# Patient Record
Sex: Female | Born: 1945 | Race: White | Hispanic: No | Smoking: Never smoker
Health system: Southern US, Community
[De-identification: ages and names within clinical notes are randomized; demographics above are authoritative.]

## PROBLEM LIST (undated history)

## (undated) DIAGNOSIS — K2100 Gastro-esophageal reflux disease with esophagitis, without bleeding: Secondary | ICD-10-CM

## (undated) DIAGNOSIS — F329 Major depressive disorder, single episode, unspecified: Secondary | ICD-10-CM

## (undated) DIAGNOSIS — R17 Unspecified jaundice: Secondary | ICD-10-CM

## (undated) DIAGNOSIS — C50411 Malignant neoplasm of upper-outer quadrant of right female breast: Secondary | ICD-10-CM

## (undated) DIAGNOSIS — Z8719 Personal history of other diseases of the digestive system: Secondary | ICD-10-CM

## (undated) DIAGNOSIS — E039 Hypothyroidism, unspecified: Secondary | ICD-10-CM

## (undated) DIAGNOSIS — F509 Eating disorder, unspecified: Secondary | ICD-10-CM

## (undated) DIAGNOSIS — M1990 Unspecified osteoarthritis, unspecified site: Secondary | ICD-10-CM

## (undated) DIAGNOSIS — Z8711 Personal history of peptic ulcer disease: Secondary | ICD-10-CM

## (undated) DIAGNOSIS — K859 Acute pancreatitis without necrosis or infection, unspecified: Secondary | ICD-10-CM

## (undated) DIAGNOSIS — Z9221 Personal history of antineoplastic chemotherapy: Secondary | ICD-10-CM

## (undated) DIAGNOSIS — F32A Depression, unspecified: Secondary | ICD-10-CM

## (undated) DIAGNOSIS — Z923 Personal history of irradiation: Secondary | ICD-10-CM

## (undated) HISTORY — DX: Major depressive disorder, single episode, unspecified: F32.9

## (undated) HISTORY — DX: Hypothyroidism, unspecified: E03.9

## (undated) HISTORY — DX: Acute pancreatitis without necrosis or infection, unspecified: K85.90

## (undated) HISTORY — PX: CHOLECYSTECTOMY: SHX55

## (undated) HISTORY — DX: Personal history of other diseases of the digestive system: Z87.19

## (undated) HISTORY — PX: BREAST LUMPECTOMY: SHX2

## (undated) HISTORY — PX: TONSILLECTOMY: SUR1361

## (undated) HISTORY — DX: Gastro-esophageal reflux disease with esophagitis: K21.0

## (undated) HISTORY — PX: THYROIDECTOMY: SHX17

## (undated) HISTORY — DX: Gastro-esophageal reflux disease with esophagitis, without bleeding: K21.00

## (undated) HISTORY — PX: EXPLORATORY LAPAROTOMY: SUR591

## (undated) HISTORY — PX: BREAST SURGERY: SHX581

## (undated) HISTORY — PX: MASTECTOMY: SHX3

## (undated) HISTORY — DX: Unspecified jaundice: R17

## (undated) HISTORY — DX: Eating disorder, unspecified: F50.9

## (undated) HISTORY — DX: Malignant neoplasm of upper-outer quadrant of right female breast: C50.411

## (undated) HISTORY — DX: Personal history of peptic ulcer disease: Z87.11

## (undated) HISTORY — DX: Unspecified osteoarthritis, unspecified site: M19.90

## (undated) HISTORY — DX: Depression, unspecified: F32.A

---

## 2007-05-08 DIAGNOSIS — K859 Acute pancreatitis without necrosis or infection, unspecified: Secondary | ICD-10-CM

## 2007-05-08 HISTORY — DX: Acute pancreatitis without necrosis or infection, unspecified: K85.90

## 2012-10-16 DIAGNOSIS — K219 Gastro-esophageal reflux disease without esophagitis: Secondary | ICD-10-CM | POA: Insufficient documentation

## 2012-10-16 DIAGNOSIS — F32A Depression, unspecified: Secondary | ICD-10-CM | POA: Insufficient documentation

## 2012-10-16 DIAGNOSIS — G894 Chronic pain syndrome: Secondary | ICD-10-CM | POA: Insufficient documentation

## 2012-10-16 DIAGNOSIS — L719 Rosacea, unspecified: Secondary | ICD-10-CM | POA: Insufficient documentation

## 2012-10-16 DIAGNOSIS — E039 Hypothyroidism, unspecified: Secondary | ICD-10-CM | POA: Insufficient documentation

## 2012-10-16 DIAGNOSIS — A6000 Herpesviral infection of urogenital system, unspecified: Secondary | ICD-10-CM | POA: Insufficient documentation

## 2013-05-14 DIAGNOSIS — F322 Major depressive disorder, single episode, severe without psychotic features: Secondary | ICD-10-CM | POA: Diagnosis not present

## 2013-05-21 DIAGNOSIS — F322 Major depressive disorder, single episode, severe without psychotic features: Secondary | ICD-10-CM | POA: Diagnosis not present

## 2013-05-28 DIAGNOSIS — F322 Major depressive disorder, single episode, severe without psychotic features: Secondary | ICD-10-CM | POA: Diagnosis not present

## 2013-05-29 DIAGNOSIS — R928 Other abnormal and inconclusive findings on diagnostic imaging of breast: Secondary | ICD-10-CM | POA: Diagnosis not present

## 2013-05-29 DIAGNOSIS — N63 Unspecified lump in unspecified breast: Secondary | ICD-10-CM | POA: Diagnosis not present

## 2013-06-04 DIAGNOSIS — N63 Unspecified lump in unspecified breast: Secondary | ICD-10-CM | POA: Diagnosis not present

## 2013-06-04 DIAGNOSIS — C50919 Malignant neoplasm of unspecified site of unspecified female breast: Secondary | ICD-10-CM | POA: Diagnosis not present

## 2013-06-11 DIAGNOSIS — C50419 Malignant neoplasm of upper-outer quadrant of unspecified female breast: Secondary | ICD-10-CM | POA: Diagnosis not present

## 2013-06-11 DIAGNOSIS — N63 Unspecified lump in unspecified breast: Secondary | ICD-10-CM | POA: Diagnosis not present

## 2013-06-12 DIAGNOSIS — C50919 Malignant neoplasm of unspecified site of unspecified female breast: Secondary | ICD-10-CM | POA: Diagnosis not present

## 2013-06-18 DIAGNOSIS — C50219 Malignant neoplasm of upper-inner quadrant of unspecified female breast: Secondary | ICD-10-CM | POA: Diagnosis not present

## 2013-06-18 DIAGNOSIS — F322 Major depressive disorder, single episode, severe without psychotic features: Secondary | ICD-10-CM | POA: Diagnosis not present

## 2013-06-25 DIAGNOSIS — N6489 Other specified disorders of breast: Secondary | ICD-10-CM | POA: Diagnosis not present

## 2013-06-25 DIAGNOSIS — C50919 Malignant neoplasm of unspecified site of unspecified female breast: Secondary | ICD-10-CM | POA: Diagnosis not present

## 2013-06-25 DIAGNOSIS — F322 Major depressive disorder, single episode, severe without psychotic features: Secondary | ICD-10-CM | POA: Diagnosis not present

## 2013-06-25 DIAGNOSIS — R928 Other abnormal and inconclusive findings on diagnostic imaging of breast: Secondary | ICD-10-CM | POA: Diagnosis not present

## 2013-07-02 DIAGNOSIS — F322 Major depressive disorder, single episode, severe without psychotic features: Secondary | ICD-10-CM | POA: Diagnosis not present

## 2013-07-03 DIAGNOSIS — F329 Major depressive disorder, single episode, unspecified: Secondary | ICD-10-CM | POA: Diagnosis not present

## 2013-07-03 DIAGNOSIS — C50919 Malignant neoplasm of unspecified site of unspecified female breast: Secondary | ICD-10-CM | POA: Diagnosis not present

## 2013-07-03 DIAGNOSIS — Z79899 Other long term (current) drug therapy: Secondary | ICD-10-CM | POA: Diagnosis not present

## 2013-07-03 DIAGNOSIS — Z452 Encounter for adjustment and management of vascular access device: Secondary | ICD-10-CM | POA: Diagnosis not present

## 2013-07-03 DIAGNOSIS — F3289 Other specified depressive episodes: Secondary | ICD-10-CM | POA: Diagnosis not present

## 2013-07-03 DIAGNOSIS — K219 Gastro-esophageal reflux disease without esophagitis: Secondary | ICD-10-CM | POA: Diagnosis not present

## 2013-07-03 DIAGNOSIS — C50419 Malignant neoplasm of upper-outer quadrant of unspecified female breast: Secondary | ICD-10-CM | POA: Diagnosis not present

## 2013-07-03 DIAGNOSIS — E039 Hypothyroidism, unspecified: Secondary | ICD-10-CM | POA: Diagnosis not present

## 2013-07-06 DIAGNOSIS — F329 Major depressive disorder, single episode, unspecified: Secondary | ICD-10-CM | POA: Diagnosis not present

## 2013-07-06 DIAGNOSIS — E860 Dehydration: Secondary | ICD-10-CM | POA: Diagnosis not present

## 2013-07-06 DIAGNOSIS — F3289 Other specified depressive episodes: Secondary | ICD-10-CM | POA: Diagnosis not present

## 2013-07-06 DIAGNOSIS — Z5111 Encounter for antineoplastic chemotherapy: Secondary | ICD-10-CM | POA: Diagnosis not present

## 2013-07-06 DIAGNOSIS — C50419 Malignant neoplasm of upper-outer quadrant of unspecified female breast: Secondary | ICD-10-CM | POA: Diagnosis not present

## 2013-07-06 DIAGNOSIS — IMO0002 Reserved for concepts with insufficient information to code with codable children: Secondary | ICD-10-CM | POA: Diagnosis not present

## 2013-07-06 DIAGNOSIS — E039 Hypothyroidism, unspecified: Secondary | ICD-10-CM | POA: Diagnosis not present

## 2013-07-09 DIAGNOSIS — C50419 Malignant neoplasm of upper-outer quadrant of unspecified female breast: Secondary | ICD-10-CM | POA: Diagnosis not present

## 2013-07-16 DIAGNOSIS — Z803 Family history of malignant neoplasm of breast: Secondary | ICD-10-CM | POA: Diagnosis not present

## 2013-07-17 DIAGNOSIS — C50419 Malignant neoplasm of upper-outer quadrant of unspecified female breast: Secondary | ICD-10-CM | POA: Diagnosis not present

## 2013-07-20 DIAGNOSIS — C50919 Malignant neoplasm of unspecified site of unspecified female breast: Secondary | ICD-10-CM | POA: Diagnosis not present

## 2013-07-23 DIAGNOSIS — F3289 Other specified depressive episodes: Secondary | ICD-10-CM | POA: Diagnosis not present

## 2013-07-23 DIAGNOSIS — Z5111 Encounter for antineoplastic chemotherapy: Secondary | ICD-10-CM | POA: Diagnosis not present

## 2013-07-23 DIAGNOSIS — Z9189 Other specified personal risk factors, not elsewhere classified: Secondary | ICD-10-CM | POA: Diagnosis not present

## 2013-07-23 DIAGNOSIS — E860 Dehydration: Secondary | ICD-10-CM | POA: Diagnosis not present

## 2013-07-23 DIAGNOSIS — IMO0002 Reserved for concepts with insufficient information to code with codable children: Secondary | ICD-10-CM | POA: Diagnosis not present

## 2013-07-23 DIAGNOSIS — C50419 Malignant neoplasm of upper-outer quadrant of unspecified female breast: Secondary | ICD-10-CM | POA: Diagnosis not present

## 2013-07-23 DIAGNOSIS — E039 Hypothyroidism, unspecified: Secondary | ICD-10-CM | POA: Diagnosis not present

## 2013-07-23 DIAGNOSIS — F329 Major depressive disorder, single episode, unspecified: Secondary | ICD-10-CM | POA: Diagnosis not present

## 2013-07-24 DIAGNOSIS — E039 Hypothyroidism, unspecified: Secondary | ICD-10-CM | POA: Diagnosis not present

## 2013-07-24 DIAGNOSIS — F3289 Other specified depressive episodes: Secondary | ICD-10-CM | POA: Diagnosis not present

## 2013-07-24 DIAGNOSIS — IMO0002 Reserved for concepts with insufficient information to code with codable children: Secondary | ICD-10-CM | POA: Diagnosis not present

## 2013-07-24 DIAGNOSIS — F329 Major depressive disorder, single episode, unspecified: Secondary | ICD-10-CM | POA: Diagnosis not present

## 2013-07-24 DIAGNOSIS — Z5111 Encounter for antineoplastic chemotherapy: Secondary | ICD-10-CM | POA: Diagnosis not present

## 2013-07-24 DIAGNOSIS — E860 Dehydration: Secondary | ICD-10-CM | POA: Diagnosis not present

## 2013-07-24 DIAGNOSIS — C50419 Malignant neoplasm of upper-outer quadrant of unspecified female breast: Secondary | ICD-10-CM | POA: Diagnosis not present

## 2013-07-30 DIAGNOSIS — E039 Hypothyroidism, unspecified: Secondary | ICD-10-CM | POA: Diagnosis not present

## 2013-07-30 DIAGNOSIS — R110 Nausea: Secondary | ICD-10-CM | POA: Diagnosis not present

## 2013-07-30 DIAGNOSIS — K121 Other forms of stomatitis: Secondary | ICD-10-CM | POA: Diagnosis not present

## 2013-07-30 DIAGNOSIS — Z5111 Encounter for antineoplastic chemotherapy: Secondary | ICD-10-CM | POA: Diagnosis not present

## 2013-07-30 DIAGNOSIS — E860 Dehydration: Secondary | ICD-10-CM | POA: Diagnosis not present

## 2013-07-30 DIAGNOSIS — F329 Major depressive disorder, single episode, unspecified: Secondary | ICD-10-CM | POA: Diagnosis not present

## 2013-07-30 DIAGNOSIS — IMO0002 Reserved for concepts with insufficient information to code with codable children: Secondary | ICD-10-CM | POA: Diagnosis not present

## 2013-07-30 DIAGNOSIS — K1230 Oral mucositis (ulcerative), unspecified: Secondary | ICD-10-CM | POA: Diagnosis not present

## 2013-07-30 DIAGNOSIS — F3289 Other specified depressive episodes: Secondary | ICD-10-CM | POA: Diagnosis not present

## 2013-07-30 DIAGNOSIS — C50419 Malignant neoplasm of upper-outer quadrant of unspecified female breast: Secondary | ICD-10-CM | POA: Diagnosis not present

## 2013-07-31 DIAGNOSIS — F322 Major depressive disorder, single episode, severe without psychotic features: Secondary | ICD-10-CM | POA: Diagnosis not present

## 2013-08-06 DIAGNOSIS — B373 Candidiasis of vulva and vagina: Secondary | ICD-10-CM | POA: Diagnosis not present

## 2013-08-06 DIAGNOSIS — IMO0002 Reserved for concepts with insufficient information to code with codable children: Secondary | ICD-10-CM | POA: Diagnosis not present

## 2013-08-06 DIAGNOSIS — B3731 Acute candidiasis of vulva and vagina: Secondary | ICD-10-CM | POA: Diagnosis not present

## 2013-08-06 DIAGNOSIS — T82898A Other specified complication of vascular prosthetic devices, implants and grafts, initial encounter: Secondary | ICD-10-CM | POA: Diagnosis not present

## 2013-08-06 DIAGNOSIS — C50419 Malignant neoplasm of upper-outer quadrant of unspecified female breast: Secondary | ICD-10-CM | POA: Diagnosis not present

## 2013-08-06 DIAGNOSIS — F322 Major depressive disorder, single episode, severe without psychotic features: Secondary | ICD-10-CM | POA: Diagnosis not present

## 2013-08-06 DIAGNOSIS — Z5111 Encounter for antineoplastic chemotherapy: Secondary | ICD-10-CM | POA: Diagnosis not present

## 2013-08-06 DIAGNOSIS — Z9189 Other specified personal risk factors, not elsewhere classified: Secondary | ICD-10-CM | POA: Diagnosis not present

## 2013-08-13 DIAGNOSIS — T82898A Other specified complication of vascular prosthetic devices, implants and grafts, initial encounter: Secondary | ICD-10-CM | POA: Diagnosis not present

## 2013-08-13 DIAGNOSIS — C50419 Malignant neoplasm of upper-outer quadrant of unspecified female breast: Secondary | ICD-10-CM | POA: Diagnosis not present

## 2013-08-13 DIAGNOSIS — B3731 Acute candidiasis of vulva and vagina: Secondary | ICD-10-CM | POA: Diagnosis not present

## 2013-08-13 DIAGNOSIS — B373 Candidiasis of vulva and vagina: Secondary | ICD-10-CM | POA: Diagnosis not present

## 2013-08-13 DIAGNOSIS — Z5111 Encounter for antineoplastic chemotherapy: Secondary | ICD-10-CM | POA: Diagnosis not present

## 2013-08-13 DIAGNOSIS — Z9189 Other specified personal risk factors, not elsewhere classified: Secondary | ICD-10-CM | POA: Diagnosis not present

## 2013-08-13 DIAGNOSIS — F322 Major depressive disorder, single episode, severe without psychotic features: Secondary | ICD-10-CM | POA: Diagnosis not present

## 2013-08-13 DIAGNOSIS — IMO0002 Reserved for concepts with insufficient information to code with codable children: Secondary | ICD-10-CM | POA: Diagnosis not present

## 2013-08-14 DIAGNOSIS — B373 Candidiasis of vulva and vagina: Secondary | ICD-10-CM | POA: Diagnosis not present

## 2013-08-14 DIAGNOSIS — C50419 Malignant neoplasm of upper-outer quadrant of unspecified female breast: Secondary | ICD-10-CM | POA: Diagnosis not present

## 2013-08-14 DIAGNOSIS — Z5111 Encounter for antineoplastic chemotherapy: Secondary | ICD-10-CM | POA: Diagnosis not present

## 2013-08-14 DIAGNOSIS — T82898A Other specified complication of vascular prosthetic devices, implants and grafts, initial encounter: Secondary | ICD-10-CM | POA: Diagnosis not present

## 2013-08-14 DIAGNOSIS — IMO0002 Reserved for concepts with insufficient information to code with codable children: Secondary | ICD-10-CM | POA: Diagnosis not present

## 2013-08-14 DIAGNOSIS — Z9189 Other specified personal risk factors, not elsewhere classified: Secondary | ICD-10-CM | POA: Diagnosis not present

## 2013-08-14 DIAGNOSIS — B3731 Acute candidiasis of vulva and vagina: Secondary | ICD-10-CM | POA: Diagnosis not present

## 2013-08-20 DIAGNOSIS — C50419 Malignant neoplasm of upper-outer quadrant of unspecified female breast: Secondary | ICD-10-CM | POA: Diagnosis not present

## 2013-08-20 DIAGNOSIS — B373 Candidiasis of vulva and vagina: Secondary | ICD-10-CM | POA: Diagnosis not present

## 2013-08-20 DIAGNOSIS — Z5111 Encounter for antineoplastic chemotherapy: Secondary | ICD-10-CM | POA: Diagnosis not present

## 2013-08-20 DIAGNOSIS — IMO0002 Reserved for concepts with insufficient information to code with codable children: Secondary | ICD-10-CM | POA: Diagnosis not present

## 2013-08-20 DIAGNOSIS — F322 Major depressive disorder, single episode, severe without psychotic features: Secondary | ICD-10-CM | POA: Diagnosis not present

## 2013-08-20 DIAGNOSIS — B3731 Acute candidiasis of vulva and vagina: Secondary | ICD-10-CM | POA: Diagnosis not present

## 2013-08-20 DIAGNOSIS — T82898A Other specified complication of vascular prosthetic devices, implants and grafts, initial encounter: Secondary | ICD-10-CM | POA: Diagnosis not present

## 2013-08-20 DIAGNOSIS — Z9189 Other specified personal risk factors, not elsewhere classified: Secondary | ICD-10-CM | POA: Diagnosis not present

## 2013-08-27 DIAGNOSIS — F322 Major depressive disorder, single episode, severe without psychotic features: Secondary | ICD-10-CM | POA: Diagnosis not present

## 2013-08-27 DIAGNOSIS — Z5111 Encounter for antineoplastic chemotherapy: Secondary | ICD-10-CM | POA: Diagnosis not present

## 2013-08-27 DIAGNOSIS — T82898A Other specified complication of vascular prosthetic devices, implants and grafts, initial encounter: Secondary | ICD-10-CM | POA: Diagnosis not present

## 2013-08-27 DIAGNOSIS — B3731 Acute candidiasis of vulva and vagina: Secondary | ICD-10-CM | POA: Diagnosis not present

## 2013-08-27 DIAGNOSIS — Z9189 Other specified personal risk factors, not elsewhere classified: Secondary | ICD-10-CM | POA: Diagnosis not present

## 2013-08-27 DIAGNOSIS — B373 Candidiasis of vulva and vagina: Secondary | ICD-10-CM | POA: Diagnosis not present

## 2013-08-27 DIAGNOSIS — C50419 Malignant neoplasm of upper-outer quadrant of unspecified female breast: Secondary | ICD-10-CM | POA: Diagnosis not present

## 2013-08-27 DIAGNOSIS — IMO0002 Reserved for concepts with insufficient information to code with codable children: Secondary | ICD-10-CM | POA: Diagnosis not present

## 2013-09-03 DIAGNOSIS — F322 Major depressive disorder, single episode, severe without psychotic features: Secondary | ICD-10-CM | POA: Diagnosis not present

## 2013-09-09 DIAGNOSIS — F322 Major depressive disorder, single episode, severe without psychotic features: Secondary | ICD-10-CM | POA: Diagnosis not present

## 2013-09-10 DIAGNOSIS — Z9189 Other specified personal risk factors, not elsewhere classified: Secondary | ICD-10-CM | POA: Diagnosis not present

## 2013-09-10 DIAGNOSIS — B373 Candidiasis of vulva and vagina: Secondary | ICD-10-CM | POA: Diagnosis not present

## 2013-09-10 DIAGNOSIS — B3731 Acute candidiasis of vulva and vagina: Secondary | ICD-10-CM | POA: Diagnosis not present

## 2013-09-10 DIAGNOSIS — C50419 Malignant neoplasm of upper-outer quadrant of unspecified female breast: Secondary | ICD-10-CM | POA: Diagnosis not present

## 2013-09-10 DIAGNOSIS — Z5111 Encounter for antineoplastic chemotherapy: Secondary | ICD-10-CM | POA: Diagnosis not present

## 2013-09-10 DIAGNOSIS — R21 Rash and other nonspecific skin eruption: Secondary | ICD-10-CM | POA: Diagnosis not present

## 2013-09-10 DIAGNOSIS — IMO0002 Reserved for concepts with insufficient information to code with codable children: Secondary | ICD-10-CM | POA: Diagnosis not present

## 2013-09-10 DIAGNOSIS — D709 Neutropenia, unspecified: Secondary | ICD-10-CM | POA: Diagnosis not present

## 2013-09-11 DIAGNOSIS — Z9189 Other specified personal risk factors, not elsewhere classified: Secondary | ICD-10-CM | POA: Diagnosis not present

## 2013-09-11 DIAGNOSIS — IMO0002 Reserved for concepts with insufficient information to code with codable children: Secondary | ICD-10-CM | POA: Diagnosis not present

## 2013-09-11 DIAGNOSIS — B3731 Acute candidiasis of vulva and vagina: Secondary | ICD-10-CM | POA: Diagnosis not present

## 2013-09-11 DIAGNOSIS — D709 Neutropenia, unspecified: Secondary | ICD-10-CM | POA: Diagnosis not present

## 2013-09-11 DIAGNOSIS — Z5111 Encounter for antineoplastic chemotherapy: Secondary | ICD-10-CM | POA: Diagnosis not present

## 2013-09-11 DIAGNOSIS — B373 Candidiasis of vulva and vagina: Secondary | ICD-10-CM | POA: Diagnosis not present

## 2013-09-11 DIAGNOSIS — C50419 Malignant neoplasm of upper-outer quadrant of unspecified female breast: Secondary | ICD-10-CM | POA: Diagnosis not present

## 2013-09-13 DIAGNOSIS — N390 Urinary tract infection, site not specified: Secondary | ICD-10-CM | POA: Diagnosis not present

## 2013-09-13 DIAGNOSIS — D72829 Elevated white blood cell count, unspecified: Secondary | ICD-10-CM | POA: Diagnosis not present

## 2013-09-13 DIAGNOSIS — K5900 Constipation, unspecified: Secondary | ICD-10-CM | POA: Diagnosis not present

## 2013-09-13 DIAGNOSIS — N949 Unspecified condition associated with female genital organs and menstrual cycle: Secondary | ICD-10-CM | POA: Diagnosis not present

## 2013-09-13 DIAGNOSIS — R109 Unspecified abdominal pain: Secondary | ICD-10-CM | POA: Diagnosis not present

## 2013-09-17 DIAGNOSIS — C50419 Malignant neoplasm of upper-outer quadrant of unspecified female breast: Secondary | ICD-10-CM | POA: Diagnosis not present

## 2013-09-17 DIAGNOSIS — Z5111 Encounter for antineoplastic chemotherapy: Secondary | ICD-10-CM | POA: Diagnosis not present

## 2013-09-17 DIAGNOSIS — Z9189 Other specified personal risk factors, not elsewhere classified: Secondary | ICD-10-CM | POA: Diagnosis not present

## 2013-09-17 DIAGNOSIS — IMO0002 Reserved for concepts with insufficient information to code with codable children: Secondary | ICD-10-CM | POA: Diagnosis not present

## 2013-09-17 DIAGNOSIS — F322 Major depressive disorder, single episode, severe without psychotic features: Secondary | ICD-10-CM | POA: Diagnosis not present

## 2013-09-17 DIAGNOSIS — B3731 Acute candidiasis of vulva and vagina: Secondary | ICD-10-CM | POA: Diagnosis not present

## 2013-09-17 DIAGNOSIS — B373 Candidiasis of vulva and vagina: Secondary | ICD-10-CM | POA: Diagnosis not present

## 2013-09-17 DIAGNOSIS — K5900 Constipation, unspecified: Secondary | ICD-10-CM | POA: Diagnosis not present

## 2013-09-17 DIAGNOSIS — D709 Neutropenia, unspecified: Secondary | ICD-10-CM | POA: Diagnosis not present

## 2013-09-23 DIAGNOSIS — IMO0002 Reserved for concepts with insufficient information to code with codable children: Secondary | ICD-10-CM | POA: Diagnosis not present

## 2013-09-23 DIAGNOSIS — Z9189 Other specified personal risk factors, not elsewhere classified: Secondary | ICD-10-CM | POA: Diagnosis not present

## 2013-09-23 DIAGNOSIS — D709 Neutropenia, unspecified: Secondary | ICD-10-CM | POA: Diagnosis not present

## 2013-09-23 DIAGNOSIS — B373 Candidiasis of vulva and vagina: Secondary | ICD-10-CM | POA: Diagnosis not present

## 2013-09-23 DIAGNOSIS — C50419 Malignant neoplasm of upper-outer quadrant of unspecified female breast: Secondary | ICD-10-CM | POA: Diagnosis not present

## 2013-09-23 DIAGNOSIS — Z5111 Encounter for antineoplastic chemotherapy: Secondary | ICD-10-CM | POA: Diagnosis not present

## 2013-09-23 DIAGNOSIS — B3731 Acute candidiasis of vulva and vagina: Secondary | ICD-10-CM | POA: Diagnosis not present

## 2013-09-30 DIAGNOSIS — B373 Candidiasis of vulva and vagina: Secondary | ICD-10-CM | POA: Diagnosis not present

## 2013-09-30 DIAGNOSIS — C50419 Malignant neoplasm of upper-outer quadrant of unspecified female breast: Secondary | ICD-10-CM | POA: Diagnosis not present

## 2013-09-30 DIAGNOSIS — IMO0002 Reserved for concepts with insufficient information to code with codable children: Secondary | ICD-10-CM | POA: Diagnosis not present

## 2013-09-30 DIAGNOSIS — B3731 Acute candidiasis of vulva and vagina: Secondary | ICD-10-CM | POA: Diagnosis not present

## 2013-09-30 DIAGNOSIS — D709 Neutropenia, unspecified: Secondary | ICD-10-CM | POA: Diagnosis not present

## 2013-09-30 DIAGNOSIS — Z5111 Encounter for antineoplastic chemotherapy: Secondary | ICD-10-CM | POA: Diagnosis not present

## 2013-09-30 DIAGNOSIS — Z9189 Other specified personal risk factors, not elsewhere classified: Secondary | ICD-10-CM | POA: Diagnosis not present

## 2013-10-01 DIAGNOSIS — R21 Rash and other nonspecific skin eruption: Secondary | ICD-10-CM | POA: Diagnosis not present

## 2013-10-01 DIAGNOSIS — B373 Candidiasis of vulva and vagina: Secondary | ICD-10-CM | POA: Diagnosis not present

## 2013-10-01 DIAGNOSIS — IMO0002 Reserved for concepts with insufficient information to code with codable children: Secondary | ICD-10-CM | POA: Diagnosis not present

## 2013-10-01 DIAGNOSIS — C50419 Malignant neoplasm of upper-outer quadrant of unspecified female breast: Secondary | ICD-10-CM | POA: Diagnosis not present

## 2013-10-01 DIAGNOSIS — D709 Neutropenia, unspecified: Secondary | ICD-10-CM | POA: Diagnosis not present

## 2013-10-01 DIAGNOSIS — B3731 Acute candidiasis of vulva and vagina: Secondary | ICD-10-CM | POA: Diagnosis not present

## 2013-10-01 DIAGNOSIS — Z5111 Encounter for antineoplastic chemotherapy: Secondary | ICD-10-CM | POA: Diagnosis not present

## 2013-10-01 DIAGNOSIS — Z9189 Other specified personal risk factors, not elsewhere classified: Secondary | ICD-10-CM | POA: Diagnosis not present

## 2013-10-08 DIAGNOSIS — B370 Candidal stomatitis: Secondary | ICD-10-CM | POA: Diagnosis not present

## 2013-10-08 DIAGNOSIS — Z5111 Encounter for antineoplastic chemotherapy: Secondary | ICD-10-CM | POA: Diagnosis not present

## 2013-10-08 DIAGNOSIS — B373 Candidiasis of vulva and vagina: Secondary | ICD-10-CM | POA: Diagnosis not present

## 2013-10-08 DIAGNOSIS — D709 Neutropenia, unspecified: Secondary | ICD-10-CM | POA: Diagnosis not present

## 2013-10-08 DIAGNOSIS — C50419 Malignant neoplasm of upper-outer quadrant of unspecified female breast: Secondary | ICD-10-CM | POA: Diagnosis not present

## 2013-10-08 DIAGNOSIS — B3731 Acute candidiasis of vulva and vagina: Secondary | ICD-10-CM | POA: Diagnosis not present

## 2013-10-08 DIAGNOSIS — IMO0002 Reserved for concepts with insufficient information to code with codable children: Secondary | ICD-10-CM | POA: Diagnosis not present

## 2013-10-21 DIAGNOSIS — F322 Major depressive disorder, single episode, severe without psychotic features: Secondary | ICD-10-CM | POA: Diagnosis not present

## 2013-10-22 DIAGNOSIS — Z5111 Encounter for antineoplastic chemotherapy: Secondary | ICD-10-CM | POA: Diagnosis not present

## 2013-10-22 DIAGNOSIS — M7500 Adhesive capsulitis of unspecified shoulder: Secondary | ICD-10-CM | POA: Diagnosis not present

## 2013-10-22 DIAGNOSIS — C50419 Malignant neoplasm of upper-outer quadrant of unspecified female breast: Secondary | ICD-10-CM | POA: Diagnosis not present

## 2013-10-22 DIAGNOSIS — IMO0002 Reserved for concepts with insufficient information to code with codable children: Secondary | ICD-10-CM | POA: Diagnosis not present

## 2013-10-23 DIAGNOSIS — L299 Pruritus, unspecified: Secondary | ICD-10-CM | POA: Diagnosis not present

## 2013-10-29 DIAGNOSIS — IMO0002 Reserved for concepts with insufficient information to code with codable children: Secondary | ICD-10-CM | POA: Diagnosis not present

## 2013-10-29 DIAGNOSIS — C50419 Malignant neoplasm of upper-outer quadrant of unspecified female breast: Secondary | ICD-10-CM | POA: Diagnosis not present

## 2013-10-29 DIAGNOSIS — Z5111 Encounter for antineoplastic chemotherapy: Secondary | ICD-10-CM | POA: Diagnosis not present

## 2013-11-12 DIAGNOSIS — B373 Candidiasis of vulva and vagina: Secondary | ICD-10-CM | POA: Diagnosis not present

## 2013-11-12 DIAGNOSIS — Z5111 Encounter for antineoplastic chemotherapy: Secondary | ICD-10-CM | POA: Diagnosis not present

## 2013-11-12 DIAGNOSIS — IMO0002 Reserved for concepts with insufficient information to code with codable children: Secondary | ICD-10-CM | POA: Diagnosis not present

## 2013-11-12 DIAGNOSIS — C50419 Malignant neoplasm of upper-outer quadrant of unspecified female breast: Secondary | ICD-10-CM | POA: Diagnosis not present

## 2013-11-12 DIAGNOSIS — B3731 Acute candidiasis of vulva and vagina: Secondary | ICD-10-CM | POA: Diagnosis not present

## 2013-11-12 DIAGNOSIS — R21 Rash and other nonspecific skin eruption: Secondary | ICD-10-CM | POA: Diagnosis not present

## 2013-11-19 DIAGNOSIS — C50419 Malignant neoplasm of upper-outer quadrant of unspecified female breast: Secondary | ICD-10-CM | POA: Diagnosis not present

## 2013-11-19 DIAGNOSIS — IMO0002 Reserved for concepts with insufficient information to code with codable children: Secondary | ICD-10-CM | POA: Diagnosis not present

## 2013-11-19 DIAGNOSIS — B373 Candidiasis of vulva and vagina: Secondary | ICD-10-CM | POA: Diagnosis not present

## 2013-11-19 DIAGNOSIS — F322 Major depressive disorder, single episode, severe without psychotic features: Secondary | ICD-10-CM | POA: Diagnosis not present

## 2013-11-19 DIAGNOSIS — R21 Rash and other nonspecific skin eruption: Secondary | ICD-10-CM | POA: Diagnosis not present

## 2013-11-19 DIAGNOSIS — Z5111 Encounter for antineoplastic chemotherapy: Secondary | ICD-10-CM | POA: Diagnosis not present

## 2013-11-19 DIAGNOSIS — B3731 Acute candidiasis of vulva and vagina: Secondary | ICD-10-CM | POA: Diagnosis not present

## 2013-11-26 DIAGNOSIS — B3731 Acute candidiasis of vulva and vagina: Secondary | ICD-10-CM | POA: Diagnosis not present

## 2013-11-26 DIAGNOSIS — R21 Rash and other nonspecific skin eruption: Secondary | ICD-10-CM | POA: Diagnosis not present

## 2013-11-26 DIAGNOSIS — IMO0002 Reserved for concepts with insufficient information to code with codable children: Secondary | ICD-10-CM | POA: Diagnosis not present

## 2013-11-26 DIAGNOSIS — Z5111 Encounter for antineoplastic chemotherapy: Secondary | ICD-10-CM | POA: Diagnosis not present

## 2013-11-26 DIAGNOSIS — B373 Candidiasis of vulva and vagina: Secondary | ICD-10-CM | POA: Diagnosis not present

## 2013-11-26 DIAGNOSIS — F322 Major depressive disorder, single episode, severe without psychotic features: Secondary | ICD-10-CM | POA: Diagnosis not present

## 2013-11-26 DIAGNOSIS — C50419 Malignant neoplasm of upper-outer quadrant of unspecified female breast: Secondary | ICD-10-CM | POA: Diagnosis not present

## 2013-12-03 DIAGNOSIS — B373 Candidiasis of vulva and vagina: Secondary | ICD-10-CM | POA: Diagnosis not present

## 2013-12-03 DIAGNOSIS — IMO0002 Reserved for concepts with insufficient information to code with codable children: Secondary | ICD-10-CM | POA: Diagnosis not present

## 2013-12-03 DIAGNOSIS — B3731 Acute candidiasis of vulva and vagina: Secondary | ICD-10-CM | POA: Diagnosis not present

## 2013-12-03 DIAGNOSIS — F322 Major depressive disorder, single episode, severe without psychotic features: Secondary | ICD-10-CM | POA: Diagnosis not present

## 2013-12-03 DIAGNOSIS — C50419 Malignant neoplasm of upper-outer quadrant of unspecified female breast: Secondary | ICD-10-CM | POA: Diagnosis not present

## 2013-12-03 DIAGNOSIS — Z5111 Encounter for antineoplastic chemotherapy: Secondary | ICD-10-CM | POA: Diagnosis not present

## 2013-12-03 DIAGNOSIS — R21 Rash and other nonspecific skin eruption: Secondary | ICD-10-CM | POA: Diagnosis not present

## 2013-12-10 DIAGNOSIS — IMO0002 Reserved for concepts with insufficient information to code with codable children: Secondary | ICD-10-CM | POA: Diagnosis not present

## 2013-12-10 DIAGNOSIS — M2550 Pain in unspecified joint: Secondary | ICD-10-CM | POA: Diagnosis not present

## 2013-12-10 DIAGNOSIS — Z5111 Encounter for antineoplastic chemotherapy: Secondary | ICD-10-CM | POA: Diagnosis not present

## 2013-12-10 DIAGNOSIS — B3731 Acute candidiasis of vulva and vagina: Secondary | ICD-10-CM | POA: Diagnosis not present

## 2013-12-10 DIAGNOSIS — R21 Rash and other nonspecific skin eruption: Secondary | ICD-10-CM | POA: Diagnosis not present

## 2013-12-10 DIAGNOSIS — B373 Candidiasis of vulva and vagina: Secondary | ICD-10-CM | POA: Diagnosis not present

## 2013-12-10 DIAGNOSIS — C50419 Malignant neoplasm of upper-outer quadrant of unspecified female breast: Secondary | ICD-10-CM | POA: Diagnosis not present

## 2013-12-17 DIAGNOSIS — Z5111 Encounter for antineoplastic chemotherapy: Secondary | ICD-10-CM | POA: Diagnosis not present

## 2013-12-17 DIAGNOSIS — B373 Candidiasis of vulva and vagina: Secondary | ICD-10-CM | POA: Diagnosis not present

## 2013-12-17 DIAGNOSIS — C50419 Malignant neoplasm of upper-outer quadrant of unspecified female breast: Secondary | ICD-10-CM | POA: Diagnosis not present

## 2013-12-17 DIAGNOSIS — L270 Generalized skin eruption due to drugs and medicaments taken internally: Secondary | ICD-10-CM | POA: Diagnosis not present

## 2013-12-17 DIAGNOSIS — M2550 Pain in unspecified joint: Secondary | ICD-10-CM | POA: Diagnosis not present

## 2013-12-17 DIAGNOSIS — IMO0002 Reserved for concepts with insufficient information to code with codable children: Secondary | ICD-10-CM | POA: Diagnosis not present

## 2013-12-17 DIAGNOSIS — F322 Major depressive disorder, single episode, severe without psychotic features: Secondary | ICD-10-CM | POA: Diagnosis not present

## 2013-12-17 DIAGNOSIS — R21 Rash and other nonspecific skin eruption: Secondary | ICD-10-CM | POA: Diagnosis not present

## 2013-12-17 DIAGNOSIS — B3731 Acute candidiasis of vulva and vagina: Secondary | ICD-10-CM | POA: Diagnosis not present

## 2013-12-17 DIAGNOSIS — L519 Erythema multiforme, unspecified: Secondary | ICD-10-CM | POA: Diagnosis not present

## 2013-12-18 DIAGNOSIS — Z5189 Encounter for other specified aftercare: Secondary | ICD-10-CM | POA: Diagnosis not present

## 2013-12-18 DIAGNOSIS — A499 Bacterial infection, unspecified: Secondary | ICD-10-CM | POA: Diagnosis not present

## 2013-12-18 DIAGNOSIS — B9689 Other specified bacterial agents as the cause of diseases classified elsewhere: Secondary | ICD-10-CM | POA: Diagnosis not present

## 2013-12-24 DIAGNOSIS — Z5111 Encounter for antineoplastic chemotherapy: Secondary | ICD-10-CM | POA: Diagnosis not present

## 2013-12-24 DIAGNOSIS — C50419 Malignant neoplasm of upper-outer quadrant of unspecified female breast: Secondary | ICD-10-CM | POA: Diagnosis not present

## 2013-12-24 DIAGNOSIS — B373 Candidiasis of vulva and vagina: Secondary | ICD-10-CM | POA: Diagnosis not present

## 2013-12-24 DIAGNOSIS — R21 Rash and other nonspecific skin eruption: Secondary | ICD-10-CM | POA: Diagnosis not present

## 2013-12-24 DIAGNOSIS — IMO0002 Reserved for concepts with insufficient information to code with codable children: Secondary | ICD-10-CM | POA: Diagnosis not present

## 2013-12-24 DIAGNOSIS — B3731 Acute candidiasis of vulva and vagina: Secondary | ICD-10-CM | POA: Diagnosis not present

## 2013-12-24 DIAGNOSIS — M2550 Pain in unspecified joint: Secondary | ICD-10-CM | POA: Diagnosis not present

## 2013-12-31 DIAGNOSIS — IMO0002 Reserved for concepts with insufficient information to code with codable children: Secondary | ICD-10-CM | POA: Diagnosis not present

## 2013-12-31 DIAGNOSIS — M2550 Pain in unspecified joint: Secondary | ICD-10-CM | POA: Diagnosis not present

## 2013-12-31 DIAGNOSIS — R21 Rash and other nonspecific skin eruption: Secondary | ICD-10-CM | POA: Diagnosis not present

## 2013-12-31 DIAGNOSIS — B3731 Acute candidiasis of vulva and vagina: Secondary | ICD-10-CM | POA: Diagnosis not present

## 2013-12-31 DIAGNOSIS — Z5111 Encounter for antineoplastic chemotherapy: Secondary | ICD-10-CM | POA: Diagnosis not present

## 2013-12-31 DIAGNOSIS — C50419 Malignant neoplasm of upper-outer quadrant of unspecified female breast: Secondary | ICD-10-CM | POA: Diagnosis not present

## 2013-12-31 DIAGNOSIS — B373 Candidiasis of vulva and vagina: Secondary | ICD-10-CM | POA: Diagnosis not present

## 2014-01-06 DIAGNOSIS — C50919 Malignant neoplasm of unspecified site of unspecified female breast: Secondary | ICD-10-CM | POA: Diagnosis not present

## 2014-01-06 DIAGNOSIS — T451X5A Adverse effect of antineoplastic and immunosuppressive drugs, initial encounter: Secondary | ICD-10-CM | POA: Diagnosis not present

## 2014-01-06 DIAGNOSIS — J90 Pleural effusion, not elsewhere classified: Secondary | ICD-10-CM | POA: Diagnosis not present

## 2014-01-06 DIAGNOSIS — D6481 Anemia due to antineoplastic chemotherapy: Secondary | ICD-10-CM | POA: Diagnosis not present

## 2014-01-06 DIAGNOSIS — G8929 Other chronic pain: Secondary | ICD-10-CM | POA: Diagnosis present

## 2014-01-06 DIAGNOSIS — R51 Headache: Secondary | ICD-10-CM | POA: Diagnosis not present

## 2014-01-06 DIAGNOSIS — K219 Gastro-esophageal reflux disease without esophagitis: Secondary | ICD-10-CM | POA: Diagnosis present

## 2014-01-06 DIAGNOSIS — D630 Anemia in neoplastic disease: Secondary | ICD-10-CM | POA: Diagnosis present

## 2014-01-06 DIAGNOSIS — R824 Acetonuria: Secondary | ICD-10-CM | POA: Diagnosis present

## 2014-01-06 DIAGNOSIS — F329 Major depressive disorder, single episode, unspecified: Secondary | ICD-10-CM | POA: Diagnosis present

## 2014-01-06 DIAGNOSIS — E871 Hypo-osmolality and hyponatremia: Secondary | ICD-10-CM | POA: Diagnosis present

## 2014-01-06 DIAGNOSIS — R930 Abnormal findings on diagnostic imaging of skull and head, not elsewhere classified: Secondary | ICD-10-CM | POA: Diagnosis not present

## 2014-01-06 DIAGNOSIS — M25559 Pain in unspecified hip: Secondary | ICD-10-CM | POA: Diagnosis not present

## 2014-01-06 DIAGNOSIS — Z79899 Other long term (current) drug therapy: Secondary | ICD-10-CM | POA: Diagnosis not present

## 2014-01-06 DIAGNOSIS — L719 Rosacea, unspecified: Secondary | ICD-10-CM | POA: Diagnosis present

## 2014-01-06 DIAGNOSIS — C50419 Malignant neoplasm of upper-outer quadrant of unspecified female breast: Secondary | ICD-10-CM | POA: Diagnosis not present

## 2014-01-06 DIAGNOSIS — R110 Nausea: Secondary | ICD-10-CM | POA: Diagnosis not present

## 2014-01-06 DIAGNOSIS — A6000 Herpesviral infection of urogenital system, unspecified: Secondary | ICD-10-CM | POA: Diagnosis present

## 2014-01-06 DIAGNOSIS — C7931 Secondary malignant neoplasm of brain: Secondary | ICD-10-CM | POA: Diagnosis not present

## 2014-01-06 DIAGNOSIS — E860 Dehydration: Secondary | ICD-10-CM | POA: Diagnosis not present

## 2014-01-06 DIAGNOSIS — R42 Dizziness and giddiness: Secondary | ICD-10-CM | POA: Diagnosis not present

## 2014-01-06 DIAGNOSIS — C7949 Secondary malignant neoplasm of other parts of nervous system: Secondary | ICD-10-CM | POA: Diagnosis not present

## 2014-01-06 DIAGNOSIS — F3289 Other specified depressive episodes: Secondary | ICD-10-CM | POA: Diagnosis present

## 2014-01-06 DIAGNOSIS — J189 Pneumonia, unspecified organism: Secondary | ICD-10-CM | POA: Diagnosis not present

## 2014-01-06 DIAGNOSIS — E039 Hypothyroidism, unspecified: Secondary | ICD-10-CM | POA: Diagnosis present

## 2014-01-06 DIAGNOSIS — Z171 Estrogen receptor negative status [ER-]: Secondary | ICD-10-CM | POA: Diagnosis not present

## 2014-01-06 DIAGNOSIS — R0602 Shortness of breath: Secondary | ICD-10-CM | POA: Diagnosis not present

## 2014-01-08 DIAGNOSIS — R9089 Other abnormal findings on diagnostic imaging of central nervous system: Secondary | ICD-10-CM | POA: Insufficient documentation

## 2014-01-14 DIAGNOSIS — F322 Major depressive disorder, single episode, severe without psychotic features: Secondary | ICD-10-CM | POA: Diagnosis not present

## 2014-01-15 DIAGNOSIS — B029 Zoster without complications: Secondary | ICD-10-CM | POA: Diagnosis not present

## 2014-01-15 DIAGNOSIS — Z9189 Other specified personal risk factors, not elsewhere classified: Secondary | ICD-10-CM | POA: Diagnosis not present

## 2014-01-15 DIAGNOSIS — E860 Dehydration: Secondary | ICD-10-CM | POA: Diagnosis not present

## 2014-01-15 DIAGNOSIS — C50419 Malignant neoplasm of upper-outer quadrant of unspecified female breast: Secondary | ICD-10-CM | POA: Diagnosis not present

## 2014-01-15 DIAGNOSIS — R509 Fever, unspecified: Secondary | ICD-10-CM | POA: Diagnosis not present

## 2014-01-18 DIAGNOSIS — C50419 Malignant neoplasm of upper-outer quadrant of unspecified female breast: Secondary | ICD-10-CM | POA: Diagnosis not present

## 2014-01-18 DIAGNOSIS — R509 Fever, unspecified: Secondary | ICD-10-CM | POA: Diagnosis not present

## 2014-01-18 DIAGNOSIS — E860 Dehydration: Secondary | ICD-10-CM | POA: Diagnosis not present

## 2014-01-18 DIAGNOSIS — Z9189 Other specified personal risk factors, not elsewhere classified: Secondary | ICD-10-CM | POA: Diagnosis not present

## 2014-01-18 DIAGNOSIS — B029 Zoster without complications: Secondary | ICD-10-CM | POA: Diagnosis not present

## 2014-01-18 DIAGNOSIS — J189 Pneumonia, unspecified organism: Secondary | ICD-10-CM | POA: Diagnosis not present

## 2014-01-19 DIAGNOSIS — C50419 Malignant neoplasm of upper-outer quadrant of unspecified female breast: Secondary | ICD-10-CM | POA: Diagnosis not present

## 2014-01-19 DIAGNOSIS — R509 Fever, unspecified: Secondary | ICD-10-CM | POA: Diagnosis not present

## 2014-01-19 DIAGNOSIS — Z9189 Other specified personal risk factors, not elsewhere classified: Secondary | ICD-10-CM | POA: Diagnosis not present

## 2014-01-19 DIAGNOSIS — E860 Dehydration: Secondary | ICD-10-CM | POA: Diagnosis not present

## 2014-01-19 DIAGNOSIS — B029 Zoster without complications: Secondary | ICD-10-CM | POA: Diagnosis not present

## 2014-01-28 DIAGNOSIS — Z9189 Other specified personal risk factors, not elsewhere classified: Secondary | ICD-10-CM | POA: Diagnosis not present

## 2014-01-28 DIAGNOSIS — E860 Dehydration: Secondary | ICD-10-CM | POA: Diagnosis not present

## 2014-01-28 DIAGNOSIS — J189 Pneumonia, unspecified organism: Secondary | ICD-10-CM | POA: Diagnosis not present

## 2014-01-28 DIAGNOSIS — Z853 Personal history of malignant neoplasm of breast: Secondary | ICD-10-CM | POA: Diagnosis not present

## 2014-01-28 DIAGNOSIS — R0602 Shortness of breath: Secondary | ICD-10-CM | POA: Diagnosis not present

## 2014-01-28 DIAGNOSIS — B029 Zoster without complications: Secondary | ICD-10-CM | POA: Diagnosis not present

## 2014-01-28 DIAGNOSIS — C50419 Malignant neoplasm of upper-outer quadrant of unspecified female breast: Secondary | ICD-10-CM | POA: Diagnosis not present

## 2014-01-28 DIAGNOSIS — R509 Fever, unspecified: Secondary | ICD-10-CM | POA: Diagnosis not present

## 2014-01-28 DIAGNOSIS — J984 Other disorders of lung: Secondary | ICD-10-CM | POA: Diagnosis not present

## 2014-01-29 DIAGNOSIS — B029 Zoster without complications: Secondary | ICD-10-CM | POA: Diagnosis not present

## 2014-01-29 DIAGNOSIS — R509 Fever, unspecified: Secondary | ICD-10-CM | POA: Diagnosis not present

## 2014-01-29 DIAGNOSIS — Z9189 Other specified personal risk factors, not elsewhere classified: Secondary | ICD-10-CM | POA: Diagnosis not present

## 2014-01-29 DIAGNOSIS — C50419 Malignant neoplasm of upper-outer quadrant of unspecified female breast: Secondary | ICD-10-CM | POA: Diagnosis not present

## 2014-01-29 DIAGNOSIS — E860 Dehydration: Secondary | ICD-10-CM | POA: Diagnosis not present

## 2014-02-03 DIAGNOSIS — R509 Fever, unspecified: Secondary | ICD-10-CM | POA: Diagnosis not present

## 2014-02-04 DIAGNOSIS — C50411 Malignant neoplasm of upper-outer quadrant of right female breast: Secondary | ICD-10-CM | POA: Diagnosis not present

## 2014-02-09 DIAGNOSIS — Z5111 Encounter for antineoplastic chemotherapy: Secondary | ICD-10-CM | POA: Diagnosis not present

## 2014-02-15 DIAGNOSIS — C50919 Malignant neoplasm of unspecified site of unspecified female breast: Secondary | ICD-10-CM | POA: Diagnosis not present

## 2014-02-18 DIAGNOSIS — F332 Major depressive disorder, recurrent severe without psychotic features: Secondary | ICD-10-CM | POA: Diagnosis not present

## 2014-02-18 DIAGNOSIS — C50411 Malignant neoplasm of upper-outer quadrant of right female breast: Secondary | ICD-10-CM | POA: Diagnosis not present

## 2014-02-19 DIAGNOSIS — Z79899 Other long term (current) drug therapy: Secondary | ICD-10-CM | POA: Diagnosis not present

## 2014-02-19 DIAGNOSIS — R928 Other abnormal and inconclusive findings on diagnostic imaging of breast: Secondary | ICD-10-CM | POA: Diagnosis not present

## 2014-02-19 DIAGNOSIS — C50411 Malignant neoplasm of upper-outer quadrant of right female breast: Secondary | ICD-10-CM | POA: Diagnosis not present

## 2014-02-19 DIAGNOSIS — C50911 Malignant neoplasm of unspecified site of right female breast: Secondary | ICD-10-CM | POA: Diagnosis not present

## 2014-02-19 DIAGNOSIS — E039 Hypothyroidism, unspecified: Secondary | ICD-10-CM | POA: Diagnosis not present

## 2014-02-25 DIAGNOSIS — Z853 Personal history of malignant neoplasm of breast: Secondary | ICD-10-CM | POA: Diagnosis not present

## 2014-02-25 DIAGNOSIS — C50411 Malignant neoplasm of upper-outer quadrant of right female breast: Secondary | ICD-10-CM | POA: Diagnosis not present

## 2014-03-04 DIAGNOSIS — F332 Major depressive disorder, recurrent severe without psychotic features: Secondary | ICD-10-CM | POA: Diagnosis not present

## 2014-03-04 DIAGNOSIS — C50411 Malignant neoplasm of upper-outer quadrant of right female breast: Secondary | ICD-10-CM | POA: Diagnosis not present

## 2014-03-11 DIAGNOSIS — C50411 Malignant neoplasm of upper-outer quadrant of right female breast: Secondary | ICD-10-CM | POA: Diagnosis not present

## 2014-03-11 DIAGNOSIS — F332 Major depressive disorder, recurrent severe without psychotic features: Secondary | ICD-10-CM | POA: Diagnosis not present

## 2014-03-12 DIAGNOSIS — C50411 Malignant neoplasm of upper-outer quadrant of right female breast: Secondary | ICD-10-CM | POA: Insufficient documentation

## 2014-03-18 DIAGNOSIS — F332 Major depressive disorder, recurrent severe without psychotic features: Secondary | ICD-10-CM | POA: Diagnosis not present

## 2014-03-18 DIAGNOSIS — M7501 Adhesive capsulitis of right shoulder: Secondary | ICD-10-CM | POA: Diagnosis not present

## 2014-03-18 DIAGNOSIS — M7502 Adhesive capsulitis of left shoulder: Secondary | ICD-10-CM | POA: Diagnosis not present

## 2014-03-25 DIAGNOSIS — C50411 Malignant neoplasm of upper-outer quadrant of right female breast: Secondary | ICD-10-CM | POA: Diagnosis not present

## 2014-03-31 DIAGNOSIS — C50411 Malignant neoplasm of upper-outer quadrant of right female breast: Secondary | ICD-10-CM | POA: Diagnosis not present

## 2014-04-06 DIAGNOSIS — C50411 Malignant neoplasm of upper-outer quadrant of right female breast: Secondary | ICD-10-CM | POA: Diagnosis not present

## 2014-04-07 DIAGNOSIS — C50411 Malignant neoplasm of upper-outer quadrant of right female breast: Secondary | ICD-10-CM | POA: Diagnosis not present

## 2014-04-08 DIAGNOSIS — C50411 Malignant neoplasm of upper-outer quadrant of right female breast: Secondary | ICD-10-CM | POA: Diagnosis not present

## 2014-04-08 DIAGNOSIS — F332 Major depressive disorder, recurrent severe without psychotic features: Secondary | ICD-10-CM | POA: Diagnosis not present

## 2014-04-09 DIAGNOSIS — C50411 Malignant neoplasm of upper-outer quadrant of right female breast: Secondary | ICD-10-CM | POA: Diagnosis not present

## 2014-04-12 DIAGNOSIS — C50411 Malignant neoplasm of upper-outer quadrant of right female breast: Secondary | ICD-10-CM | POA: Diagnosis not present

## 2014-04-13 DIAGNOSIS — C50411 Malignant neoplasm of upper-outer quadrant of right female breast: Secondary | ICD-10-CM | POA: Diagnosis not present

## 2014-04-14 DIAGNOSIS — C50411 Malignant neoplasm of upper-outer quadrant of right female breast: Secondary | ICD-10-CM | POA: Diagnosis not present

## 2014-04-15 DIAGNOSIS — C50411 Malignant neoplasm of upper-outer quadrant of right female breast: Secondary | ICD-10-CM | POA: Diagnosis not present

## 2014-04-16 DIAGNOSIS — C50411 Malignant neoplasm of upper-outer quadrant of right female breast: Secondary | ICD-10-CM | POA: Diagnosis not present

## 2014-04-19 DIAGNOSIS — C50411 Malignant neoplasm of upper-outer quadrant of right female breast: Secondary | ICD-10-CM | POA: Diagnosis not present

## 2014-04-20 DIAGNOSIS — C50411 Malignant neoplasm of upper-outer quadrant of right female breast: Secondary | ICD-10-CM | POA: Diagnosis not present

## 2014-04-21 DIAGNOSIS — C50411 Malignant neoplasm of upper-outer quadrant of right female breast: Secondary | ICD-10-CM | POA: Diagnosis not present

## 2014-04-22 DIAGNOSIS — N760 Acute vaginitis: Secondary | ICD-10-CM | POA: Diagnosis not present

## 2014-04-22 DIAGNOSIS — C50411 Malignant neoplasm of upper-outer quadrant of right female breast: Secondary | ICD-10-CM | POA: Diagnosis not present

## 2014-04-22 DIAGNOSIS — F332 Major depressive disorder, recurrent severe without psychotic features: Secondary | ICD-10-CM | POA: Diagnosis not present

## 2014-04-23 DIAGNOSIS — C50411 Malignant neoplasm of upper-outer quadrant of right female breast: Secondary | ICD-10-CM | POA: Diagnosis not present

## 2014-04-26 DIAGNOSIS — C50411 Malignant neoplasm of upper-outer quadrant of right female breast: Secondary | ICD-10-CM | POA: Diagnosis not present

## 2014-04-27 DIAGNOSIS — C50411 Malignant neoplasm of upper-outer quadrant of right female breast: Secondary | ICD-10-CM | POA: Diagnosis not present

## 2014-04-28 DIAGNOSIS — C50411 Malignant neoplasm of upper-outer quadrant of right female breast: Secondary | ICD-10-CM | POA: Diagnosis not present

## 2014-04-29 DIAGNOSIS — C50411 Malignant neoplasm of upper-outer quadrant of right female breast: Secondary | ICD-10-CM | POA: Diagnosis not present

## 2014-05-03 DIAGNOSIS — C50411 Malignant neoplasm of upper-outer quadrant of right female breast: Secondary | ICD-10-CM | POA: Diagnosis not present

## 2014-05-04 DIAGNOSIS — C50411 Malignant neoplasm of upper-outer quadrant of right female breast: Secondary | ICD-10-CM | POA: Diagnosis not present

## 2014-05-05 DIAGNOSIS — C50411 Malignant neoplasm of upper-outer quadrant of right female breast: Secondary | ICD-10-CM | POA: Diagnosis not present

## 2014-05-06 DIAGNOSIS — C50411 Malignant neoplasm of upper-outer quadrant of right female breast: Secondary | ICD-10-CM | POA: Diagnosis not present

## 2014-05-10 DIAGNOSIS — C50411 Malignant neoplasm of upper-outer quadrant of right female breast: Secondary | ICD-10-CM | POA: Diagnosis not present

## 2014-05-11 DIAGNOSIS — C50411 Malignant neoplasm of upper-outer quadrant of right female breast: Secondary | ICD-10-CM | POA: Diagnosis not present

## 2014-05-17 DIAGNOSIS — C50411 Malignant neoplasm of upper-outer quadrant of right female breast: Secondary | ICD-10-CM | POA: Diagnosis not present

## 2014-05-18 DIAGNOSIS — C50411 Malignant neoplasm of upper-outer quadrant of right female breast: Secondary | ICD-10-CM | POA: Diagnosis not present

## 2014-05-19 DIAGNOSIS — C50411 Malignant neoplasm of upper-outer quadrant of right female breast: Secondary | ICD-10-CM | POA: Diagnosis not present

## 2014-05-20 DIAGNOSIS — C50411 Malignant neoplasm of upper-outer quadrant of right female breast: Secondary | ICD-10-CM | POA: Diagnosis not present

## 2014-05-21 DIAGNOSIS — C50411 Malignant neoplasm of upper-outer quadrant of right female breast: Secondary | ICD-10-CM | POA: Diagnosis not present

## 2014-05-26 DIAGNOSIS — C50411 Malignant neoplasm of upper-outer quadrant of right female breast: Secondary | ICD-10-CM | POA: Diagnosis not present

## 2014-05-27 DIAGNOSIS — F332 Major depressive disorder, recurrent severe without psychotic features: Secondary | ICD-10-CM | POA: Diagnosis not present

## 2014-05-27 DIAGNOSIS — C50411 Malignant neoplasm of upper-outer quadrant of right female breast: Secondary | ICD-10-CM | POA: Diagnosis not present

## 2014-05-28 DIAGNOSIS — Z853 Personal history of malignant neoplasm of breast: Secondary | ICD-10-CM | POA: Diagnosis not present

## 2014-05-28 DIAGNOSIS — R51 Headache: Secondary | ICD-10-CM | POA: Diagnosis not present

## 2014-05-28 DIAGNOSIS — Z85828 Personal history of other malignant neoplasm of skin: Secondary | ICD-10-CM | POA: Diagnosis not present

## 2014-05-28 DIAGNOSIS — S2241XA Multiple fractures of ribs, right side, initial encounter for closed fracture: Secondary | ICD-10-CM | POA: Diagnosis not present

## 2014-05-28 DIAGNOSIS — Z882 Allergy status to sulfonamides status: Secondary | ICD-10-CM | POA: Diagnosis not present

## 2014-05-28 DIAGNOSIS — R52 Pain, unspecified: Secondary | ICD-10-CM | POA: Diagnosis not present

## 2014-05-28 DIAGNOSIS — S3991XA Unspecified injury of abdomen, initial encounter: Secondary | ICD-10-CM | POA: Diagnosis not present

## 2014-05-28 DIAGNOSIS — S20311A Abrasion of right front wall of thorax, initial encounter: Secondary | ICD-10-CM | POA: Diagnosis not present

## 2014-05-28 DIAGNOSIS — Z7982 Long term (current) use of aspirin: Secondary | ICD-10-CM | POA: Diagnosis not present

## 2014-05-28 DIAGNOSIS — S2231XA Fracture of one rib, right side, initial encounter for closed fracture: Secondary | ICD-10-CM | POA: Diagnosis not present

## 2014-05-28 DIAGNOSIS — Z885 Allergy status to narcotic agent status: Secondary | ICD-10-CM | POA: Diagnosis not present

## 2014-05-28 DIAGNOSIS — R079 Chest pain, unspecified: Secondary | ICD-10-CM | POA: Diagnosis not present

## 2014-06-01 DIAGNOSIS — C50411 Malignant neoplasm of upper-outer quadrant of right female breast: Secondary | ICD-10-CM | POA: Diagnosis not present

## 2014-06-02 DIAGNOSIS — C50411 Malignant neoplasm of upper-outer quadrant of right female breast: Secondary | ICD-10-CM | POA: Diagnosis not present

## 2014-06-03 DIAGNOSIS — C50411 Malignant neoplasm of upper-outer quadrant of right female breast: Secondary | ICD-10-CM | POA: Diagnosis not present

## 2014-06-03 DIAGNOSIS — F332 Major depressive disorder, recurrent severe without psychotic features: Secondary | ICD-10-CM | POA: Diagnosis not present

## 2014-06-07 DIAGNOSIS — C50411 Malignant neoplasm of upper-outer quadrant of right female breast: Secondary | ICD-10-CM | POA: Diagnosis not present

## 2014-06-08 DIAGNOSIS — C50411 Malignant neoplasm of upper-outer quadrant of right female breast: Secondary | ICD-10-CM | POA: Diagnosis not present

## 2014-06-09 DIAGNOSIS — C50411 Malignant neoplasm of upper-outer quadrant of right female breast: Secondary | ICD-10-CM | POA: Diagnosis not present

## 2014-06-10 DIAGNOSIS — C50411 Malignant neoplasm of upper-outer quadrant of right female breast: Secondary | ICD-10-CM | POA: Diagnosis not present

## 2014-06-10 DIAGNOSIS — N63 Unspecified lump in breast: Secondary | ICD-10-CM | POA: Diagnosis not present

## 2014-06-11 DIAGNOSIS — F332 Major depressive disorder, recurrent severe without psychotic features: Secondary | ICD-10-CM | POA: Diagnosis not present

## 2014-06-24 DIAGNOSIS — F332 Major depressive disorder, recurrent severe without psychotic features: Secondary | ICD-10-CM | POA: Diagnosis not present

## 2014-07-01 DIAGNOSIS — F332 Major depressive disorder, recurrent severe without psychotic features: Secondary | ICD-10-CM | POA: Diagnosis not present

## 2014-07-15 DIAGNOSIS — N63 Unspecified lump in breast: Secondary | ICD-10-CM | POA: Diagnosis not present

## 2014-07-15 DIAGNOSIS — N641 Fat necrosis of breast: Secondary | ICD-10-CM | POA: Diagnosis not present

## 2014-07-15 DIAGNOSIS — N61 Inflammatory disorders of breast: Secondary | ICD-10-CM | POA: Diagnosis not present

## 2014-07-22 DIAGNOSIS — F332 Major depressive disorder, recurrent severe without psychotic features: Secondary | ICD-10-CM | POA: Diagnosis not present

## 2014-07-23 DIAGNOSIS — N641 Fat necrosis of breast: Secondary | ICD-10-CM | POA: Diagnosis not present

## 2014-07-23 DIAGNOSIS — C50411 Malignant neoplasm of upper-outer quadrant of right female breast: Secondary | ICD-10-CM | POA: Diagnosis not present

## 2014-08-05 DIAGNOSIS — F332 Major depressive disorder, recurrent severe without psychotic features: Secondary | ICD-10-CM | POA: Diagnosis not present

## 2014-08-05 DIAGNOSIS — M7501 Adhesive capsulitis of right shoulder: Secondary | ICD-10-CM | POA: Diagnosis not present

## 2014-08-05 DIAGNOSIS — M7502 Adhesive capsulitis of left shoulder: Secondary | ICD-10-CM | POA: Diagnosis not present

## 2014-08-12 DIAGNOSIS — F332 Major depressive disorder, recurrent severe without psychotic features: Secondary | ICD-10-CM | POA: Diagnosis not present

## 2014-08-19 DIAGNOSIS — F332 Major depressive disorder, recurrent severe without psychotic features: Secondary | ICD-10-CM | POA: Diagnosis not present

## 2014-08-19 DIAGNOSIS — C50411 Malignant neoplasm of upper-outer quadrant of right female breast: Secondary | ICD-10-CM | POA: Diagnosis not present

## 2014-08-26 DIAGNOSIS — F332 Major depressive disorder, recurrent severe without psychotic features: Secondary | ICD-10-CM | POA: Diagnosis not present

## 2014-09-02 DIAGNOSIS — F332 Major depressive disorder, recurrent severe without psychotic features: Secondary | ICD-10-CM | POA: Diagnosis not present

## 2014-09-09 DIAGNOSIS — F332 Major depressive disorder, recurrent severe without psychotic features: Secondary | ICD-10-CM | POA: Diagnosis not present

## 2014-09-16 DIAGNOSIS — F332 Major depressive disorder, recurrent severe without psychotic features: Secondary | ICD-10-CM | POA: Diagnosis not present

## 2014-09-23 DIAGNOSIS — F332 Major depressive disorder, recurrent severe without psychotic features: Secondary | ICD-10-CM | POA: Diagnosis not present

## 2014-10-07 DIAGNOSIS — F332 Major depressive disorder, recurrent severe without psychotic features: Secondary | ICD-10-CM | POA: Diagnosis not present

## 2014-10-14 DIAGNOSIS — R928 Other abnormal and inconclusive findings on diagnostic imaging of breast: Secondary | ICD-10-CM | POA: Diagnosis not present

## 2014-10-14 DIAGNOSIS — F332 Major depressive disorder, recurrent severe without psychotic features: Secondary | ICD-10-CM | POA: Diagnosis not present

## 2014-10-14 DIAGNOSIS — Z853 Personal history of malignant neoplasm of breast: Secondary | ICD-10-CM | POA: Diagnosis not present

## 2014-10-14 DIAGNOSIS — N641 Fat necrosis of breast: Secondary | ICD-10-CM | POA: Diagnosis not present

## 2014-10-21 DIAGNOSIS — F332 Major depressive disorder, recurrent severe without psychotic features: Secondary | ICD-10-CM | POA: Diagnosis not present

## 2014-12-21 DIAGNOSIS — M7502 Adhesive capsulitis of left shoulder: Secondary | ICD-10-CM | POA: Diagnosis not present

## 2014-12-21 DIAGNOSIS — M7501 Adhesive capsulitis of right shoulder: Secondary | ICD-10-CM | POA: Diagnosis not present

## 2014-12-22 DIAGNOSIS — B029 Zoster without complications: Secondary | ICD-10-CM | POA: Diagnosis not present

## 2015-04-05 ENCOUNTER — Telehealth: Payer: Self-pay | Admitting: Oncology

## 2015-04-05 ENCOUNTER — Encounter: Payer: Self-pay | Admitting: *Deleted

## 2015-04-05 DIAGNOSIS — G5601 Carpal tunnel syndrome, right upper limb: Secondary | ICD-10-CM | POA: Diagnosis not present

## 2015-04-05 NOTE — Progress Notes (Signed)
Chauncey Cruel, MD  Cherylynn Ridges, RN; [facility] C[facility]: P [facility]    Phone Number: [phone]            Tiffany this patient needs a new pt slot as soon as we have the data from her prior MD (pathology, surgical notes, mammograms, labs, and clinic summaries)   Thank you!       Previous Messages     ----- Message -----   From: Cherylynn Ridges, RN   Sent: 03/30/2015  4:29 PM    To: [facility] Ne[facility]une, [facility]  Subject: New patient referral               Tiffany, call received from [name], D.O.B. 1946/03/12 to make an appointment with Dr. Jana Hakim. "Has referral been received from [facility] yet. Someone called from your office last week and did not leave a message."     "I moved here from [place]. Was due to see oncologist in October. I haven't had a mammogram since June 2016. I do not have an internist or dermatologist and would like Dr. Jana Hakim to make recommendations. I finished chemotherapy and 4 to 5 months RT. I usually receceive a mammogram every three months. Return number [phone]."  Will notify Dr. Jana Hakim as an Juluis Rainier.

## 2015-04-05 NOTE — Telephone Encounter (Signed)
new patient appt- left message for patient and gave appt for 12/1 @ 2:30 w/Dr. Jana Hakim.  Left contact information for patient to confirm message/appt

## 2015-04-06 ENCOUNTER — Telehealth: Payer: Self-pay | Admitting: Oncology

## 2015-04-06 ENCOUNTER — Other Ambulatory Visit: Payer: Self-pay

## 2015-04-06 DIAGNOSIS — Z171 Estrogen receptor negative status [ER-]: Secondary | ICD-10-CM

## 2015-04-06 DIAGNOSIS — C50411 Malignant neoplasm of upper-outer quadrant of right female breast: Secondary | ICD-10-CM | POA: Insufficient documentation

## 2015-04-06 DIAGNOSIS — C50919 Malignant neoplasm of unspecified site of unspecified female breast: Secondary | ICD-10-CM

## 2015-04-06 NOTE — Telephone Encounter (Signed)
Spoke with patient and confirm appt.

## 2015-04-07 ENCOUNTER — Other Ambulatory Visit (HOSPITAL_BASED_OUTPATIENT_CLINIC_OR_DEPARTMENT_OTHER): Payer: Medicare Other

## 2015-04-07 ENCOUNTER — Telehealth: Payer: Self-pay | Admitting: Oncology

## 2015-04-07 ENCOUNTER — Ambulatory Visit (HOSPITAL_BASED_OUTPATIENT_CLINIC_OR_DEPARTMENT_OTHER): Payer: Medicare Other | Admitting: Oncology

## 2015-04-07 ENCOUNTER — Encounter: Payer: Self-pay | Admitting: Oncology

## 2015-04-07 VITALS — BP 129/68 | HR 102 | Temp 98.9°F | Resp 18 | Ht 64.0 in | Wt 141.2 lb

## 2015-04-07 DIAGNOSIS — Z171 Estrogen receptor negative status [ER-]: Secondary | ICD-10-CM

## 2015-04-07 DIAGNOSIS — K2100 Gastro-esophageal reflux disease with esophagitis, without bleeding: Secondary | ICD-10-CM

## 2015-04-07 DIAGNOSIS — F32A Depression, unspecified: Secondary | ICD-10-CM

## 2015-04-07 DIAGNOSIS — C50411 Malignant neoplasm of upper-outer quadrant of right female breast: Secondary | ICD-10-CM

## 2015-04-07 DIAGNOSIS — E890 Postprocedural hypothyroidism: Secondary | ICD-10-CM

## 2015-04-07 DIAGNOSIS — F329 Major depressive disorder, single episode, unspecified: Secondary | ICD-10-CM | POA: Diagnosis not present

## 2015-04-07 DIAGNOSIS — C50919 Malignant neoplasm of unspecified site of unspecified female breast: Secondary | ICD-10-CM | POA: Diagnosis not present

## 2015-04-07 LAB — COMPREHENSIVE METABOLIC PANEL (CC13)
ALT: 33 U/L (ref 0–55)
AST: 33 U/L (ref 5–34)
Albumin: 4.1 g/dL (ref 3.5–5.0)
Alkaline Phosphatase: 122 U/L (ref 40–150)
Anion Gap: 10 mEq/L (ref 3–11)
BUN: 15 mg/dL (ref 7.0–26.0)
CALCIUM: 9.7 mg/dL (ref 8.4–10.4)
CHLORIDE: 107 meq/L (ref 98–109)
CO2: 24 meq/L (ref 22–29)
CREATININE: 0.8 mg/dL (ref 0.6–1.1)
EGFR: 75 mL/min/{1.73_m2} — ABNORMAL LOW (ref >90)
Glucose: 87 mg/dl (ref 70–140)
Potassium: 4 mEq/L (ref 3.5–5.1)
Sodium: 140 mEq/L (ref 136–145)
TOTAL PROTEIN: 7 g/dL (ref 6.4–8.3)
Total Bilirubin: 1.15 mg/dL (ref 0.20–1.20)

## 2015-04-07 LAB — CBC WITH DIFFERENTIAL/PLATELET
BASO%: 0.6 % (ref 0.0–2.0)
Basophils Absolute: 0 10*3/uL (ref 0.0–0.1)
EOS%: 1.9 % (ref 0.0–7.0)
Eosinophils Absolute: 0.1 10*3/uL (ref 0.0–0.5)
HEMATOCRIT: 43 % (ref 34.8–46.6)
HGB: 15 g/dL (ref 11.6–15.9)
LYMPH#: 1.6 10*3/uL (ref 0.9–3.3)
LYMPH%: 24.3 % (ref 14.0–49.7)
MCH: 32.3 pg (ref 25.1–34.0)
MCHC: 34.9 g/dL (ref 31.5–36.0)
MCV: 92.5 fL (ref 79.5–101.0)
MONO#: 0.5 10*3/uL (ref 0.1–0.9)
MONO%: 7.9 % (ref 0.0–14.0)
NEUT%: 65.3 % (ref 38.4–76.8)
NEUTROS ABS: 4.2 10*3/uL (ref 1.5–6.5)
Platelets: 228 10*3/uL (ref 145–400)
RBC: 4.65 10*6/uL (ref 3.70–5.45)
RDW: 13 % (ref 11.2–14.5)
WBC: 6.5 10*3/uL (ref 3.9–10.3)

## 2015-04-07 MED ORDER — CEPHALEXIN 500 MG PO CAPS: 500.0000 mg | ORAL_CAPSULE | Freq: Four times a day (QID) | ORAL | Status: DC

## 2015-04-07 NOTE — Progress Notes (Signed)
[place]  [phone] [phone]) [phone]     ID: Robin Valencia DOB: [birth year]  MR#: [id number]  [id number]  Patient Care Team: Chauncey Cruel, MD as Consulting Physician (Oncology) PCP: No primary care provider on file. GYN: SU:  OTHER MD: Loyal Jacobson MD  CHIEF COMPLAINT: Triple negative breast cancer  CURRENT TREATMENT: Observation   BREAST CANCER HISTORY: Robin Valencia tells me she palpated a change in her right breast January 2015. It was "very high, almost outside the breast". She contacted her primary care physician, underwent mammography and ultrasonography, (I do not have those records) and on 06/03/2013 underwent biopsy of a mass described as "T2 NX) in the upper outer quadrant of the right breast. The pathology from that procedure [facility] [id number]) showed an invasive ductal carcinoma, grade 3, triple negative, with an MIB-1 of 91%.  The patient was treated neoadjuvantly with cyclophosphamide and doxorubicin between 07/23/2013 and 09/30/2013. There were no dose reductions, but there were delays between the second and third cycle (4 weeks (and the third and fourth (3 weeks). This was followed by paclitaxel weekly 12, with no treatment delays, but dose reduction of 20% with the final 4 doses. This was completed 01/14/2014.  On 02/19/2014 the patient underwent right mastectomy and sentinel lymph node sampling, with the final pathology (CNS [id number]) showing a complete pathologic response in the breast (no invasive disease and no DCIS) and all 4 sentinel lymph nodes clear. The patient then proceeded to adjuvant radiation. I do not have the radiation records.  Her subsequent history is as detailed below  INTERVAL HISTORY: So was evaluated in the breast clinic 04/07/2015.  REVIEW OF SYSTEMS: She is still very new in this area, does not have a primary care or other ancillary physicians and has not developed a social life. She complains of chills but not  night sweats or hot flashes. She describes herself as sufficiently fatigued that it affects her activities. She notices this for example because she is sleeping "too much". She has seasonal sinus problems, including a sore throat and occasional mild epistaxis. She denies cough or shortness of breath. Sometimes her ankles swell. She bruises easily. She has back and joint pain which is not more persistent or intense than before. She is anxious and depressed. She had a "wire" placed in her right 4 head remotely and that is now infected she says. She is not exercising regularly. Aside from these issues a detailed review of systems today was noncontributory  PAST MEDICAL HISTORY: Past Medical History  Diagnosis Date  . Breast cancer of upper-outer quadrant of right female breast ([place])   . Pancreatitis 2009  . Hypothyroidism   . Depression   . Reflux esophagitis     PAST SURGICAL HISTORY: Past Surgical History  Procedure Laterality Date  . Cesarean section    . Cesarean section with bilateral tubal ligation    . Thyroidectomy    . Cholecystectomy    . Exploratory laparotomy    . Mastectomy Right     FAMILY HISTORY No family history on file. The patient's father died from a stroke at the age of 61. The patient's mother died at the age of 30. The patient had one brother, 2 sisters. One sister was diagnosed with breast cancer in her 30s. One maternal aunt was diagnosed with breast cancer in her 52s. There is no history of ovarian cancer in the family.  GYNECOLOGIC HISTORY:  No LMP recorded. Menarche age 59, first live birth age 58,  which the patient understands increases the risk of breast cancer. She went through the change of life in 1989, and used hormone replacement for approximately 10 years, which also increases the risk of breast cancer developing. 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 She is currently looking for a paralegal position. She is divorced, and lives by herself with 2 Robin Valencia. Her son Robin Valencia is a foreign service operative currently in [place]. He recently was married. The patient has no grandchildren. She attends a [employer].    ADVANCED DIRECTIVES: In place. The patient's son is her healthcare part turning   HEALTH MAINTENANCE: Social History  Substance Use Topics  . Smoking status: Not on file  . Smokeless tobacco: Not on file  . Alcohol Use: Not on file     Colonoscopy:  PAP:  Bone density:  Lipid panel:  Allergies  Allergen Reactions  . Codeine Nausea And Vomiting  . Sulfa Antibiotics Diarrhea    Current Outpatient Prescriptions  Medication Sig Dispense Refill  . buPROPion (WELLBUTRIN XL) 150 MG 24 hr tablet Take by mouth.    . escitalopram (LEXAPRO) 10 MG tablet Take 15 mg by mouth.    . levothyroxine (SYNTHROID, LEVOTHROID) 100 MCG tablet Take by mouth.    . lisdexamfetamine (VYVANSE) 50 MG capsule Take 70 mg by mouth.    Marland Kitchen omeprazole (PRILOSEC) 10 MG capsule Take 20 mg by mouth.    . cephALEXin (KEFLEX) 500 MG capsule Take 1 capsule (500 mg total) by mouth 2 (two) times daily. 14 capsule 0  . cyclobenzaprine (FLEXERIL) 10 MG tablet Take by mouth.    . valACYclovir (VALTREX) 1000 MG tablet Take by mouth.     No current facility-administered medications for this visit.    OBJECTIVE: Middle-aged white woman who appears stated age 19 Vitals:   04/07/15 1523  BP: 129/68  Pulse: 102  Temp: 98.9 F (37.2 C)  Resp: 18     Body mass index is 24.23 kg/(m^2).    ECOG FS:1 - Symptomatic but completely ambulatory  Ocular: Sclerae unicteric, pupils equal, round and reactive to light Ear-nose-throat: Oropharynx clear and moist Lymphatic: No cervical or supraclavicular adenopathy Lungs no rales or rhonchi, good excursion bilaterally Heart regular rate and rhythm, no murmur appreciated Abd soft,  nontender, positive bowel sounds MSK no focal spinal tenderness, no right upper extremity edema Neuro: non-focal, well-oriented, appropriate affect Breasts: The right breast is status post lumpectomy and radiation. There is no evidence of local recurrence. The right axilla is benign. The left breast is unremarkable Skin: On the right 4 head area there is some skin breakdown and inflammation which she tells me is chronic. This is related to prior instrumentation.  LAB RESULTS:  CMP     Component Value Date/Time   NA 140 04/07/2015 1503   K 4.0 04/07/2015 1503   CO2 24 04/07/2015 1503   GLUCOSE 87 04/07/2015 1503   BUN 15.0 04/07/2015 1503   CREATININE 0.8 04/07/2015 1503   CALCIUM 9.7 04/07/2015 1503   PROT 7.0 04/07/2015 1503   ALBUMIN 4.1 04/07/2015 1503   AST 33 04/07/2015 1503   ALT 33 04/07/2015 1503   ALKPHOS 122 04/07/2015 1503   BILITOT 1.15 04/07/2015 1503    INo results found for: SPEP, UPEP  Lab Results  Component Value Date   WBC 6.5 04/07/2015   NEUTROABS 4.2 04/07/2015  HGB 15.0 04/07/2015   HCT 43.0 04/07/2015   MCV 92.5 04/07/2015   PLT 228 04/07/2015      Chemistry      Component Value Date/Time   NA 140 04/07/2015 1503   K 4.0 04/07/2015 1503   CO2 24 04/07/2015 1503   BUN 15.0 04/07/2015 1503   CREATININE 0.8 04/07/2015 1503      Component Value Date/Time   CALCIUM 9.7 04/07/2015 1503   ALKPHOS 122 04/07/2015 1503   AST 33 04/07/2015 1503   ALT 33 04/07/2015 1503   BILITOT 1.15 04/07/2015 1503       Lab Results  Component Value Date   LABCA2 30 04/07/2015    No components found for: [id number]  No results for input(s): INR in the last 168 hours.  Urinalysis No results found for: COLORURINE, APPEARANCEUR, LABSPEC, PHURINE, GLUCOSEU, HGBUR, BILIRUBINUR, KETONESUR, PROTEINUR, UROBILINOGEN, NITRITE, LEUKOCYTESUR  STUDIES: No results found.  ASSESSMENT: 69 y.o. [place], [place] woman status post right breast upper outer quadrant biopsy  06/04/2013 for a clinical T2 N0, stage IIA invasive ductal carcinoma, triple negative, with an MIB-1 of 91%  (1) neoadjuvant chemotherapy consisted of doxorubicin and cyclophosphamide 4 followed by weekly Taxol 12  (a) some treatment delays with doxorubicin/cyclophosphamide cycles  (b) 20% dose reduction final for Taxol cycles  (2) status post right lumpectomy and sentinel lymph node sampling 02/19/2014 showing a complete pathologic response (ypT0, ypN0)  (3) status post adjuvant radiation  (4) genetics counseling pending  PLAN: We spent the better part of today's hour-long appointment discussing the biology of breast cancer in general, and the specifics of the patient's tumor in particular. We spent quite a bit of time reviewing her prior records so she would have a good understanding of her treatment, surgical results, and prognosis. Basically she received standard of care neoadjuvant treatment and achieved a complete pathologic response. Triple negative patients who achieve a complete pathologic response have a 90% chance of disease-free survival 5 years out from surgery. This is obviously very favorable. She was very pleased to review this data again.  She then received her radiation treatments. She tolerated those well.  We discussed the fact that there is no further local or systemic treatment and that she is beginning long-term follow-up. In general we see patient's like her every 3 months for an additional year and then every 6 months until she completes 5 years of follow-up, at which time she can transition to our survivorship program.  She will have yearly mammography with tomography. This is scheduled for April.  She needs to be established with local physicians and I have put referrals on her behalf for internal medicine, psychiatry, and dermatology.  Given her right forehead infection I am starting her on cephalexin for a week. She will let us know if that does not clear the  problem. I also referred her to the [place] program. Finally I gave her a copy of this supportive services we have here so she can begin to develop a social life in this area.  Philip has a good understanding of the overall plan. She agrees with it. She knows the goal of treatment in her case is cure. She will call with any problems that may develop before her next visit here.  Chauncey Cruel, MD   04/08/2015 1:51 PM Medical Oncology and Hematology [facility] [address] [place], [place] [address] Tel. [phone]    Fax. [phone]

## 2015-04-07 NOTE — Telephone Encounter (Signed)
Appointments made and avs printed for patient.  Patient mentioned that he was going to put in a referral for primary care at [facility] and dr tafeen for a scalp issue.  She has been given both numbers and will call for her own appts

## 2015-04-08 ENCOUNTER — Telehealth: Payer: Self-pay | Admitting: Oncology

## 2015-04-08 ENCOUNTER — Encounter: Payer: Self-pay | Admitting: Oncology

## 2015-04-08 ENCOUNTER — Other Ambulatory Visit: Payer: Self-pay | Admitting: Oncology

## 2015-04-08 ENCOUNTER — Other Ambulatory Visit: Payer: Self-pay

## 2015-04-08 DIAGNOSIS — K2100 Gastro-esophageal reflux disease with esophagitis, without bleeding: Secondary | ICD-10-CM | POA: Insufficient documentation

## 2015-04-08 DIAGNOSIS — E890 Postprocedural hypothyroidism: Secondary | ICD-10-CM | POA: Insufficient documentation

## 2015-04-08 DIAGNOSIS — C50411 Malignant neoplasm of upper-outer quadrant of right female breast: Secondary | ICD-10-CM

## 2015-04-08 DIAGNOSIS — F329 Major depressive disorder, single episode, unspecified: Secondary | ICD-10-CM | POA: Insufficient documentation

## 2015-04-08 LAB — CANCER ANTIGEN 27.29: CA 27.29: 30 U/mL (ref 0–39)

## 2015-04-08 MED ORDER — CEPHALEXIN 500 MG PO CAPS: 500.0000 mg | ORAL_CAPSULE | Freq: Four times a day (QID) | ORAL | Status: DC

## 2015-04-08 MED ORDER — CEPHALEXIN 500 MG PO CAPS: 500.0000 mg | ORAL_CAPSULE | Freq: Two times a day (BID) | ORAL | Status: DC

## 2015-04-08 NOTE — Telephone Encounter (Signed)
Mailed a genetics calendar along with the number to call dr cottle

## 2015-04-11 DIAGNOSIS — D485 Neoplasm of uncertain behavior of skin: Secondary | ICD-10-CM | POA: Diagnosis not present

## 2015-04-11 DIAGNOSIS — L98499 Non-pressure chronic ulcer of skin of other sites with unspecified severity: Secondary | ICD-10-CM | POA: Diagnosis not present

## 2015-04-11 DIAGNOSIS — L570 Actinic keratosis: Secondary | ICD-10-CM | POA: Diagnosis not present

## 2015-04-13 DIAGNOSIS — G5601 Carpal tunnel syndrome, right upper limb: Secondary | ICD-10-CM | POA: Diagnosis not present

## 2015-05-04 ENCOUNTER — Encounter (HOSPITAL_COMMUNITY): Payer: Self-pay | Admitting: *Deleted

## 2015-05-04 ENCOUNTER — Emergency Department (HOSPITAL_COMMUNITY): Payer: Medicare Other

## 2015-05-04 ENCOUNTER — Emergency Department (HOSPITAL_COMMUNITY)
Admission: EM | Admit: 2015-05-04 | Discharge: 2015-05-05 | Disposition: A | Discharge status: Home / Self Care | Payer: Medicare Other | Attending: Emergency Medicine | Admitting: Emergency Medicine

## 2015-05-04 DIAGNOSIS — Z853 Personal history of malignant neoplasm of breast: Secondary | ICD-10-CM | POA: Diagnosis not present

## 2015-05-04 DIAGNOSIS — Z79899 Other long term (current) drug therapy: Secondary | ICD-10-CM | POA: Diagnosis not present

## 2015-05-04 DIAGNOSIS — F329 Major depressive disorder, single episode, unspecified: Secondary | ICD-10-CM | POA: Diagnosis not present

## 2015-05-04 DIAGNOSIS — R112 Nausea with vomiting, unspecified: Secondary | ICD-10-CM | POA: Diagnosis not present

## 2015-05-04 DIAGNOSIS — R197 Diarrhea, unspecified: Secondary | ICD-10-CM

## 2015-05-04 DIAGNOSIS — N390 Urinary tract infection, site not specified: Secondary | ICD-10-CM

## 2015-05-04 DIAGNOSIS — I951 Orthostatic hypotension: Secondary | ICD-10-CM | POA: Insufficient documentation

## 2015-05-04 DIAGNOSIS — E039 Hypothyroidism, unspecified: Secondary | ICD-10-CM | POA: Insufficient documentation

## 2015-05-04 DIAGNOSIS — E860 Dehydration: Secondary | ICD-10-CM | POA: Diagnosis not present

## 2015-05-04 DIAGNOSIS — R1010 Upper abdominal pain, unspecified: Secondary | ICD-10-CM

## 2015-05-04 DIAGNOSIS — R42 Dizziness and giddiness: Secondary | ICD-10-CM

## 2015-05-04 DIAGNOSIS — K529 Noninfective gastroenteritis and colitis, unspecified: Secondary | ICD-10-CM

## 2015-05-04 LAB — I-STAT TROPONIN, ED: Troponin i, poc: 0.01 ng/mL (ref 0.00–0.08)

## 2015-05-04 LAB — BASIC METABOLIC PANEL
ANION GAP: 14 (ref 5–15)
BUN: 24 mg/dL — ABNORMAL HIGH (ref 6–20)
CALCIUM: 9.8 mg/dL (ref 8.9–10.3)
CO2: 22 mmol/L (ref 22–32)
Chloride: 104 mmol/L (ref 101–111)
Creatinine, Ser: 0.81 mg/dL (ref 0.44–1.00)
GFR calc Af Amer: >60 mL/min (ref >60)
GFR calc non Af Amer: >60 mL/min (ref >60)
GLUCOSE: 107 mg/dL — AB (ref 65–99)
Potassium: 3.6 mmol/L (ref 3.5–5.1)
Sodium: 140 mmol/L (ref 135–145)

## 2015-05-04 LAB — HEPATIC FUNCTION PANEL
ALK PHOS: 114 U/L (ref 38–126)
ALT: 27 U/L (ref 14–54)
AST: 30 U/L (ref 15–41)
Albumin: 4.4 g/dL (ref 3.5–5.0)
BILIRUBIN DIRECT: 0.2 mg/dL (ref 0.1–0.5)
BILIRUBIN INDIRECT: 1.3 mg/dL — AB (ref 0.3–0.9)
Total Bilirubin: 1.5 mg/dL — ABNORMAL HIGH (ref 0.3–1.2)
Total Protein: 6.8 g/dL (ref 6.5–8.1)

## 2015-05-04 LAB — CBC
HEMATOCRIT: 46.1 % — AB (ref 36.0–46.0)
HEMOGLOBIN: 16.5 g/dL — AB (ref 12.0–15.0)
MCH: 32.1 pg (ref 26.0–34.0)
MCHC: 35.8 g/dL (ref 30.0–36.0)
MCV: 89.7 fL (ref 78.0–100.0)
Platelets: 190 10*3/uL (ref 150–400)
RBC: 5.14 MIL/uL — ABNORMAL HIGH (ref 3.87–5.11)
RDW: 13.1 % (ref 11.5–15.5)
WBC: 7.8 10*3/uL (ref 4.0–10.5)

## 2015-05-04 LAB — LIPASE, BLOOD: LIPASE: 32 U/L (ref 11–51)

## 2015-05-04 MED ORDER — DICYCLOMINE HCL 10 MG PO CAPS
20.0000 mg | ORAL_CAPSULE | Freq: Once | ORAL | Status: AC
  Administered 2015-05-05: 20 mg via ORAL
  Filled 2015-05-04: qty 2

## 2015-05-04 MED ORDER — ONDANSETRON HCL 4 MG/2ML IJ SOLN
4.0000 mg | Freq: Once | INTRAMUSCULAR | Status: AC
  Administered 2015-05-04: 4 mg via INTRAVENOUS
  Filled 2015-05-04: qty 2

## 2015-05-04 MED ORDER — SODIUM CHLORIDE 0.9 % IV BOLUS (SEPSIS)
1000.0000 mL | Freq: Once | INTRAVENOUS | Status: AC
  Administered 2015-05-04: 1000 mL via INTRAVENOUS

## 2015-05-04 NOTE — ED Provider Notes (Signed)
CSN: [id number]     Arrival date & time 05/04/15  1737 History   First MD Initiated Contact with Patient 05/04/15 2122     Chief Complaint  Patient presents with  . Emesis  . Dizziness     (Consider location/radiation/quality/duration/timing/severity/associated sxs/prior Treatment) HPI Comments: Robin Valencia is a 69 y.o. female with a PMHx of reflux esophagitis, GERD, depression, hypothyroidism, pancreatitis, and remote breast cancer of R upper outer quadrant s/p mastectomy/chemo/radiation currently in remission, with a PSHx of multiple C/S with BTL, cholecystectomy, exlap, and thyroidectomy, who presents to the ED with complaints of nausea, vomiting, and diarrhea that began around 11 AM. She reports she has had multiple episodes of nonbloody nonbilious emesis, cannot recall exact number of times but states that it "every hour". She has also had "a lot" of diarrhea, unsure of exact number but she thinks it could be approximately 20 episodes of watery nonbloody diarrhea. Around 3 PM she started to feel generalized weakness due to the constant n/v/d, and developed "a little" shortness of breath with exertion and lightheadedness with standing. She also reports that she has had some abdominal pain which she describes as 8/10 intermittent crampy upper abdominal pain, nonradiating, worse with vomiting, with no treatments tried prior to arrival.   She denies any fevers, chills, diaphoresis, leg swelling, recent travel/surgery/immobilization, history of DVT/PE, constipation, melena, hematochezia, hematemesis, dysuria, hematuria, vaginal bleeding or discharge, numbness, tingling, weakness, vertiginous dizziness, or vision changes. Denies any room spinning sensation. She denies any alcohol use, chronic NSAID use, recent travel, suspicious food intake, recent abx use, or sick contacts. She has had multiple prior abdominal surgeries.  Patient is a 69 y.o. female presenting with vomiting and dizziness. The  history is provided by the patient. No language interpreter was used.  Emesis Severity:  Moderate Duration:  11 hours Timing:  Constant Number of daily episodes:  "every hour" unable to specify exact amount Quality:  Stomach contents Progression:  Unchanged Chronicity:  New Recent urination:  Normal Relieved by:  None tried Worsened by:  Nothing tried Ineffective treatments:  None tried Associated symptoms: abdominal pain and diarrhea   Associated symptoms: no arthralgias, no chills and no myalgias   Risk factors: prior abdominal surgery   Risk factors: no sick contacts, no suspect food intake and no travel to endemic areas   Dizziness Associated symptoms: diarrhea, nausea, shortness of breath (with exertion) and vomiting   Associated symptoms: no blood in stool, no chest pain and no weakness     Past Medical History  Diagnosis Date  . Breast cancer of upper-outer quadrant of right female breast ([place])   . Pancreatitis 2009  . Hypothyroidism   . Depression   . Reflux esophagitis    Past Surgical History  Procedure Laterality Date  . Cesarean section    . Cesarean section with bilateral tubal ligation    . Thyroidectomy    . Cholecystectomy    . Exploratory laparotomy    . Mastectomy Right    No family history on file. Social History  Substance Use Topics  . Smoking status: Never Smoker   . Smokeless tobacco: None  . Alcohol Use: None   OB History    No data available     Review of Systems  Constitutional: Negative for fever, chills and diaphoresis.  Eyes: Negative for visual disturbance.  Respiratory: Positive for shortness of breath (with exertion).   Cardiovascular: Negative for chest pain and leg swelling.  Gastrointestinal: Positive for nausea, vomiting, abdominal  pain and diarrhea. Negative for constipation and blood in stool.  Genitourinary: Negative for dysuria, hematuria, vaginal bleeding and vaginal discharge.  Musculoskeletal: Negative for myalgias  and arthralgias.  Skin: Negative for color change.  Allergic/Immunologic: Negative for immunocompromised state.  Neurological: Positive for dizziness and light-headedness (with standing). Negative for syncope, weakness and numbness.  Psychiatric/Behavioral: Negative for confusion.   10 Systems reviewed and are negative for acute change except as noted in the HPI.    Allergies  Codeine and Sulfa antibiotics  Home Medications   Prior to Admission medications   Medication Sig Start Date End Date Taking? Authorizing Provider  buPROPion (WELLBUTRIN XL) 150 MG 24 hr tablet Take by mouth.    Historical Provider, MD  cephALEXin (KEFLEX) 500 MG capsule Take 1 capsule (500 mg total) by mouth 2 (two) times daily. 04/08/15   Chauncey Cruel, MD  cyclobenzaprine (FLEXERIL) 10 MG tablet Take by mouth.    Historical Provider, MD  escitalopram (LEXAPRO) 10 MG tablet Take 15 mg by mouth.    Historical Provider, MD  levothyroxine (SYNTHROID, LEVOTHROID) 100 MCG tablet Take by mouth.    Historical Provider, MD  lisdexamfetamine (VYVANSE) 50 MG capsule Take 70 mg by mouth.    Historical Provider, MD  omeprazole (PRILOSEC) 10 MG capsule Take 20 mg by mouth.    Historical Provider, MD  valACYclovir (VALTREX) 1000 MG tablet Take by mouth. 01/11/14   Historical Provider, MD   BP 130/74 mmHg  Pulse 96  Temp(Src) 97.4 F (36.3 C) (Oral)  Resp 16  SpO2 100% Physical Exam  Constitutional: She is oriented to person, place, and time. Vital signs are normal. She appears well-developed and well-nourished.  Non-toxic appearance. No distress.  Afebrile, nontoxic, NAD  HENT:  Head: Normocephalic and atraumatic.  Mouth/Throat: Oropharynx is clear and moist. Mucous membranes are dry.  Dry mucous membranes  Eyes: Conjunctivae and EOM are normal. Pupils are equal, round, and reactive to light. Right eye exhibits no discharge. Left eye exhibits no discharge.  PERRL, EOMI, no nystagmus, no visual field deficits     Neck: Normal range of motion. Neck supple. No spinous process tenderness and no muscular tenderness present. No rigidity. Normal range of motion present.  FROM intact without spinous process TTP, no bony stepoffs or deformities, no paraspinous muscle TTP or muscle spasms. No rigidity or meningeal signs. No bruising or swelling.   Cardiovascular: Normal rate, regular rhythm, normal heart sounds and intact distal pulses.  Exam reveals no gallop and no friction rub.   No murmur heard. HR 90s but when she stands she goes into the 100s  Pulmonary/Chest: Effort normal and breath sounds normal. No respiratory distress. She has no decreased breath sounds. She has no wheezes. She has no rhonchi. She has no rales.  Abdominal: Soft. Normal appearance and bowel sounds are normal. She exhibits no distension. There is tenderness in the right upper quadrant, epigastric area and left upper quadrant. There is no rigidity, no rebound, no guarding, no CVA tenderness, no tenderness at McBurney's point and negative Murphy's sign.    Soft, nondistended, +BS throughout, with mild TTP in epigastrum/across upper abdomen, no r/g/r, neg murphy's, neg mcburney's, no CVA TTP   Musculoskeletal: Normal range of motion.  MAE x4 Strength and sensation grossly intact Distal pulses intact Gait steady  Neurological: She is alert and oriented to person, place, and time. She has normal strength. No cranial nerve deficit or sensory deficit. Coordination and gait normal. GCS eye subscore is 4.  GCS verbal subscore is 5. GCS motor subscore is 6.  CN 2-12 grossly intact A&O x4 GCS 15 Sensation and strength intact Gait nonataxic including with tandem walking Coordination with finger-to-nose WNL Neg pronator drift   Skin: Skin is warm, dry and intact. No rash noted.  Psychiatric: She has a normal mood and affect.  Nursing note and vitals reviewed.   ED Course  Procedures (including critical care time)  22:03:17 Orthostatic  Vital Signs BB  Orthostatic Lying  - BP- Lying: 120/65 mmHg ; Pulse- Lying: 92  Orthostatic Sitting - BP- Sitting: 124/72 mmHg ; Pulse- Sitting: 97  Orthostatic Standing at 0 minutes - BP- Standing at 0 minutes: 112/70 mmHg ; Pulse- Standing at 0 minutes: 99       Labs Review Labs Reviewed  BASIC METABOLIC PANEL - Abnormal; Notable for the following:    Glucose, Bld 107 (*)    BUN 24 (*)    All other components within normal limits  CBC - Abnormal; Notable for the following:    RBC 5.14 (*)    Hemoglobin 16.5 (*)    HCT 46.1 (*)    All other components within normal limits  HEPATIC FUNCTION PANEL - Abnormal; Notable for the following:    Total Bilirubin 1.5 (*)    Indirect Bilirubin 1.3 (*)    All other components within normal limits  GASTROINTESTINAL PANEL BY PCR, STOOL (REPLACES STOOL CULTURE)  C DIFFICILE QUICK SCREEN W PCR REFLEX  LIPASE, BLOOD  URINALYSIS, ROUTINE W REFLEX MICROSCOPIC (NOT AT [facility])  I-STAT TROPOININ, ED    Imaging Review Dg Abd Acute W/chest  05/04/2015  CLINICAL DATA:  Acute onset of nausea, vomiting and diarrhea. Mid abdominal pain. Initial encounter. EXAM: DG ABDOMEN ACUTE W/ 1V CHEST COMPARISON:  None. FINDINGS: The lungs are well-aerated. Chronically increased interstitial markings are suggested. There is no evidence of focal opacification, pleural effusion or pneumothorax. The cardiomediastinal silhouette is within normal limits. Bilateral breast implants are noted. The visualized bowel gas pattern is unremarkable. Scattered fluid and air are seen within the colon; there is no evidence of small bowel dilatation to suggest obstruction. No free intra-abdominal air is identified on the provided upright view. Clips are noted within the right upper quadrant, reflecting prior cholecystectomy. No acute osseous abnormalities are seen; the sacroiliac joints are unremarkable in appearance. Mild degenerative change is noted along the lumbar spine. IMPRESSION: 1.  Unremarkable bowel gas pattern; no free intra-abdominal air seen. 2. Chronically increased interstitial markings suggested. This may reflect relatively mild chronic interstitial lung disease. Would correlate with the patient's symptoms. Electronically Signed   By: Garald Balding M.D.   On: 05/04/2015 22:48     01/06/14: MR BRAIN WITHOUT AND WITH CONTRAST: HISTORY:  Breast cancer. TECHNIQUE:  15 mL MultiHance. COMPARISON:  CT of the brain 01/06/14. FINDINGS:  The ventricles are volumetrically normal for the patient's age. A confluent area of hyperintense signal on T2 weighted and FLAIR sequences  is noted in the right parietal subcortical white matter.  In addition  there are scattered small focal areas of increased signal intensity in the  cerebral hemisphere white matter on T2 weighted and FLAIR sequences.  There is no restriction of diffusion in the brain.  No enhancement of any  of these areas is appreciated.  There is a normal enhancement pattern in  the meninges.  The mastoids and paranasal sinuses are clear.  IMPRESSION: Mild cerebral hemisphere white matter disease consistent with underlying  chronic microvascular ischemia.  Focal area of signal abnormality in the  right parietal lobe consistent with chronic infarction.  No evidence of  intracranial metastatic disease.  [id number]  STEPHENSON/lb-STS Dictation D/T:  01/06/2014  4:35 p.m. Work Station:  [facility]  I have personally reviewed and evaluated these images and lab results as part of my medical decision-making.   EKG Interpretation   Date/Time:  Wednesday May 04 2015 17:46:25 EST Ventricular Rate:  100 PR Interval:  120 QRS Duration: 74 QT Interval:  336 QTC Calculation: 433 R Axis:   34 Text Interpretation:  Sinus rhythm with occasional Premature ventricular  complexes Nonspecific ST and T wave abnormality Confirmed by Jeneen Rinks  MD,  [place] ([id number]) on 05/04/2015 9:30:41 PM      MDM   Final diagnoses:  Nausea  vomiting and diarrhea  Gastroenteritis  Upper abdominal pain  Orthostatic lightheadedness  Dehydration    69 y.o. female here with n/v/d that began this morning, and then she developed slight SOB and lightheadedness with standing after that. No focal neuro deficits, pt has hx of CA but in remission with recent oncology note confirming this, had brain MRI in the past (01/2014) when she had similar symptoms and this ruled out brain mets. Pt with +orthostasis on exam (HR rises when she stands). Will get formal orthostatic VS but this is likely the cause of her lightheadedness/SOB. EKG with sinus rhythm and PVCs. CBC without leukocytosis but with hemoconcentration on H/H. BMP with mildly elevated BUN. Appears dehydrated. On exam, mildly tender epigastrum/upper abdomen without peritoneal signs. Will get Acute abd series since pt has had multiple prior surgeries, LFTs and lipase, trop, and attempt to collect stool sample for C.diff/stool PCR testing. Will give fluids and zofran and reassess shortly. Pt declines pain meds.  11:56 PM Lipase WNL. Trop neg. LFTs with mildly elevated bili at 1.5 but otherwise WNL. Acute abd series without evidence of obstruction/perf. Stool sample just collected, awaiting results of c.diff PCR. Still awaiting U/A. Pt's abd pain returning, will try bentyl since pt is no longer nauseated. No ongoing orthostasis after fluids. Will reassess shortly once U/A and C.diff return  12:36 AM Pt without significant improvement yet after bentyl given shortly ago, pt seen by Dr. Jeneen Rinks who agrees with plan to await U/A and c.diff and as long as it's negative then d/c home with bentyl, lomotil, and zofran. He also agrees with giving small amount of morphine now which may help with diarrhea as well, since she's no longer nauseated or lightheaded which would've been potential side effects of morphine. Awaiting Cdiff and U/A. Will reassess shortly   12:55 AM Called lab, stating that it will take  1hr for Cdiff to result. Care signed out to Quincy Carnes PA-C at shift change, who will f/up with U/A and C.diff. If negative, she can go home with bentyl, lomotil, and zofran and f/up with PCP in 3 days. If positive for C.diff then she may need admission. Please see Lisa's note for further documentation of care and dispo.   BP 120/65 mmHg  Pulse 84  Temp(Src) 97.4 F (36.3 C) (Oral)  Resp 16  SpO2 97%  Meds ordered this encounter  Medications  . sodium chloride 0.9 % bolus 1,000 mL    Sig:   . ondansetron (ZOFRAN) injection 4 mg    Sig:   . dicyclomine (BENTYL) capsule 20 mg    Sig:   . diphenoxylate-atropine (LOMOTIL) 2.5-0.025 MG tablet    Sig: Take 2 tablets by mouth 4 (four)  times daily as needed for diarrhea or loose stools. Take 2 TABS PO 4 times daily until initial control of diarrhea achieved, then reduce dosage to amount necessary to maintain bowel control with maximum of 8 tablets/day    Dispense:  16 tablet    Refill:  0    Order Specific Question:  Supervising Provider    Answer:  Noemi Chapel [[id number]]  . dicyclomine (BENTYL) 20 MG tablet    Sig: Take 1 tablet (20 mg total) by mouth 3 (three) times daily as needed for spasms.    Dispense:  15 tablet    Refill:  0    Order Specific Question:  Supervising Provider    Answer:  MILLER, BRIAN [[id number]]  . ondansetron (ZOFRAN ODT) 4 MG disintegrating tablet    Sig: Take 1 tablet (4 mg total) by mouth every 8 (eight) hours as needed for nausea or vomiting.    Dispense:  15 tablet    Refill:  0    Order Specific Question:  Supervising Provider    Answer:  MILLER, BRIAN [[id number]]  . morphine 2 MG/ML injection 2 mg    Sig:      Reet Scharrer Camprubi-Soms, PA-C 05/05/15 0057  Tanna Furry, MD 05/14/15 1016

## 2015-05-04 NOTE — ED Notes (Addendum)
Pt reports N/V/D and generalized weakness and dizziness that started this morning.

## 2015-05-04 NOTE — ED Notes (Signed)
PA at bedside.

## 2015-05-05 ENCOUNTER — Telehealth (HOSPITAL_COMMUNITY): Payer: Self-pay

## 2015-05-05 DIAGNOSIS — K529 Noninfective gastroenteritis and colitis, unspecified: Secondary | ICD-10-CM | POA: Diagnosis not present

## 2015-05-05 LAB — URINE MICROSCOPIC-ADD ON

## 2015-05-05 LAB — URINALYSIS, ROUTINE W REFLEX MICROSCOPIC
BILIRUBIN URINE: NEGATIVE
GLUCOSE, UA: NEGATIVE mg/dL
Ketones, ur: 15 mg/dL — AB
Nitrite: NEGATIVE
PH: 6 (ref 5.0–8.0)
Protein, ur: NEGATIVE mg/dL
SPECIFIC GRAVITY, URINE: 1.023 (ref 1.005–1.030)

## 2015-05-05 LAB — C DIFFICILE QUICK SCREEN W PCR REFLEX
C Diff antigen: NEGATIVE
C Diff interpretation: NEGATIVE
C Diff toxin: NEGATIVE

## 2015-05-05 MED ORDER — ONDANSETRON HCL 4 MG/2ML IJ SOLN
4.0000 mg | Freq: Once | INTRAMUSCULAR | Status: AC
  Administered 2015-05-05: 4 mg via INTRAVENOUS
  Filled 2015-05-05: qty 2

## 2015-05-05 MED ORDER — ONDANSETRON 4 MG PO TBDP: 4.0000 mg | ORAL_TABLET | Freq: Three times a day (TID) | ORAL | Status: DC | PRN

## 2015-05-05 MED ORDER — CEPHALEXIN 500 MG PO CAPS: 500.0000 mg | ORAL_CAPSULE | Freq: Four times a day (QID) | ORAL | Status: DC

## 2015-05-05 MED ORDER — DICYCLOMINE HCL 20 MG PO TABS: 20.0000 mg | ORAL_TABLET | Freq: Three times a day (TID) | ORAL | Status: DC | PRN

## 2015-05-05 MED ORDER — MORPHINE SULFATE (PF) 2 MG/ML IV SOLN
2.0000 mg | Freq: Once | INTRAVENOUS | Status: AC
  Administered 2015-05-05: 2 mg via INTRAVENOUS
  Filled 2015-05-05: qty 1

## 2015-05-05 MED ORDER — DIPHENOXYLATE-ATROPINE 2.5-0.025 MG PO TABS: 2.0000 | ORAL_TABLET | Freq: Four times a day (QID) | ORAL | Status: DC | PRN

## 2015-05-05 NOTE — ED Provider Notes (Signed)
Patient received in sign out at shift change.  Please see previous providers note for full H&P.  69 y.o. F here with N/V/D x 1 day.  Lab work thus far has been reassuring as well as abdominal films.  Plan:  C. Diff PCR pending as well as U/A.  Will follow results and dispo.  Results for orders placed or performed during the hospital encounter of 05/04/15  C difficile quick scan w PCR reflex  Result Value Ref Range   C Diff antigen NEGATIVE NEGATIVE   C Diff toxin NEGATIVE NEGATIVE   C Diff interpretation Negative for toxigenic C. difficile   Basic metabolic panel  Result Value Ref Range   Sodium 140 135 - 145 mmol/L   Potassium 3.6 3.5 - 5.1 mmol/L   Chloride 104 101 - 111 mmol/L   CO2 22 22 - 32 mmol/L   Glucose, Bld 107 (H) 65 - 99 mg/dL   BUN 24 (H) 6 - 20 mg/dL   Creatinine, Ser 0.81 0.44 - 1.00 mg/dL   Calcium 9.8 8.9 - 10.3 mg/dL   GFR calc non Af Amer >60 >60 mL/min   GFR calc Af Amer >60 >60 mL/min   Anion gap 14 5 - 15  CBC  Result Value Ref Range   WBC 7.8 4.0 - 10.5 K/uL   RBC 5.14 (H) 3.87 - 5.11 MIL/uL   Hemoglobin 16.5 (H) 12.0 - 15.0 g/dL   HCT 46.1 (H) 36.0 - 46.0 %   MCV 89.7 78.0 - 100.0 fL   MCH 32.1 26.0 - 34.0 pg   MCHC 35.8 30.0 - 36.0 g/dL   RDW 13.1 11.5 - 15.5 %   Platelets 190 150 - 400 K/uL  Urinalysis, Routine w reflex microscopic (not at [facility])  Result Value Ref Range   Color, Urine YELLOW YELLOW   APPearance CLOUDY (A) CLEAR   Specific Gravity, Urine 1.023 1.005 - 1.030   pH 6.0 5.0 - 8.0   Glucose, UA NEGATIVE NEGATIVE mg/dL   Hgb urine dipstick TRACE (A) NEGATIVE   Bilirubin Urine NEGATIVE NEGATIVE   Ketones, ur 15 (A) NEGATIVE mg/dL   Protein, ur NEGATIVE NEGATIVE mg/dL   Nitrite NEGATIVE NEGATIVE   Leukocytes, UA LARGE (A) NEGATIVE  Lipase, blood  Result Value Ref Range   Lipase 32 11 - 51 U/L  Hepatic function panel  Result Value Ref Range   Total Protein 6.8 6.5 - 8.1 g/dL   Albumin 4.4 3.5 - 5.0 g/dL   AST 30 15 - 41 U/L   ALT  27 14 - 54 U/L   Alkaline Phosphatase 114 38 - 126 U/L   Total Bilirubin 1.5 (H) 0.3 - 1.2 mg/dL   Bilirubin, Direct 0.2 0.1 - 0.5 mg/dL   Indirect Bilirubin 1.3 (H) 0.3 - 0.9 mg/dL  Urine microscopic-add on  Result Value Ref Range   Squamous Epithelial / LPF 0-5 (A) NONE SEEN   WBC, UA TOO NUMEROUS TO COUNT 0 - 5 WBC/hpf   RBC / HPF 6-30 0 - 5 RBC/hpf   Bacteria, UA MANY (A) NONE SEEN   Urine-Other MUCOUS PRESENT   I-stat troponin, ED  Result Value Ref Range   Troponin i, poc 0.01 0.00 - 0.08 ng/mL   Comment 3           Dg Abd Acute W/chest  05/04/2015  CLINICAL DATA:  Acute onset of nausea, vomiting and diarrhea. Mid abdominal pain. Initial encounter. EXAM: DG ABDOMEN ACUTE W/ 1V CHEST COMPARISON:  None. FINDINGS: The lungs are well-aerated. Chronically increased interstitial markings are suggested. There is no evidence of focal opacification, pleural effusion or pneumothorax. The cardiomediastinal silhouette is within normal limits. Bilateral breast implants are noted. The visualized bowel gas pattern is unremarkable. Scattered fluid and air are seen within the colon; there is no evidence of small bowel dilatation to suggest obstruction. No free intra-abdominal air is identified on the provided upright view. Clips are noted within the right upper quadrant, reflecting prior cholecystectomy. No acute osseous abnormalities are seen; the sacroiliac joints are unremarkable in appearance. Mild degenerative change is noted along the lumbar spine. IMPRESSION: 1. Unremarkable bowel gas pattern; no free intra-abdominal air seen. 2. Chronically increased interstitial markings suggested. This may reflect relatively mild chronic interstitial lung disease. Would correlate with the patient's symptoms. Electronically Signed   By: Garald Balding M.D.   On: 05/04/2015 22:48   C. Diff PCR negative.  U/a appears infectious with many bacteria and large leukocytes.  Patient does admit to some urinary frequency.   Will start on short course keflex pending urine culture.  Patient also given scripts for zofran, bentyl, and lomotil per prior provider.  FU with PCP.  Discussed plan with patient, he/she acknowledged understanding and agreed with plan of care.  Return precautions given for new or worsening symptoms.  Larene Pickett, PA-C 05/05/15 [id number]  Veryl Speak, MD 05/05/15 [phone]

## 2015-05-05 NOTE — Discharge Instructions (Signed)
Use zofran as prescribed, as needed for nausea. Stay well hydrated with small sips of fluids throughout the day. Follow a BRAT (banana-rice-applesauce-toast) diet as described below for the next 24-48 hours. The 'BRAT' diet is suggested, then progress to diet as tolerated as symptoms abate. Use Lomotil as directed as needed for diarrhea, and use Bentyl as directed as needed for abdominal cramping/spasms. Take tylenol or motrin as needed for abdominal pain. Call your regular doctor if bloody stools, persistent diarrhea, vomiting, fever, or worsening abdominal pain.  Return to ER for changing or worsening of symptoms.  Food Choices to Help Relieve Diarrhea When you have diarrhea, the foods you eat and your eating habits are very important. Choosing the right foods and drinks can help relieve diarrhea. Also, because diarrhea can last up to 7 days, you need to replace lost fluids and electrolytes (such as sodium, potassium, and chloride) in order to help prevent dehydration.  WHAT GENERAL GUIDELINES DO I NEED TO FOLLOW?  Slowly drink 1 cup (8 oz) of fluid for each episode of diarrhea. If you are getting enough fluid, your urine will be clear or pale yellow.  Eat starchy foods. Some good choices include white rice, white toast, pasta, low-fiber cereal, baked potatoes (without the skin), saltine crackers, and bagels.  Avoid large servings of any cooked vegetables.  Limit fruit to two servings per day. A serving is  cup or 1 small piece.  Choose foods with less than 2 g of fiber per serving.  Limit fats to less than 8 tsp (38 g) per day.  Avoid fried foods.  Eat foods that have probiotics in them. Probiotics can be found in certain dairy products.  Avoid foods and beverages that may increase the speed at which food moves through the stomach and intestines (gastrointestinal tract). Things to avoid include:  High-fiber foods, such as dried fruit, raw fruits and vegetables, nuts, seeds, and whole  grain foods.  Spicy foods and high-fat foods.  Foods and beverages sweetened with high-fructose corn syrup, honey, or sugar alcohols such as xylitol, sorbitol, and mannitol. WHAT FOODS ARE RECOMMENDED? Grains White rice. White, [place], or pita breads (fresh or toasted), including plain rolls, buns, or bagels. White pasta. Saltine, soda, or graham crackers. Pretzels. Low-fiber cereal. Cooked cereals made with water (such as cornmeal, farina, or cream cereals). Plain muffins. Matzo. Melba toast. Zwieback.  Vegetables Potatoes (without the skin). Strained tomato and vegetable juices. Most well-cooked and canned vegetables without seeds. Tender lettuce. Fruits Cooked or canned applesauce, apricots, cherries, fruit cocktail, grapefruit, peaches, pears, or plums. Fresh bananas, apples without skin, cherries, grapes, cantaloupe, grapefruit, peaches, oranges, or plums.  Meat and Other Protein Products Baked or boiled chicken. Eggs. Tofu. Fish. Seafood. Smooth peanut butter. Ground or well-cooked tender beef, ham, veal, lamb, pork, or poultry.  Dairy Plain yogurt, kefir, and unsweetened liquid yogurt. Lactose-free milk, buttermilk, or soy milk. Plain hard cheese. Beverages Sport drinks. Clear broths. Diluted fruit juices (except prune). Regular, caffeine-free sodas such as ginger ale. Water. Decaffeinated teas. Oral rehydration solutions. Sugar-free beverages not sweetened with sugar alcohols. Other Bouillon, broth, or soups made from recommended foods.  The items listed above may not be a complete list of recommended foods or beverages. Contact your dietitian for more options. WHAT FOODS ARE NOT RECOMMENDED? Grains Whole grain, whole wheat, bran, or rye breads, rolls, pastas, crackers, and cereals. Wild or brown rice. Cereals that contain more than 2 g of fiber per serving. Corn tortillas or taco shells. Cooked  or dry oatmeal. Granola. Popcorn. Vegetables Raw vegetables. Cabbage, broccoli, Brussels  sprouts, artichokes, baked beans, beet greens, corn, kale, legumes, peas, sweet potatoes, and yams. Potato skins. Cooked spinach and cabbage. Fruits Dried fruit, including raisins and dates. Raw fruits. Stewed or dried prunes. Fresh apples with skin, apricots, mangoes, pears, raspberries, and strawberries.  Meat and Other Protein Products Chunky peanut butter. Nuts and seeds. Beans and lentils. Berniece Salines.  Dairy High-fat cheeses. Milk, chocolate milk, and beverages made with milk, such as milk shakes. Cream. Ice cream. Sweets and Desserts Sweet rolls, doughnuts, and sweet breads. Pancakes and waffles. Fats and Oils Butter. Cream sauces. Margarine. Salad oils. Plain salad dressings. Olives. Avocados.  Beverages Caffeinated beverages (such as coffee, tea, soda, or energy drinks). Alcoholic beverages. Fruit juices with pulp. Prune juice. Soft drinks sweetened with high-fructose corn syrup or sugar alcohols. Other Coconut. Hot sauce. Chili powder. Mayonnaise. Gravy. Cream-based or milk-based soups.  The items listed above may not be a complete list of foods and beverages to avoid. Contact your dietitian for more information. WHAT SHOULD I DO IF I BECOME DEHYDRATED? Diarrhea can sometimes lead to dehydration. Signs of dehydration include dark urine and dry mouth and skin. If you think you are dehydrated, you should rehydrate with an oral rehydration solution. These solutions can be purchased at pharmacies, retail stores, or online.  Drink -1 cup (120-240 mL) of oral rehydration solution each time you have an episode of diarrhea. If drinking this amount makes your diarrhea worse, try drinking smaller amounts more often. For example, drink 1-3 tsp (5-15 mL) every 5-10 minutes.  A general rule for staying hydrated is to drink 1-2 L of fluid per day. Talk to your health care provider about the specific amount you should be drinking each day. Drink enough fluids to keep your urine clear or pale  yellow. Document Released: 07/14/2003 Document Revised: 04/28/2013 Document Reviewed: 03/16/2013 [facility] Patient Information 2015 [place], [place]. This information is not intended to replace advice given to you by your health care provider. Make sure you discuss any questions you have with your health care provider.   Diarrhea Diarrhea is watery poop (stool). It can make you feel weak, tired, thirsty, or give you a dry mouth (signs of dehydration). Watery poop is a sign of another problem, most often an infection. It often lasts 2-3 days. It can last longer if it is a sign of something serious. Take care of yourself as told by your doctor. HOME CARE   Drink 1 cup (8 ounces) of fluid each time you have watery poop.  Do not drink the following fluids:  Those that contain simple sugars (fructose, glucose, galactose, lactose, sucrose, maltose).  Sports drinks.  Fruit juices.  Whole milk products.  Sodas.  Drinks with caffeine (coffee, tea, soda) or alcohol.  Oral rehydration solution may be used if the doctor says it is okay. You may make your own solution. Follow this recipe:   - teaspoon table salt.   teaspoon baking soda.   teaspoon salt substitute containing potassium chloride.  1 tablespoons sugar.  1 liter (34 ounces) of water.  Avoid the following foods:  High fiber foods, such as raw fruits and vegetables.  Nuts, seeds, and whole grain breads and cereals.   Those that are sweetened with sugar alcohols (xylitol, sorbitol, mannitol).  Try eating the following foods:  Starchy foods, such as rice, toast, pasta, low-sugar cereal, oatmeal, baked potatoes, crackers, and bagels.  Bananas.  Applesauce.  Eat probiotic-rich foods,  such as yogurt and milk products that are fermented.  Wash your hands well after each time you have watery poop.  Only take medicine as told by your doctor.  Take a warm bath to help lessen burning or pain from having watery poop. GET  HELP RIGHT AWAY IF:   You cannot drink fluids without throwing up (vomiting).  You keep throwing up.  You have blood in your poop, or your poop looks black and tarry.  You do not pee (urinate) in 6-8 hours, or there is only a small amount of very dark pee.  You have belly (abdominal) pain that gets worse or stays in the same spot (localizes).  You are weak, dizzy, confused, or light-headed.  You have a very bad headache.  Your watery poop gets worse or does not get better.  You have a fever or lasting symptoms for more than 2-3 days.  You have a fever and your symptoms suddenly get worse. MAKE SURE YOU:   Understand these instructions.  Will watch your condition.  Will get help right away if you are not doing well or get worse.   This information is not intended to replace advice given to you by your health care provider. Make sure you discuss any questions you have with your health care provider.   Document Released: 10/10/2007 Document Revised: 05/14/2014 Document Reviewed: 12/30/2011 Elsevier Interactive Patient Education 2016 Elsevier Inc.  Dehydration, Adult Dehydration is a condition in which you do not have enough fluid or water in your body. It happens when you take in less fluid than you lose. Vital organs such as the kidneys, brain, and heart cannot function without a proper amount of fluids. Any loss of fluids from the body can cause dehydration.  Dehydration can range from mild to severe. This condition should be treated right away to help prevent it from becoming severe. CAUSES  This condition may be caused by:  Vomiting.  Diarrhea.  Excessive sweating, such as when exercising in hot or humid weather.  Not drinking enough fluid during strenuous exercise or during an illness.  Excessive urine output.  Fever.  Certain medicines. RISK FACTORS This condition is more likely to develop in:  People who are taking certain medicines that cause the body to  lose excess fluid (diuretics).   People who have a chronic illness, such as diabetes, that may increase urination.  Older adults.   People who live at high altitudes.   People who participate in endurance sports.  SYMPTOMS  Mild Dehydration  Thirst.  Dry lips.  Slightly dry mouth.  Dry, warm skin. Moderate Dehydration  Very dry mouth.   Muscle cramps.   Dark urine and decreased urine production.   Decreased tear production.   Headache.   Light-headedness, especially when you stand up from a sitting position.  Severe Dehydration  Changes in skin.   Cold and clammy skin.   Skin does not spring back quickly when lightly pinched and released.   Changes in body fluids.   Extreme thirst.   No tears.   Not able to sweat when body temperature is high, such as in hot weather.   Minimal urine production.   Changes in vital signs.   Rapid, weak pulse (more than 100 beats per minute when you are sitting still).   Rapid breathing.   Low blood pressure.   Other changes.   Sunken eyes.   Cold hands and feet.   Confusion.  Lethargy and difficulty being awakened.  Fainting (syncope).   Short-term weight loss.   Unconsciousness. DIAGNOSIS  This condition may be diagnosed based on your symptoms. You may also have tests to determine how severe your dehydration is. These tests may include:   Urine tests.   Blood tests.  TREATMENT  Treatment for this condition depends on the severity. Mild or moderate dehydration can often be treated at home. Treatment should be started right away. Do not wait until dehydration becomes severe. Severe dehydration needs to be treated at the hospital. Treatment for Mild Dehydration  Drinking plenty of water to replace the fluid you have lost.   Replacing minerals in your blood (electrolytes) that you may have lost.  Treatment for Moderate Dehydration  Consuming oral rehydration solution  (ORS). Treatment for Severe Dehydration  Receiving fluid through an IV tube.   Receiving electrolyte solution through a feeding tube that is passed through your nose and into your stomach (nasogastric tube or NG tube).  Correcting any abnormalities in electrolytes. HOME CARE INSTRUCTIONS   Drink enough fluid to keep your urine clear or pale yellow.   Drink water or fluid slowly by taking small sips. You can also try sucking on ice cubes.  Have food or beverages that contain electrolytes. Examples include bananas and sports drinks.  Take over-the-counter and prescription medicines only as told by your health care provider.   Prepare ORS according to the manufacturer's instructions. Take sips of ORS every 5 minutes until your urine returns to normal.  If you have vomiting or diarrhea, continue to try to drink water, ORS, or both.   If you have diarrhea, avoid:   Beverages that contain caffeine.   Fruit juice.   Milk.   Carbonated soft drinks.  Do not take salt tablets. This can lead to the condition of having too much sodium in your body (hypernatremia).  SEEK MEDICAL CARE IF:  You cannot eat or drink without vomiting.  You have had moderate diarrhea during a period of more than 24 hours.  You have a fever. SEEK IMMEDIATE MEDICAL CARE IF:   You have extreme thirst.  You have severe diarrhea.  You have not urinated in 6-8 hours, or you have urinated only a small amount of very dark urine.  You have shriveled skin.  You are dizzy, confused, or both.   This information is not intended to replace advice given to you by your health care provider. Make sure you discuss any questions you have with your health care provider.   Document Released: 04/23/2005 Document Revised: 01/12/2015 Document Reviewed: 09/08/2014 Elsevier Interactive Patient Education 2016 Elsevier Inc.  Nausea and Vomiting Nausea means you feel sick to your stomach. Throwing up (vomiting)  is a reflex where stomach contents come out of your mouth. HOME CARE   Take medicine as told by your doctor.  Do not force yourself to eat. However, you do need to drink fluids.  If you feel like eating, eat a normal diet as told by your doctor.  Eat rice, wheat, potatoes, bread, lean meats, yogurt, fruits, and vegetables.  Avoid high-fat foods.  Drink enough fluids to keep your pee (urine) clear or pale yellow.  Ask your doctor how to replace body fluid losses (rehydrate). Signs of body fluid loss (dehydration) include:  Feeling very thirsty.  Dry lips and mouth.  Feeling dizzy.  Dark pee.  Peeing less than normal.  Feeling confused.  Fast breathing or heart rate. GET HELP RIGHT AWAY IF:   You have blood in  your throw up.  You have black or bloody poop (stool).  You have a bad headache or stiff neck.  You feel confused.  You have bad belly (abdominal) pain.  You have chest pain or trouble breathing.  You do not pee at least once every 8 hours.  You have cold, clammy skin.  You keep throwing up after 24 to 48 hours.  You have a fever. MAKE SURE YOU:   Understand these instructions.  Will watch your condition.  Will get help right away if you are not doing well or get worse.   This information is not intended to replace advice given to you by your health care provider. Make sure you discuss any questions you have with your health care provider.   Document Released: 10/10/2007 Document Revised: 07/16/2011 Document Reviewed: 09/22/2010 Elsevier Interactive Patient Education 2016 Elsevier Inc.    Abdominal Pain, Adult Many things can cause belly (abdominal) pain. Most times, the belly pain is not dangerous. Many cases of belly pain can be watched and treated at home. HOME CARE   Do not take medicines that help you go poop (laxatives) unless told to by your doctor.  Only take medicine as told by your doctor.  Eat or drink as told by your doctor.  Your doctor will tell you if you should be on a special diet. GET HELP IF:  You do not know what is causing your belly pain.  You have belly pain while you are sick to your stomach (nauseous) or have runny poop (diarrhea).  You have pain while you pee or poop.  Your belly pain wakes you up at night.  You have belly pain that gets worse or better when you eat.  You have belly pain that gets worse when you eat fatty foods.  You have a fever. GET HELP RIGHT AWAY IF:   The pain does not go away within 2 hours.  You keep throwing up (vomiting).  The pain changes and is only in the right or left part of the belly.  You have bloody or tarry looking poop. MAKE SURE YOU:   Understand these instructions.  Will watch your condition.  Will get help right away if you are not doing well or get worse.   This information is not intended to replace advice given to you by your health care provider. Make sure you discuss any questions you have with your health care provider.   Document Released: 10/10/2007 Document Revised: 05/14/2014 Document Reviewed: 12/31/2012 Elsevier Interactive Patient Education [employer].

## 2015-05-06 LAB — GASTROINTESTINAL PANEL BY PCR, STOOL (REPLACES STOOL CULTURE)
Adenovirus F40/41: NOT DETECTED
Astrovirus: NOT DETECTED
Campylobacter species: NOT DETECTED
Cryptosporidium: NOT DETECTED
Cyclospora cayetanensis: NOT DETECTED
E. coli O157: NOT DETECTED
ENTAMOEBA HISTOLYTICA: NOT DETECTED
ENTEROAGGREGATIVE E COLI (EAEC): NOT DETECTED
Enteropathogenic E coli (EPEC): NOT DETECTED
Enterotoxigenic E coli (ETEC): NOT DETECTED
GIARDIA LAMBLIA: NOT DETECTED
NOROVIRUS GI/GII: DETECTED — AB
Plesimonas shigelloides: NOT DETECTED
Rotavirus A: NOT DETECTED
SALMONELLA SPECIES: NOT DETECTED
SHIGELLA/ENTEROINVASIVE E COLI (EIEC): NOT DETECTED
Sapovirus (I, II, IV, and V): NOT DETECTED
Shiga like toxin producing E coli (STEC): NOT DETECTED
VIBRIO CHOLERAE: NOT DETECTED
Vibrio species: NOT DETECTED
YERSINIA ENTEROCOLITICA: NOT DETECTED

## 2015-05-07 LAB — URINE CULTURE: Culture: >=100000

## 2015-05-08 ENCOUNTER — Telehealth (HOSPITAL_COMMUNITY): Payer: Self-pay

## 2015-05-08 NOTE — Telephone Encounter (Signed)
Post ED Visit - Positive Culture Follow-up: Chart Hand-off to ED Flow Manager  Culture assessed and recommendations reviewed by: []  Levester Fresh, Pharm.D., BCPS []  Heide Guile, Pharm.D., BCPS-AQ ID []  Alycia Rossetti, Pharm.D., BCPS []  Merrill, [place].D., BCPS, AAHIVP []  Legrand Como, Pharm .D., BCPS, AAHIVP [x]  Milus Glazier, Pharm.D. []  Dimitri Ped, Pharm.D.  Positive urine culture  []  Patient discharged without antimicrobial prescription and treatment is now indicated [x]  Organism is resistant to prescribed ED discharge antimicrobial []  Patient with positive blood cultures  Changes discussed with ED provider: Ward PA New antibiotic prescription D/C cephalexin and give macrobid 100mg  po bid x 5 days  Attempting to contact pt. Letter sent to address on file.    Ileene Musa 05/08/2015, 2:43 PM

## 2015-05-08 NOTE — Progress Notes (Signed)
ED Antimicrobial Stewardship Positive Culture Follow Up   Robin Valencia is an 70 y.o. female who presented to [facility] on 05/04/2015 with a chief complaint of  Chief Complaint  Patient presents with  . Emesis  . Dizziness    Recent Results (from the past 720 hour(s))  Gastrointestinal Panel by PCR , Stool     Status: Abnormal   Collection Time: 05/04/15 11:53 PM  Result Value Ref Range Status   Campylobacter species NOT DETECTED NOT DETECTED Final   Plesimonas shigelloides NOT DETECTED NOT DETECTED Final   Salmonella species NOT DETECTED NOT DETECTED Final   Yersinia enterocolitica NOT DETECTED NOT DETECTED Final   Vibrio species NOT DETECTED NOT DETECTED Final   Vibrio cholerae NOT DETECTED NOT DETECTED Final   Enteroaggregative E coli (EAEC) NOT DETECTED NOT DETECTED Final   Enteropathogenic E coli (EPEC) NOT DETECTED NOT DETECTED Final   Enterotoxigenic E coli (ETEC) NOT DETECTED NOT DETECTED Final   Shiga like toxin producing E coli (STEC) NOT DETECTED NOT DETECTED Final   E. coli O157 NOT DETECTED NOT DETECTED Final   Shigella/Enteroinvasive E coli (EIEC) NOT DETECTED NOT DETECTED Final   Cryptosporidium NOT DETECTED NOT DETECTED Final   Cyclospora cayetanensis NOT DETECTED NOT DETECTED Final   Entamoeba histolytica NOT DETECTED NOT DETECTED Final   Giardia lamblia NOT DETECTED NOT DETECTED Final   Adenovirus F40/41 NOT DETECTED NOT DETECTED Final   Astrovirus NOT DETECTED NOT DETECTED Final   Norovirus GI/GII DETECTED (A) NOT DETECTED Final    Comment: CRITICAL RESULT CALLED TO, READ BACK BY AND VERIFIED WITH: Called to [employer] 05/05/15 @ [id number] MLM    Rotavirus A NOT DETECTED NOT DETECTED Final   Sapovirus (I, II, IV, and V) NOT DETECTED NOT DETECTED Final  C difficile quick scan w PCR reflex     Status: None   Collection Time: 05/04/15 11:53 PM  Result Value Ref Range Status   C Diff antigen NEGATIVE NEGATIVE Final   C Diff toxin NEGATIVE NEGATIVE Final   C  Diff interpretation Negative for toxigenic C. difficile  Final  Urine culture     Status: None   Collection Time: 05/05/15  1:54 AM  Result Value Ref Range Status   Specimen Description URINE, RANDOM  Final   Special Requests NONE  Final   Culture >=100,000 COLONIES/mL ESCHERICHIA COLI  Final   Report Status 05/07/2015 FINAL  Final   Organism ID, Bacteria ESCHERICHIA COLI  Final      Susceptibility   Escherichia coli - MIC*    AMPICILLIN >=32 RESISTANT Resistant     CEFAZOLIN >=64 RESISTANT Resistant     CEFTRIAXONE 16 INTERMEDIATE Intermediate     CIPROFLOXACIN <=0.25 SENSITIVE Sensitive     GENTAMICIN <=1 SENSITIVE Sensitive     IMIPENEM <=0.25 SENSITIVE Sensitive     NITROFURANTOIN <=16 SENSITIVE Sensitive     TRIMETH/SULFA <=20 SENSITIVE Sensitive     AMPICILLIN/SULBACTAM 16 INTERMEDIATE Intermediate     PIP/TAZO <=4 SENSITIVE Sensitive     * >=100,000 COLONIES/mL ESCHERICHIA COLI    [x]  Treated with Cephalexin, organism resistant to prescribed antimicrobial  New antibiotic prescription: Macrobid 100 mg PO BID x 5 days  ED Provider: Amie Portland, PA-C  Cassie L. Nicole Kindred, PharmD PGY2 Infectious Diseases Pharmacy Resident Pager: [phone] 05/08/2015 11:17 AM

## 2015-05-10 ENCOUNTER — Telehealth (HOSPITAL_BASED_OUTPATIENT_CLINIC_OR_DEPARTMENT_OTHER): Payer: Self-pay | Admitting: Emergency Medicine

## 2015-05-11 ENCOUNTER — Encounter: Payer: Medicare Other | Admitting: Genetic Counselor

## 2015-05-11 ENCOUNTER — Other Ambulatory Visit: Payer: Medicare Other

## 2015-05-27 ENCOUNTER — Telehealth: Payer: Self-pay | Admitting: *Deleted

## 2015-05-27 NOTE — Telephone Encounter (Signed)
Call received in [place] from pt stating she would like to be seen my MD prior to scheduled appointment in April.  Per inquiry Joyful states she is having ongoing memory loss since completing chemotherapy.  " I would like to see Dr Jana Hakim so he could help me or refer me to some one about this.  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 Informed her MD per review of phone note may be able to refer her without needing to change the appointment.  Return call number given as [phone].  This message will be forwarded to MD and RN at desk for review, advice and pt contact per concern.

## 2015-06-04 ENCOUNTER — Telehealth (HOSPITAL_COMMUNITY): Payer: Self-pay

## 2015-06-04 NOTE — Telephone Encounter (Signed)
Unable to contact pt by mail or telephone. Unable to communicate lab results or treatment changes. 

## 2015-07-20 ENCOUNTER — Telehealth: Payer: Self-pay | Admitting: *Deleted

## 2015-07-20 NOTE — Telephone Encounter (Signed)
TC from patient to verify her upcoming appts. Reviewed appts with pt and she voiced understanding.  Pt also asked for another psychiatrist referral as the last one she received from Dr. Jana Hakim is not taking new pts.

## 2015-07-29 ENCOUNTER — Other Ambulatory Visit: Payer: Self-pay

## 2015-07-29 DIAGNOSIS — C50411 Malignant neoplasm of upper-outer quadrant of right female breast: Secondary | ICD-10-CM

## 2015-08-01 ENCOUNTER — Other Ambulatory Visit (HOSPITAL_BASED_OUTPATIENT_CLINIC_OR_DEPARTMENT_OTHER): Payer: Medicare Other

## 2015-08-01 DIAGNOSIS — C50411 Malignant neoplasm of upper-outer quadrant of right female breast: Secondary | ICD-10-CM | POA: Diagnosis present

## 2015-08-01 LAB — CBC WITH DIFFERENTIAL/PLATELET
BASO%: 0.7 % (ref 0.0–2.0)
Basophils Absolute: 0 10*3/uL (ref 0.0–0.1)
EOS%: 2.7 % (ref 0.0–7.0)
Eosinophils Absolute: 0.1 10*3/uL (ref 0.0–0.5)
HEMATOCRIT: 39.8 % (ref 34.8–46.6)
HEMOGLOBIN: 13.4 g/dL (ref 11.6–15.9)
LYMPH#: 1.1 10*3/uL (ref 0.9–3.3)
LYMPH%: 21.5 % (ref 14.0–49.7)
MCH: 31.4 pg (ref 25.1–34.0)
MCHC: 33.7 g/dL (ref 31.5–36.0)
MCV: 93 fL (ref 79.5–101.0)
MONO#: 0.6 10*3/uL (ref 0.1–0.9)
MONO%: 11.8 % (ref 0.0–14.0)
NEUT%: 63.3 % (ref 38.4–76.8)
NEUTROS ABS: 3.2 10*3/uL (ref 1.5–6.5)
PLATELETS: 185 10*3/uL (ref 145–400)
RBC: 4.28 10*6/uL (ref 3.70–5.45)
RDW: 13.7 % (ref 11.2–14.5)
WBC: 5.1 10*3/uL (ref 3.9–10.3)

## 2015-08-01 LAB — COMPREHENSIVE METABOLIC PANEL
ALBUMIN: 3.6 g/dL (ref 3.5–5.0)
ALT: 23 U/L (ref 0–55)
ANION GAP: 8 meq/L (ref 3–11)
AST: 25 U/L (ref 5–34)
Alkaline Phosphatase: 96 U/L (ref 40–150)
BILIRUBIN TOTAL: 0.67 mg/dL (ref 0.20–1.20)
BUN: 21.6 mg/dL (ref 7.0–26.0)
CALCIUM: 8.6 mg/dL (ref 8.4–10.4)
CO2: 25 meq/L (ref 22–29)
CREATININE: 0.8 mg/dL (ref 0.6–1.1)
Chloride: 111 mEq/L — ABNORMAL HIGH (ref 98–109)
EGFR: 78 mL/min/{1.73_m2} — ABNORMAL LOW (ref >90)
Glucose: 95 mg/dl (ref 70–140)
Potassium: 3.9 mEq/L (ref 3.5–5.1)
Sodium: 144 mEq/L (ref 136–145)
TOTAL PROTEIN: 6.3 g/dL — AB (ref 6.4–8.3)

## 2015-08-08 ENCOUNTER — Ambulatory Visit (HOSPITAL_BASED_OUTPATIENT_CLINIC_OR_DEPARTMENT_OTHER): Payer: Medicare Other | Admitting: Oncology

## 2015-08-08 ENCOUNTER — Telehealth: Payer: Self-pay | Admitting: Oncology

## 2015-08-08 VITALS — BP 121/66 | HR 89 | Temp 98.0°F | Resp 18 | Ht 64.0 in | Wt 142.9 lb

## 2015-08-08 DIAGNOSIS — F419 Anxiety disorder, unspecified: Secondary | ICD-10-CM

## 2015-08-08 DIAGNOSIS — C50411 Malignant neoplasm of upper-outer quadrant of right female breast: Secondary | ICD-10-CM | POA: Diagnosis not present

## 2015-08-08 NOTE — Progress Notes (Signed)
[place]  [phone] [phone]     ID: [name] DOB: 1945/09/05  MR#: [id number]  [id number]  Patient Care Team: Chauncey Cruel, MD as Consulting Physician (Oncology) PCP: No primary care provider on file. GYN: SU:  OTHER MD: Loyal Jacobson MD  CHIEF COMPLAINT: Triple negative breast cancer  CURRENT TREATMENT: Observation   BREAST CANCER HISTORY: From the original intake note:  Yunique tells me she palpated a change in her right breast January 2015. It was "very high, almost outside the breast". She contacted her primary care physician, underwent mammography and ultrasonography, (I do not have those records) and on 06/03/2013 underwent biopsy of a mass described as "T2 NX) in the upper outer quadrant of the right breast. The pathology from that procedure [facility] [id number]) showed an invasive ductal carcinoma, grade 3, triple negative, with an MIB-1 of 91%.  The patient was treated neoadjuvantly with cyclophosphamide and doxorubicin between 07/23/2013 and 09/30/2013. There were no dose reductions, but there were delays between the second and third cycle (4 weeks (and the third and fourth (3 weeks). This was followed by paclitaxel weekly 12, with no treatment delays, but dose reduction of 20% with the final 4 doses. This was completed 01/14/2014.  On 02/19/2014 the patient underwent right mastectomy and sentinel lymph node sampling, with the final pathology (CNS [id number]) showing a complete pathologic response in the breast (no invasive disease and no DCIS) and all 4 sentinel lymph nodes clear. The patient then proceeded to adjuvant radiation. I do not have the radiation records.  Her subsequent history is as detailed below  INTERVAL HISTORY: [place] today for follow-up of her triple negative breast cancer. She brought me records from her [place] and these are separately scanned. However she has not yet established yourself with  any other physicians here in town. In particular we had suggested a couple of internists and she was not able to follow-up on that. She did call the psychiatrist I suggested but he is not taking any new patients.  REVIEW OF SYSTEMS: Valecia continues to have a variety of symptoms which are not particularly suggestive of recurrent or metastatic disease. She feels tired I, and it does affect her activities of daily living. She feels forgetful and has a hard time concentrating. She wonders if she will be able to go back to work the way she used to work before. She has sinus problems she has problems with her teeth and has yet to find a dentist, she has ankle swelling, she bruises easily, she has back and joint pain which however is not more persistent or intense than before, and she is somewhat anxious and depressed. She wonders if she should have mammograms every 3 months aside from these issues a detailed review of systems today was stable  PAST MEDICAL HISTORY: Past Medical History  Diagnosis Date  . Breast cancer of upper-outer quadrant of right female breast ([place])   . Pancreatitis 2009  . Hypothyroidism   . Depression   . Reflux esophagitis     PAST SURGICAL HISTORY: Past Surgical History  Procedure Laterality Date  . Cesarean section    . Cesarean section with bilateral tubal ligation    . Thyroidectomy    . Cholecystectomy    . Exploratory laparotomy    . Mastectomy Right     FAMILY HISTORY No family history on file. The patient's father died from a stroke at the age of 13. The patient's mother died at  the age of 22. The patient had one brother, 2 sisters. One sister was diagnosed with breast cancer in her 70s. One maternal aunt was diagnosed with breast cancer in her 36s. There is no history of ovarian cancer in the family.  GYNECOLOGIC HISTORY:  No LMP recorded. Patient is postmenopausal. Menarche age 26, first live birth age 48, which the patient understands increases the risk of  breast cancer. She went through the change of life in 1989, and used hormone replacement for approximately 10 years, which also increases the risk of breast cancer developing. 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 She is currently looking for a paralegal position. She is divorced, and lives by herself with 2 [name]. Her son Aurellia Vitrano is a foreign service operative currently in [place]. He recently was married. The patient has no grandchildren. She attends a [employer].    ADVANCED DIRECTIVES: In place. The patient's son is her healthcare part turning   HEALTH MAINTENANCE: Social History  Substance Use Topics  . Smoking status: Never Smoker   . Smokeless tobacco: Not on file  . Alcohol Use: Not on file     Colonoscopy:  PAP:  Bone density:  Lipid panel:  Allergies  Allergen Reactions  . Codeine Nausea And Vomiting  . Gabapentin Nausea And Vomiting  . Sulfa Antibiotics Diarrhea    Current Outpatient Prescriptions  Medication Sig Dispense Refill  . beta carotene w/minerals (OCUVITE) tablet Take 1 tablet by mouth daily.    Marland Kitchen buPROPion (WELLBUTRIN XL) 150 MG 24 hr tablet Take 150 mg by mouth daily.     . cephALEXin (KEFLEX) 500 MG capsule Take 1 capsule (500 mg total) by mouth 4 (four) times daily. 28 capsule 0  . citalopram (CELEXA) 40 MG tablet Take 60 mg by mouth daily.    Marland Kitchen dicyclomine (BENTYL) 20 MG tablet Take 1 tablet (20 mg total) by mouth 3 (three) times daily as needed for spasms. 15 tablet 0  . diphenoxylate-atropine (LOMOTIL) 2.5-0.025 MG tablet Take 2 tablets by mouth 4 (four) times daily as needed for diarrhea or loose stools. Take 2 TABS PO 4 times daily until initial control of diarrhea achieved, then reduce dosage to amount necessary to maintain bowel control with maximum of 8 tablets/day 16 tablet 0  . levothyroxine (SYNTHROID,  LEVOTHROID) 75 MCG tablet Take 75 mcg by mouth daily before breakfast.    . lisdexamfetamine (VYVANSE) 50 MG capsule Take 70 mg by mouth.    . Multiple Vitamins-Minerals (HAIR/SKIN/NAILS) TABS Take 1 tablet by mouth daily.    . naproxen sodium (ANAPROX) 220 MG tablet Take 220 mg by mouth 2 (two) times daily as needed (for pain).    Marland Kitchen omeprazole (PRILOSEC) 10 MG capsule Take 20 mg by mouth daily.     . ondansetron (ZOFRAN ODT) 4 MG disintegrating tablet Take 1 tablet (4 mg total) by mouth every 8 (eight) hours as needed for nausea or vomiting. 15 tablet 0  . vitamin E (VITAMIN E) 200 UNIT capsule Take 200 Units by mouth daily.    Marland Kitchen VYVANSE 70 MG capsule Take 70 mg by mouth every morning.  0   No current facility-administered medications for this visit.    OBJECTIVE: Middle-aged white womanno acute distress Filed Vitals:   08/08/15 1215  BP: 121/66  Pulse: 89  Temp: 98 F (36.7 C)  Resp: 18  Body mass index is 24.52 kg/(m^2).    ECOG FS:1 - Symptomatic but completely ambulatory  Sclerae unicteric, pupils round and equal Oropharynx clear and moist-- no thrush or other lesions No cervical or supraclavicular adenopathy Lungs no rales or rhonchi Heart regular rate and rhythm Abd soft, nontender, positive bowel sounds MSK no focal spinal tenderness, no upper extremity lymphedema Neuro: nonfocal, well oriented, appropriate affect Breasts: The right breast is status post lumpectomy and radiation. There is no evidence of local recurrence. The right axilla is benign. The left breast is unremarkable   LAB RESULTS:  CMP     Component Value Date/Time   NA 144 08/01/2015 1013   NA 140 05/04/2015 1811   K 3.9 08/01/2015 1013   K 3.6 05/04/2015 1811   CL 104 05/04/2015 1811   CO2 25 08/01/2015 1013   CO2 22 05/04/2015 1811   GLUCOSE 95 08/01/2015 1013   GLUCOSE 107* 05/04/2015 1811   BUN 21.6 08/01/2015 1013   BUN 24* 05/04/2015 1811   CREATININE 0.8 08/01/2015 1013   CREATININE  0.81 05/04/2015 1811   CALCIUM 8.6 08/01/2015 1013   CALCIUM 9.8 05/04/2015 1811   PROT 6.3* 08/01/2015 1013   PROT 6.8 05/04/2015 2205   ALBUMIN 3.6 08/01/2015 1013   ALBUMIN 4.4 05/04/2015 2205   AST 25 08/01/2015 1013   AST 30 05/04/2015 2205   ALT 23 08/01/2015 1013   ALT 27 05/04/2015 2205   ALKPHOS 96 08/01/2015 1013   ALKPHOS 114 05/04/2015 2205   BILITOT 0.67 08/01/2015 1013   BILITOT 1.5* 05/04/2015 2205   GFRNONAA >60 05/04/2015 1811   GFRAA >60 05/04/2015 1811    INo results found for: SPEP, UPEP  Lab Results  Component Value Date   WBC 5.1 08/01/2015   NEUTROABS 3.2 08/01/2015   HGB 13.4 08/01/2015   HCT 39.8 08/01/2015   MCV 93.0 08/01/2015   PLT 185 08/01/2015      Chemistry      Component Value Date/Time   NA 144 08/01/2015 1013   NA 140 05/04/2015 1811   K 3.9 08/01/2015 1013   K 3.6 05/04/2015 1811   CL 104 05/04/2015 1811   CO2 25 08/01/2015 1013   CO2 22 05/04/2015 1811   BUN 21.6 08/01/2015 1013   BUN 24* 05/04/2015 1811   CREATININE 0.8 08/01/2015 1013   CREATININE 0.81 05/04/2015 1811      Component Value Date/Time   CALCIUM 8.6 08/01/2015 1013   CALCIUM 9.8 05/04/2015 1811   ALKPHOS 96 08/01/2015 1013   ALKPHOS 114 05/04/2015 2205   AST 25 08/01/2015 1013   AST 30 05/04/2015 2205   ALT 23 08/01/2015 1013   ALT 27 05/04/2015 2205   BILITOT 0.67 08/01/2015 1013   BILITOT 1.5* 05/04/2015 2205       Lab Results  Component Value Date   LABCA2 30 04/07/2015    No components found for: [id number]  No results for input(s): INR in the last 168 hours.  Urinalysis    Component Value Date/Time   COLORURINE YELLOW 05/05/2015 0154   APPEARANCEUR CLOUDY* 05/05/2015 0154   LABSPEC 1.023 05/05/2015 0154   PHURINE 6.0 05/05/2015 0154   GLUCOSEU NEGATIVE 05/05/2015 0154   HGBUR TRACE* 05/05/2015 0154   BILIRUBINUR NEGATIVE 05/05/2015 0154   KETONESUR 15* 05/05/2015 0154   PROTEINUR NEGATIVE 05/05/2015 0154   NITRITE NEGATIVE  05/05/2015 0154   LEUKOCYTESUR LARGE* 05/05/2015 0154    STUDIES: No results found.  ASSESSMENT: 70 y.o. [place],  [place] woman status post right breast upper outer quadrant biopsy 06/04/2013 for a clinical T2 N0, stage IIA invasive ductal carcinoma, triple negative, with an MIB-1 of 91%  (1) neoadjuvant chemotherapy consisted of doxorubicin and cyclophosphamide 4 followed by weekly Taxol 12  (a) some treatment delays with doxorubicin/cyclophosphamide cycles  (b) 20% dose reduction final for Taxol cycles  (2) status post right lumpectomy and sentinel lymph node sampling 02/19/2014 showing a complete pathologic response (ypT0, ypN0)  (3) status post adjuvant radiation  (4) genetics counseling pending  PLAN:  Tishauna remains very anxious regarding her breast cancer and we discussed the standard follow-up routine which includes visits every 6 months for at least the first 2 years and yearly mammography.  She had understood that she would have mammograms every 3 months, but I do not know anyone who is doing that and there is no data for every 6 month mammograms in the absence of specific findings that require follow-up.  She has not yet established herself with a primary care physician. I put in a referral for that today and I also referred her to  Psychiatry. Finally she is requesting a formal neuropsychological evaluation because she does not feel she can work as she used to be able to. I put in a referral regarding that as well. Hopefully all 3 referrals will come through and we will review them when she returns to see [place] again in 6 months.  In the meantime I recommended that she exercises on a regular basis.   She needs to have a Pap smear and to have her thyroid functions evaluated but I think it is best for her to simply establish herrself first with an internist and then proceed to further testing is needed.  She knows to call before her next visit here which will be in 6  months. Chauncey Cruel, MD   08/08/2015 12:18 PM Medical Oncology and Hematology [facility] [address] [place], [place] [address] Tel. [phone]    Fax. [phone] This discussed Magrin[place] okay

## 2015-08-08 NOTE — Telephone Encounter (Signed)
appt made and avs printed. Referrals pending to be sch

## 2015-08-24 DIAGNOSIS — M7501 Adhesive capsulitis of right shoulder: Secondary | ICD-10-CM | POA: Diagnosis not present

## 2015-08-24 DIAGNOSIS — G5601 Carpal tunnel syndrome, right upper limb: Secondary | ICD-10-CM | POA: Diagnosis not present

## 2015-08-24 DIAGNOSIS — M7502 Adhesive capsulitis of left shoulder: Secondary | ICD-10-CM | POA: Diagnosis not present

## 2015-08-24 NOTE — Telephone Encounter (Signed)
Per Dr. Jana Hakim- patient should see who her insurance company approves and

## 2015-09-05 ENCOUNTER — Telehealth: Payer: Self-pay | Admitting: *Deleted

## 2015-09-05 NOTE — Telephone Encounter (Signed)
Per message pt states she has not heard about appointments per last office visit MD was referring her for.  Obtained referrals per EPIC- and contacted Dr Baruch Goldmann office per need for neuropsycology- for ongoing migraines not breast cancer related. Per Kizzie in scheduling- referral did not cross over to their que due to information not entered correctly. Per this call Kizzie able to obtain referral- per office policy -Dr Valentina Shaggy will review for okay to proceed with referral per medical data.  This RN then called to Dr Launa Flight and obtained identified VM- message left per need for appointment.  This  RN then called pt per given number per message and obtained a VM for pt- message left to return call.  Noted per demographic page number not the same ( off by one number )- number per demographics contacted and verified that " no one is at this residence with that name ".  This RN removed incorrect phone number- correct number entered per voice mail from pt.

## 2015-09-21 DIAGNOSIS — M25572 Pain in left ankle and joints of left foot: Secondary | ICD-10-CM | POA: Diagnosis not present

## 2015-09-26 ENCOUNTER — Other Ambulatory Visit: Payer: Self-pay | Admitting: Oncology

## 2015-09-30 ENCOUNTER — Telehealth: Payer: Self-pay

## 2015-09-30 NOTE — Telephone Encounter (Signed)
Called today for Robin Valencia, Therapist, sports.  Patient wanted her to know that she has all appts in place except for psych/neuropsych and she is asking that you call her back in reference to this.

## 2015-10-25 ENCOUNTER — Encounter: Payer: Self-pay | Admitting: Internal Medicine

## 2015-10-25 ENCOUNTER — Other Ambulatory Visit: Payer: Self-pay | Admitting: Oncology

## 2015-10-25 ENCOUNTER — Other Ambulatory Visit (INDEPENDENT_AMBULATORY_CARE_PROVIDER_SITE_OTHER): Payer: Medicare Other

## 2015-10-25 ENCOUNTER — Ambulatory Visit (INDEPENDENT_AMBULATORY_CARE_PROVIDER_SITE_OTHER): Payer: Medicare Other | Admitting: Internal Medicine

## 2015-10-25 VITALS — BP 120/78 | HR 97 | Temp 98.6°F | Resp 16 | Wt 144.0 lb

## 2015-10-25 DIAGNOSIS — R413 Other amnesia: Secondary | ICD-10-CM

## 2015-10-25 DIAGNOSIS — K2100 Gastro-esophageal reflux disease with esophagitis, without bleeding: Secondary | ICD-10-CM

## 2015-10-25 DIAGNOSIS — R41840 Attention and concentration deficit: Secondary | ICD-10-CM

## 2015-10-25 DIAGNOSIS — E890 Postprocedural hypothyroidism: Secondary | ICD-10-CM

## 2015-10-25 DIAGNOSIS — F09 Unspecified mental disorder due to known physiological condition: Secondary | ICD-10-CM

## 2015-10-25 DIAGNOSIS — E039 Hypothyroidism, unspecified: Secondary | ICD-10-CM

## 2015-10-25 DIAGNOSIS — F329 Major depressive disorder, single episode, unspecified: Secondary | ICD-10-CM

## 2015-10-25 DIAGNOSIS — F32A Depression, unspecified: Secondary | ICD-10-CM

## 2015-10-25 DIAGNOSIS — B351 Tinea unguium: Secondary | ICD-10-CM

## 2015-10-25 LAB — VITAMIN B12: Vitamin B-12: 326 pg/mL (ref 211–911)

## 2015-10-25 LAB — TSH: TSH: 0.41 u[IU]/mL (ref 0.35–4.50)

## 2015-10-25 LAB — COMPREHENSIVE METABOLIC PANEL
ALK PHOS: 104 U/L (ref 39–117)
ALT: 25 U/L (ref 0–35)
AST: 24 U/L (ref 0–37)
Albumin: 4.3 g/dL (ref 3.5–5.2)
BILIRUBIN TOTAL: 0.8 mg/dL (ref 0.2–1.2)
BUN: 18 mg/dL (ref 6–23)
CALCIUM: 9.5 mg/dL (ref 8.4–10.5)
CO2: 26 mEq/L (ref 19–32)
CREATININE: 0.86 mg/dL (ref 0.40–1.20)
Chloride: 106 mEq/L (ref 96–112)
GFR: 69.68 mL/min (ref >60.00)
Glucose, Bld: 112 mg/dL — ABNORMAL HIGH (ref 70–99)
Potassium: 3.8 mEq/L (ref 3.5–5.1)
Sodium: 141 mEq/L (ref 135–145)
TOTAL PROTEIN: 6.9 g/dL (ref 6.0–8.3)

## 2015-10-25 LAB — FOLATE: FOLATE: 19.3 ng/mL (ref >5.9)

## 2015-10-25 LAB — T4, FREE: Free T4: 0.84 ng/dL (ref 0.60–1.60)

## 2015-10-25 MED ORDER — TERBINAFINE HCL 250 MG PO TABS: ORAL_TABLET | ORAL | Status: DC

## 2015-10-25 NOTE — Progress Notes (Signed)
   Subjective:    Patient ID: Robin Valencia, female    DOB: 1946/03/19, 70 y.o.   MRN: [id number]  HPI The patient is a new 70 YO female coming in for several concerns and continuation of her medical conditions. She moved from [place] in the last year or so and had no primary care there. She is recent history of breast cancer and feels that her brain has been affected. She is not as fast as she used to be and also problems with memory some. She has been getting told that her work in not as fast which is keeping her from getting long term employment.  She is also more tired and no change to her meds. This is also since her chemotherapy. She denies weight loss or fevers or chills. Her diet is not as good. No thyroid check in several years since her breast cancer discovered in 2015.  Also concerned about some toenail fungus on the right great toe. Has taken lamisil in the past. No skin rash or infection. They would not do this for her while she was on cancer treatment.  Please see A/P for status and treatment of chronic medical problems.   PMH, [facility], social history reviewed and updated.   Review of Systems  Constitutional: Negative for fever, chills, activity change, appetite change and unexpected weight change.  HENT: Negative.   Eyes: Negative.   Respiratory: Negative for cough, chest tightness and shortness of breath.   Cardiovascular: Negative for chest pain, palpitations and leg swelling.  Gastrointestinal: Negative for nausea, abdominal pain, diarrhea, constipation and abdominal distention.  Musculoskeletal: Negative.   Skin: Positive for color change.  Neurological: Positive for speech difficulty. Negative for dizziness, syncope, facial asymmetry, weakness, light-headedness and numbness.       Word finding difficulty  Psychiatric/Behavioral: Positive for confusion, dysphoric mood and decreased concentration. Negative for sleep disturbance. The patient is not nervous/anxious.         Objective:   Physical Exam  Constitutional: She is oriented to person, place, and time. She appears well-developed and well-nourished.  HENT:  Head: Normocephalic and atraumatic.  Eyes: EOM are normal.  Neck: Normal range of motion.  Cardiovascular: Normal rate and regular rhythm.   Pulmonary/Chest: Effort normal and breath sounds normal. No respiratory distress. She has no wheezes.  Abdominal: Soft. Bowel sounds are normal. She exhibits no distension. There is no tenderness. There is no rebound.  Musculoskeletal: She exhibits no edema.  Neurological: She is alert and oriented to person, place, and time. Coordination normal.  Skin: Skin is warm and dry.  Psychiatric:  Some word finding difficulty and wandering historian.   Filed Vitals:   10/25/15 1359  BP: 120/78  Pulse: 97  Temp: 98.6 F (37 C)  TempSrc: Oral  Resp: 16  Weight: 144 lb (65.318 kg)  SpO2: 98%      Assessment & Plan:

## 2015-10-25 NOTE — Progress Notes (Signed)
Pre visit review using our clinic review tool, if applicable. No additional management support is needed unless otherwise documented below in the visit note. 

## 2015-10-25 NOTE — Patient Instructions (Addendum)
We have sent in the lamisil. Take 1 pill daily for 1 week, then skip 3 weeks for 3 months. You will take 3 weeks total of medicine skipping 3 weeks after taking the 1 week.   We will work on getting you in with both the psychiatrist and the neuropsych person to do the assessment and testing.   We are checking labs today and will call with the results. We will let you know if we need to change any of your medicines.

## 2015-10-27 DIAGNOSIS — R413 Other amnesia: Secondary | ICD-10-CM | POA: Insufficient documentation

## 2015-10-27 DIAGNOSIS — B379 Candidiasis, unspecified: Secondary | ICD-10-CM | POA: Insufficient documentation

## 2015-10-27 DIAGNOSIS — R41840 Attention and concentration deficit: Secondary | ICD-10-CM | POA: Insufficient documentation

## 2015-10-27 NOTE — Assessment & Plan Note (Signed)
Taking omeprazole daily and symptoms if she misses dose.

## 2015-10-27 NOTE — Assessment & Plan Note (Signed)
Referral to neuropsych evaluation as she feels this started since her chemotherapy. Checking for metabolic causes today with thyroid, B12, CMP, folate.

## 2015-10-27 NOTE — Assessment & Plan Note (Signed)
Taking celexa and bupropion and slightly unclear if some of her memory problems could be uncontrolled depression. She does not admit to any mood problems right now. Referral to psych.

## 2015-10-27 NOTE — Assessment & Plan Note (Signed)
No levels checked in some time and can be related to some of her symptoms. Checking TSH and free T4 and adjust her synthroid 75 mcg daily as needed.

## 2015-10-27 NOTE — Assessment & Plan Note (Signed)
Since I do not treat attention problems she is referred to psych. She is still getting vyvanse from her [place] psychiatrist until she can get in.

## 2015-10-27 NOTE — Assessment & Plan Note (Signed)
Lamisil for 1 week, then stop for 3 weeks, then lamisil 1 week, then stop for 3 weeks then lamisil 1 week.

## 2015-10-28 ENCOUNTER — Ambulatory Visit
Admission: RE | Admit: 2015-10-28 | Discharge: 2015-10-28 | Disposition: A | Discharge status: Home / Self Care | Payer: Medicare Other | Source: Ambulatory Visit | Attending: Oncology | Admitting: Oncology

## 2015-10-28 ENCOUNTER — Other Ambulatory Visit: Payer: Self-pay | Admitting: Oncology

## 2015-10-28 DIAGNOSIS — C50411 Malignant neoplasm of upper-outer quadrant of right female breast: Secondary | ICD-10-CM

## 2015-10-28 DIAGNOSIS — R928 Other abnormal and inconclusive findings on diagnostic imaging of breast: Secondary | ICD-10-CM | POA: Diagnosis not present

## 2015-11-02 DIAGNOSIS — M25572 Pain in left ankle and joints of left foot: Secondary | ICD-10-CM | POA: Diagnosis not present

## 2015-12-08 ENCOUNTER — Telehealth: Payer: Self-pay | Admitting: *Deleted

## 2015-12-08 NOTE — Telephone Encounter (Signed)
Pt had left msg on triage stating she needing to get refills on her medications. Called pt back no answer LMOM RTC w/name of medications she is needing...[name]

## 2015-12-09 MED ORDER — LEVOTHYROXINE SODIUM 75 MCG PO TABS: 75.0000 ug | ORAL_TABLET | Freq: Every day | ORAL | 3 refills | Status: DC

## 2015-12-09 NOTE — Telephone Encounter (Signed)
Pt return call back she states she is needing new rx from Dr. Sharlet Salina. Her bottle has her old md information on it. Verified which pharmacy [facility] to [facility]...[name]

## 2015-12-09 NOTE — Addendum Note (Signed)
Addended by: Earnstine Regal on: 12/09/2015 12:54 PM   Modules accepted: Orders

## 2015-12-09 NOTE — Telephone Encounter (Signed)
Called pt again no answer LMOM RTC...Robin Valencia

## 2015-12-22 ENCOUNTER — Other Ambulatory Visit: Payer: Self-pay | Admitting: *Deleted

## 2015-12-22 MED ORDER — CITALOPRAM HYDROBROMIDE 40 MG PO TABS: 60.0000 mg | ORAL_TABLET | Freq: Every day | ORAL | 11 refills | Status: DC

## 2016-01-10 ENCOUNTER — Ambulatory Visit (HOSPITAL_COMMUNITY): Payer: Medicare Other | Admitting: Psychiatry

## 2016-01-12 ENCOUNTER — Telehealth: Payer: Self-pay

## 2016-01-12 NOTE — Telephone Encounter (Signed)
VYVANSE 70 MG capsule   Patient is requesting a refill on this medication.

## 2016-01-12 NOTE — Telephone Encounter (Signed)
We do not prescribe this medication for her. She needs to contact her psychiatrist.

## 2016-01-13 ENCOUNTER — Telehealth: Payer: Self-pay

## 2016-01-13 NOTE — Telephone Encounter (Signed)
Called pt no answer LMOM w/MD response../lmb 

## 2016-01-13 NOTE — Telephone Encounter (Signed)
Left message advising patient that dr Sharlet Salina has stated again that she does not prescribe this med, vyvanse---I have a possible opening for this patient to see greg calone on Tuesday 9/12 if she were to call back immediately---greg would need proof from patient's former psychiatrist that she has been diagnosed with the need to use vyvanse---and greg would possibly prescribe this med short term with a limited quanitity until she could get in with a local psychiatrist---I also have list of psychiatrist offices on tamara's desk that patient can use to try to locate local psychiatry office---any questions, can talk with tamara ----this med is not prescribed by our office and patient would need to seek refills from a psychiatry office of her choice

## 2016-01-21 DIAGNOSIS — H2513 Age-related nuclear cataract, bilateral: Secondary | ICD-10-CM | POA: Diagnosis not present

## 2016-01-23 ENCOUNTER — Other Ambulatory Visit: Payer: Self-pay | Admitting: *Deleted

## 2016-01-23 MED ORDER — OMEPRAZOLE 20 MG PO CPDR: 20.0000 mg | DELAYED_RELEASE_CAPSULE | Freq: Every day | ORAL | 1 refills | Status: DC

## 2016-01-23 NOTE — Telephone Encounter (Signed)
Left msg on triage stating call last week about getting refill on her Omeprazole. Checking status on refill. Called pt back no answer LMOM per chart no refill was done for omeprazole. Will send rx to [facility]...[name]

## 2016-01-24 MED ORDER — OMEPRAZOLE 20 MG PO CPDR: 20.0000 mg | DELAYED_RELEASE_CAPSULE | Freq: Two times a day (BID) | ORAL | 3 refills | Status: DC

## 2016-01-24 NOTE — Telephone Encounter (Signed)
Rec'd call pt states she didn't want the refill sent to [facility]. She mailed in a form last week from [facility]. She can get the medication cheaper w/that pharmacy. Inform pt will check w/MD assistant and fax rx to [facility]. Spoke w/Ami she had form. Completed form, printed script, faxing to [facility]...Robin Valencia

## 2016-01-24 NOTE — Addendum Note (Signed)
Addended by: Earnstine Regal on: 01/24/2016 12:16 PM   Modules accepted: Orders

## 2016-01-31 ENCOUNTER — Telehealth: Payer: Self-pay | Admitting: Internal Medicine

## 2016-01-31 ENCOUNTER — Other Ambulatory Visit: Payer: Self-pay | Admitting: Geriatric Medicine

## 2016-01-31 MED ORDER — OMEPRAZOLE 20 MG PO CPDR: 20.0000 mg | DELAYED_RELEASE_CAPSULE | Freq: Two times a day (BID) | ORAL | 3 refills | Status: DC

## 2016-01-31 NOTE — Telephone Encounter (Signed)
Sent to pharmacy 

## 2016-01-31 NOTE — Telephone Encounter (Signed)
Patient states script for omeprazole was suppose to be faxed over to [facility] last week.  Patient states she just spoke with company and they do not have the script.  Patient is requesting script to be refaxed and would like a call once this has been done.

## 2016-02-06 ENCOUNTER — Other Ambulatory Visit: Payer: Self-pay | Admitting: *Deleted

## 2016-02-06 ENCOUNTER — Telehealth: Payer: Self-pay | Admitting: *Deleted

## 2016-02-06 DIAGNOSIS — C50411 Malignant neoplasm of upper-outer quadrant of right female breast: Secondary | ICD-10-CM

## 2016-02-06 NOTE — Telephone Encounter (Signed)
"  I have an appointment tomorrow I'd forgotten about and need to cancel.  I will have to call back to reschedule."  Observed appointments with lab before F/U 02-14-2016 with [place] NP which will need to be rescheduled as well as she is no longer with this practice.

## 2016-02-07 ENCOUNTER — Other Ambulatory Visit: Payer: Medicare Other

## 2016-02-11 DIAGNOSIS — H6690 Otitis media, unspecified, unspecified ear: Secondary | ICD-10-CM | POA: Diagnosis not present

## 2016-02-11 DIAGNOSIS — J029 Acute pharyngitis, unspecified: Secondary | ICD-10-CM | POA: Diagnosis not present

## 2016-02-12 NOTE — Progress Notes (Deleted)
Psychiatric Initial Adult Assessment   Patient Identification: Robin Valencia MRN:  [id number] Date of Evaluation:  02/12/2016 Referral Source: *** Chief Complaint:   Visit Diagnosis: No diagnosis found.  History of Present Illness:   Robin Valencia is a 70 year old female with history of depression, right breast invasive ductal carcinoma s/p cyclophosphamide and doxorubicin between 07/23/2013 and 09/30/2013, paclitaxel weekly 12, with no treatment delays, but dose reduction of 20% with the final 4 doses. This was completed 01/14/2014, right mastectomy 02/19/2014  vyvanse   Associated Signs/Symptoms: Depression Symptoms:  {DEPRESSION SYMPTOMS:20000} (Hypo) Manic Symptoms:  {BHH MANIC SYMPTOMS:22872} Anxiety Symptoms:  {BHH ANXIETY SYMPTOMS:22873} Psychotic Symptoms:  {BHH PSYCHOTIC SYMPTOMS:22874} PTSD Symptoms: {BHH PTSD SYMPTOMS:22875}  Past Psychiatric History: ***  Previous Psychotropic Medications: {YES/NO:21197}  Substance Abuse History in the last 12 months:  {yes no:314532}  Consequences of Substance Abuse: {BHH CONSEQUENCES OF SUBSTANCE ABUSE:22880}  Past Medical History:  Past Medical History:  Diagnosis Date  . Arthritis   . Breast cancer of upper-outer quadrant of right female breast ([facility])   . Depression   . Eating disorder   . History of stomach ulcers   . Hypothyroidism   . Jaundice   . Pancreatitis 2009  . Reflux esophagitis     Past Surgical History:  Procedure Laterality Date  . BREAST SURGERY    . CESAREAN SECTION    . CESAREAN SECTION WITH BILATERAL TUBAL LIGATION    . CHOLECYSTECTOMY    . EXPLORATORY LAPAROTOMY    . MASTECTOMY Right   . THYROIDECTOMY    . TONSILLECTOMY      Family Psychiatric History: ***  Family History:  Family History  Problem Relation Age of Onset  . Arthritis Mother   . Mental illness Mother   . Alcohol abuse Father   . Hyperlipidemia Father   . Heart disease Father   . Stroke Father   . Hypertension Father    . Hypertension Sister   . Mental illness Sister   . Mental illness Brother   . Cancer Paternal Grandfather     prostate    Social History:   Social History   Social History  . Marital status: Divorced    Spouse name: N/A  . Number of children: N/A  . Years of education: N/A   Social History Main Topics  . Smoking status: Never Smoker  . Smokeless tobacco: Not on file  . Alcohol use Not on file  . Drug use: Unknown  . Sexual activity: Not on file   Other Topics Concern  . Not on file   Social History Narrative  . No narrative on file    Additional Social History: ***  Allergies:   Allergies  Allergen Reactions  . Codeine Nausea And Vomiting  . Gabapentin Nausea And Vomiting  . Sulfa Antibiotics Diarrhea    Metabolic Disorder Labs: No results found for: HGBA1C, MPG No results found for: PROLACTIN No results found for: CHOL, TRIG, HDL, CHOLHDL, VLDL, LDLCALC   Current Medications: Current Outpatient Prescriptions  Medication Sig Dispense Refill  . buPROPion (WELLBUTRIN XL) 150 MG 24 hr tablet Take 150 mg by mouth daily.     . citalopram (CELEXA) 40 MG tablet Take 1.5 tablets (60 mg total) by mouth daily. 45 tablet 11  . cyclobenzaprine (FLEXERIL) 10 MG tablet Take 10 mg by mouth as needed for muscle spasms.    Marland Kitchen levothyroxine (SYNTHROID, LEVOTHROID) 75 MCG tablet Take 1 tablet (75 mcg total) by mouth daily before breakfast. 90  tablet 3  . omeprazole (PRILOSEC) 20 MG capsule Take 1 capsule (20 mg total) by mouth 2 (two) times daily. 180 capsule 3  . terbinafine (LAMISIL) 250 MG tablet Take 1 daily for 1 week, then skip 3 weeks then take 1 week, then skip 3 weeks, then take 1 week. 21 tablet 0  . vitamin E (VITAMIN E) 200 UNIT capsule Take 200 Units by mouth daily.    Marland Kitchen VYVANSE 70 MG capsule TK ONE C PO QAM  0   No current facility-administered medications for this visit.     Neurologic: Headache: {BHH YES OR NO:22294} Seizure: {BHH YES OR  NO:22294} Paresthesias:{BHH YES OR JH:3695533  Musculoskeletal: Strength & Muscle Tone: {desc; muscle tone:32375} Gait & Station: {PE GAIT ED EF:6704556 Patient leans: {Patient Leans:21022755}  Psychiatric Specialty Exam: ROS  There were no vitals taken for this visit.There is no height or weight on file to calculate BMI.  General Appearance: {Appearance:22683}  Eye Contact:  {BHH EYE CONTACT:22684}  Speech:  {Speech:22685}  Volume:  {Volume (PAA):22686}  Mood:  {BHH MOOD:22306}  Affect:  {Affect (PAA):22687}  Thought Process:  {Thought Process (PAA):22688}  Orientation:  {BHH ORIENTATION (PAA):22689}  Thought Content:  {Thought Content:22690}  Suicidal Thoughts:  {ST/HT (PAA):22692}  Homicidal Thoughts:  {ST/HT (PAA):22692}  Memory:  {BHH MEMORY:22881}  Judgement:  {Judgement (PAA):22694}  Insight:  {Insight (PAA):22695}  Psychomotor Activity:  {Psychomotor (PAA):22696}  Concentration:  {Concentration:21399}  Recall:  {BHH GOOD/FAIR/POOR:22877}  Fund of Knowledge:{BHH GOOD/FAIR/POOR:22877}  Language: {BHH GOOD/FAIR/POOR:22877}  Akathisia:  {BHH YES OR NO:22294}  Handed:  {Handed:22697}  AIMS (if indicated):  ***  Assets:  {Assets (PAA):22698}  ADL's:  {BHH XO:4411959  Cognition: {chl bhh cognition:304700322}  Sleep:  ***    Treatment Plan Summary: {CHL AMB BH MD TX ME:[id number]   Norman Clay, MD 10/8/20176:40 PM

## 2016-02-13 ENCOUNTER — Ambulatory Visit (HOSPITAL_COMMUNITY): Payer: Medicare Other | Admitting: Psychiatry

## 2016-02-14 ENCOUNTER — Ambulatory Visit: Payer: Medicare Other | Admitting: Nurse Practitioner

## 2016-03-08 ENCOUNTER — Ambulatory Visit: Payer: Medicare Other | Admitting: Oncology

## 2016-03-15 NOTE — Progress Notes (Signed)
Psychiatric Initial Adult Assessment   Patient Identification: Robin Valencia MRN:  [id number] Date of Evaluation:  03/19/2016 Referral Source: Dr. Pricilla Holm Chief Complaint:  "I suffer from depression" Visit Diagnosis:    ICD-9-CM ICD-10-CM   1. Major depressive disorder, recurrent episode, moderate (HCC) 296.32 F33.1     History of Present Illness:   Robin Valencia is a 70 year old female with status post right breast invasive ductal carcinoma, s/p neoadjuvant chemotherapy consisted of doxorubicin and cyclophosphamide 4 followed by weekly Taxol 12, s/p right lumpectomy, adjuvant radiation who is referred for depression.   Patient states that she has been suffering from depression for 25 years. She feels it has been getting worse since she moved from [place]. She states that she moved here originally because her younger sister wanted to live with the patient. However, her sister changed her mind and did not want to live with patient anymore. She has a good support from her friend in [place], who visits the patient every other month. She has a son in [place] and is looking forward to her first grandchild to be born in Dec. 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 She believes that she had difficulty memory since she was started on chemotherapy in 2015.   She endorses hypersomnolence. She reports anhedonia and stays at home most of the time except she walks the dog, watching TV or cooking at times. She denies SI. She denies decreased need for sleep. She feels mild anxiety but denies panic attack. She feels "disorganized" and tends to get late for appointment or get easily distracted. She has been on Vyvanse for two years and finds it helpful for her concentration. She has bene out of this medication for a week. She denies alcohol use or drug use.   Associated  Signs/Symptoms: Depression Symptoms:  depressed mood, anhedonia, hypersomnia, fatigue, difficulty concentrating, anxiety, (Hypo) Manic Symptoms:  denies Anxiety Symptoms:  mild anxiety Psychotic Symptoms:  deniesa PTSD Symptoms: Had a traumatic exposure:  mother- verbally abusive  Past Psychiatric History:  Outpatient: since 2005 in [place], saw a counselor Psychiatry admission: 25 years ago for depression,  Previous suicide attempt: denies Past trials of medication: bupropion, Vyvanse, citalopram, Phenelzine,   Previous Psychotropic Medications: Yes   Substance Abuse History in the last 12 months:  No.  Consequences of Substance Abuse: NA  Past Medical History:  Past Medical History:  Diagnosis Date  . Arthritis   . Breast cancer of upper-outer quadrant of right female breast ([facility])   . Depression   . Eating disorder   . History of stomach ulcers   . Hypothyroidism   . Jaundice   . Pancreatitis 2009  . Reflux esophagitis     Past Surgical History:  Procedure Laterality Date  . BREAST SURGERY    . CESAREAN SECTION    . CESAREAN SECTION WITH BILATERAL TUBAL LIGATION    . CHOLECYSTECTOMY    . EXPLORATORY LAPAROTOMY    . MASTECTOMY Right   . THYROIDECTOMY    . TONSILLECTOMY      Family Psychiatric History: mother- depression, "paranoid" older brother/sister- depression, father- alcohol use, denies suicide attempt  Family History:  Family History  Problem Relation Age of Onset  . Arthritis Mother   . Mental illness Mother   . Alcohol abuse Father   . Hyperlipidemia Father   . Heart disease Father   .  Stroke Father   . Hypertension Father   . Hypertension Sister   . Mental illness Sister   . Mental illness Brother   . Cancer Paternal Grandfather     prostate    Social History:   Social History   Social History  . Marital status: Divorced    Spouse name: N/A  . Number of children: N/A  . Years of education: N/A   Social History Main Topics  .  Smoking status: Never Smoker  . Smokeless tobacco: Not on file  . Alcohol use Not on file  . Drug use: Unknown  . Sexual activity: Not on file   Other Topics Concern  . Not on file   Social History Narrative  . 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 Codeine Nausea And Vomiting  . Gabapentin Nausea And Vomiting  . Sulfa Antibiotics Diarrhea    Metabolic Disorder Labs: No results found for: HGBA1C, MPG No results found for: PROLACTIN No results found for: CHOL, TRIG, HDL, CHOLHDL, VLDL, LDLCALC   Current Medications: Current Outpatient Prescriptions  Medication Sig Dispense Refill  . buPROPion (WELLBUTRIN XL) 300 MG 24 hr tablet Take 1 tablet (300 mg total) by mouth daily. 90 tablet 0  . citalopram (CELEXA) 40 MG tablet Take 1.5 tablets (60 mg total) by mouth daily. (Patient taking differently: Take 30 mg by mouth daily. ) 45 tablet 11  . levothyroxine (SYNTHROID, LEVOTHROID) 75 MCG tablet Take 1 tablet (75 mcg total) by mouth daily before breakfast. 90 tablet 3  . omeprazole (PRILOSEC) 20 MG capsule Take 1 capsule (20 mg total) by mouth 2 (two) times daily. 180 capsule 3  . cyclobenzaprine (FLEXERIL) 10 MG tablet Take 10 mg by mouth as needed for muscle spasms.    Marland Kitchen terbinafine (LAMISIL) 250 MG tablet Take 1 daily for 1 week, then skip 3 weeks then take 1 week, then skip 3 weeks, then take 1 week. (Patient not taking: Reported on 03/19/2016) 21 tablet 0  . vitamin E (VITAMIN E) 200 UNIT capsule Take 200 Units by mouth daily.     No current facility-administered medications for this visit.     Neurologic: Headache: No Seizure: No Paresthesias:No  Musculoskeletal: Strength & Muscle Tone: within normal limits Gait & Station: normal Patient leans:  N/A  Psychiatric Specialty Exam: Review of Systems  Neurological: Negative for dizziness and tremors.  Psychiatric/Behavioral: Positive for depression and memory loss. Negative for hallucinations, substance abuse and suicidal ideas. The patient is nervous/anxious. The patient does not have insomnia.   All other systems reviewed and are negative.   Blood pressure 118/78, pulse 83, height 5\' 4"  (1.626 m), weight 141 lb 12.8 oz (64.3 kg).Body mass index is 24.34 kg/m.  General Appearance: Well Groomed  Eye Contact:  Good  Speech:  Clear and Coherent  Volume:  Normal  Mood:  Depressed  Affect:  Tearful  Thought Process:  Coherent and Goal Directed  Orientation:  Full (Time, Place, and Person)  Thought Content:  Logical Perceptions: denies AH/VH  Suicidal Thoughts:  No  Homicidal Thoughts:  No  Memory:  Immediate;   Good Recent;   Good Remote;   Good  Judgement:  Good  Insight:  Fair  Psychomotor Activity:  Normal  Concentration:  Concentration: Good and Attention Span: Good  Recall:  Good  Fund of Knowledge:Good  Language: Good  Akathisia:  No  Handed:  Right  AIMS (if indicated):  N/A  Assets:  Communication Skills Desire for Improvement  ADL's:  Intact  Cognition: WNL  Sleep:  hypersomnolence   Assessment Robin Valencia is a 70 year old female with depression, status post right breast invasive ductal carcinoma, s/p neoadjuvant chemotherapy consisted of doxorubicin and cyclophosphamide 4 followed by weekly Taxol 12, s/p right lumpectomy, adjuvant radiation who is referred for depression.   # MDD Patient endorses neurovegetative symptoms in the setting of relocation from [place] and discordance with her younger sister. Will increase citalopram to optimize its effect. May consider uptitration of wellbutrin if she continues to experience symptoms. She will greatly benefit from supportive therapy/CBT; will make a referral.   # r/o mild neurocognitive impairment Patient  endorses cognitive impairment since she underwent chemotherapy. It is likely that her current mental state of depression is also impacting on her cognition. She is referred to neuropsych testing per report. Will await for full evaluation. Noted that will hold Vyvanse at this time for better assessment.   Plan 1. Increase citalopram 40 mg daily 2. Continue Wellbutrin 300 mg daily 3. Hold Vyvanse  4. Return to clinic in one month 5. Will make a referral to therapist  The patient demonstrates the following risk factors for suicide: Chronic risk factors for suicide include: psychiatric disorder of depression. Acute risk factors for suicide include: family or marital conflict and social withdrawal/isolation. Protective factors for this patient include: positive social support, coping skills and hope for the future. Considering these factors, the overall suicide risk at this point appears to be low. Patient is appropriate for outpatient follow up.  Treatment Plan Summary: Plan as above   Norman Clay, MD 11/13/201711:09 AM

## 2016-03-19 ENCOUNTER — Encounter (INDEPENDENT_AMBULATORY_CARE_PROVIDER_SITE_OTHER): Payer: Self-pay

## 2016-03-19 ENCOUNTER — Ambulatory Visit (HOSPITAL_COMMUNITY): Payer: Medicare Other | Admitting: Psychiatry

## 2016-03-19 ENCOUNTER — Ambulatory Visit (INDEPENDENT_AMBULATORY_CARE_PROVIDER_SITE_OTHER): Payer: Medicare Other | Admitting: Psychiatry

## 2016-03-19 ENCOUNTER — Encounter (HOSPITAL_COMMUNITY): Payer: Self-pay | Admitting: Psychiatry

## 2016-03-19 VITALS — BP 118/78 | HR 83 | Ht 64.0 in | Wt 141.8 lb

## 2016-03-19 DIAGNOSIS — Z823 Family history of stroke: Secondary | ICD-10-CM

## 2016-03-19 DIAGNOSIS — Z8249 Family history of ischemic heart disease and other diseases of the circulatory system: Secondary | ICD-10-CM

## 2016-03-19 DIAGNOSIS — Z79899 Other long term (current) drug therapy: Secondary | ICD-10-CM | POA: Diagnosis not present

## 2016-03-19 DIAGNOSIS — F331 Major depressive disorder, recurrent, moderate: Secondary | ICD-10-CM | POA: Insufficient documentation

## 2016-03-19 DIAGNOSIS — Z818 Family history of other mental and behavioral disorders: Secondary | ICD-10-CM | POA: Diagnosis not present

## 2016-03-19 DIAGNOSIS — Z888 Allergy status to other drugs, medicaments and biological substances status: Secondary | ICD-10-CM

## 2016-03-19 DIAGNOSIS — Z8261 Family history of arthritis: Secondary | ICD-10-CM | POA: Diagnosis not present

## 2016-03-19 DIAGNOSIS — Z8042 Family history of malignant neoplasm of prostate: Secondary | ICD-10-CM

## 2016-03-19 MED ORDER — BUPROPION HCL ER (XL) 300 MG PO TB24: 300.0000 mg | ORAL_TABLET | Freq: Every day | ORAL | 0 refills | Status: DC

## 2016-03-19 NOTE — Patient Instructions (Signed)
1. Increase citalopram 40 mg daily 2. Continue Werllbutrin 300 mg daily 3. Hold vyvanse  4. Return to clinic in one month 5. Will make a referral to therapist

## 2016-03-20 DIAGNOSIS — R413 Other amnesia: Secondary | ICD-10-CM | POA: Diagnosis not present

## 2016-04-04 DIAGNOSIS — R413 Other amnesia: Secondary | ICD-10-CM | POA: Diagnosis not present

## 2016-04-16 ENCOUNTER — Encounter (HOSPITAL_COMMUNITY): Payer: Self-pay | Admitting: Psychology

## 2016-04-16 ENCOUNTER — Ambulatory Visit (INDEPENDENT_AMBULATORY_CARE_PROVIDER_SITE_OTHER): Payer: Medicare Other | Admitting: Psychology

## 2016-04-16 DIAGNOSIS — F331 Major depressive disorder, recurrent, moderate: Secondary | ICD-10-CM

## 2016-04-16 NOTE — Progress Notes (Signed)
Comprehensive Clinical Assessment (CCA) Note  04/16/2016 Robin Valencia [id number]  Visit Diagnosis:      ICD-9-CM ICD-10-CM   1. Major depressive disorder, recurrent episode, moderate (HCC) 296.32 F33.1       CCA Part One  Part One has been completed on paper by the patient.  (See scanned document in Chart Review)  CCA Part Two A  Intake/Chief Complaint:  CCA Intake With Chief Complaint CCA Part Two Date: 04/16/16 CCA Part Two Time: 55 Chief Complaint/Presenting Problem: Pt is referred by Dr. Modesta Messing who is tx pt for MDD, recurrent, moderate.  pt reports that she has dealt w/ depression for 25 years- was tx by psychiatrist in [place] and had seen a counselor when living in [place] for a couple of years.  Pt reported counseling was beneficial.  pt reported that depression has worsened since her move from [place] to [place] in August 2016.  pt reported she moved to live w/ her younger sister.  however this didn't work out ,as sister informed her after she moved into her apartment that she didn't want to live w/ her.  pt reported that she went through her savings in the couple of months it took to find employment.  she and sister do not interact anymore. pt reports that she moved to be closer to family and to her son who is in [place] when not traveling for his job as a foreign Research scientist (physical sciences).  pt reports that concentration and recall has been very poor since chemotherapy for breast cancer in 2015.  Pt reports her cancer has been in remission for 2 years.  recent neuropsychological testing indicated no cognitive disorders.   Patients Currently Reported Symptoms/Problems: Pt reports that everything just takes too much energy.  pt reports she is either sleeping or working.  pt reports difficulty initiating to get things done.  pt reports she feels tired and fatigued even after 10-12 hours of sleep.  pt also reports mood depressed and tearful.  Pt reports some worry about finacial stressors- current job  doesn't pay her bills and worry about her son safety related to his job.  pt reports that she will get together w/ friend she has made one time every 2 weeks and visiits older brother and sister occasionaly.   Collateral Involvement: Dr. Ivor Reining note.  [facility] report from 03/20/16 testing.  Individual's Strengths: seeking tx.  support of her older sister.  pt reports that she enjoys reading, watching tv, and her 2 dogs.   Individual's Preferences: tx for depression Type of Services Patient Feels Are Needed: counseling and medication management.   Mental Health Symptoms Depression:  Depression: Change in energy/activity, Difficulty Concentrating, Fatigue, Sleep (too much or little)  Mania:  Mania: N/A  Anxiety:   Anxiety: Difficulty concentrating, Fatigue, Worrying  Psychosis:  Psychosis: N/A  Trauma:  Trauma: N/A  Obsessions:  Obsessions: N/A  Compulsions:  Compulsions: N/A  Inattention:  Inattention: N/A  Hyperactivity/Impulsivity:  Hyperactivity/Impulsivity: N/A  Oppositional/Defiant Behaviors:  Oppositional/Defiant Behaviors: N/A  Borderline Personality:  Emotional Irregularity: N/A  Other Mood/Personality Symptoms:      Mental Status Exam Appearance and self-care  Stature:  Stature: Average  Weight:  Weight: Average weight  Clothing:  Clothing: Neat/clean  Grooming:  Grooming: Normal  Cosmetic use:  Cosmetic Use: Age appropriate  Posture/gait:  Posture/Gait: Normal  Motor activity:  Motor Activity: Not Remarkable  Sensorium  Attention:  Attention: Normal  Concentration:  Concentration: Normal  Orientation:  Orientation: X5  Recall/memory:  Recall/Memory:  Normal  Affect and Mood  Affect:  Affect: Tearful, Depressed  Mood:  Mood: Depressed  Relating  Eye contact:  Eye Contact: Normal  Facial expression:  Facial Expression: Depressed  Attitude toward examiner:  Attitude Toward Examiner: Cooperative  Thought and Language  Speech flow: Speech Flow:  Normal  Thought content:  Thought Content: Appropriate to mood and circumstances  Preoccupation:     Hallucinations:     Organization:     Transport planner of Knowledge:  Fund of Knowledge: Average  Intelligence:  Intelligence: Average  Abstraction:  Abstraction: Normal  Judgement:  Judgement: Normal  Reality Testing:  Reality Testing: Adequate  Insight:  Insight: Fair  Decision Making:  Decision Making: Normal  Social Functioning  Social Maturity:  Social Maturity: Responsible  Social Judgement:  Social Judgement: Normal  Stress  Stressors:  Stressors: Chiropodist, Transitions  Coping Ability:  Coping Ability: Deficient supports, English as a second language teacher Deficits:     Supports:      Family and Psychosocial History: Family history Marital status: Divorced Divorced, when?: Pt has been married 2 times.  last divorced in 2008 Does patient have children?: Yes How many children?: 1 How is patient's relationship with their children?: 67 y/o son lives in [place], married in 2016.  Granddaughter born 3 days ago.   Childhood History:  Childhood History By whom was/is the patient raised?: Both parents Does patient have siblings?: Yes Number of Siblings: 3 Description of patient's current relationship with siblings: pt has older sister who lives in [place], [place] and older brother who is in [place] care in [place], [place].   pt has a younger sister who she came to area to live w/ but this didn't work out and now they no longer interact.  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  How long has patient been employed?: since October 2016 Patient's job has been impacted by current illness: Yes Describe how patient's job has been impacted: struggles to get to work on time.  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, hospital; paralegal Has patient ever been in the [employer]?: No Has patient ever served in combat?: No Are There Guns or Other Weapons in [place]?: No  Education: Education Last Grade Completed: 16 Did Teacher, adult education From [employer]?: Yes Did Physicist, medical?: Yes What Type of College Degree Do you Have?: English Did You Attend Graduate School?: Yes What Was Your Major?: paralegal Did You Have An Individualized Education Program (IIEP): No Did You Have Any Difficulty At School?: No  Religion: Religion/Spirituality Are You A Religious Person?: Yes  Leisure/Recreation: Leisure / Recreation Leisure and [place]: dogs, gardening, reading, tv  Exercise/Diet: Exercise/Diet Do You Exercise?: No Have You Gained or Lost A Significant Amount of Weight in the Past Six Months?: No Do You Follow a Special Diet?: No Do You Have Any Trouble Sleeping?: Yes Explanation of Sleeping Difficulties: sleeping too  much, fatigued and no energy  CCA Part Two  C  Alcohol/Drug Use: Alcohol / Drug Use Prescriptions: takes as prescribed History of alcohol / drug use?: No history of alcohol / drug abuse                      CCA Part Three  ASAM's:  Six Dimensions of Multidimensional Assessment  Dimension 1:  Acute Intoxication and/or Withdrawal Potential:     Dimension 2:  Biomedical Conditions and Complications:     Dimension 3:  Emotional, Behavioral, or Cognitive Conditions and Complications:     Dimension 4:  Readiness to Change:     Dimension 5:  Relapse, Continued use, or Continued Problem  Potential:     Dimension 6:  Recovery/Living Environment:      Substance use Disorder (SUD)    Social Function:  Social Functioning Social Maturity: Responsible Social Judgement: Normal  Stress:  Stress Stressors: Money, Transitions Coping Ability: Deficient supports, Overwhelmed Patient Takes Medications The Way The Doctor Instructed?: Yes Priority Risk: Low Acuity  Risk Assessment- Self-Harm Potential: Risk Assessment For Self-Harm Potential Thoughts of Self-Harm: No current thoughts  Risk Assessment -Dangerous to Others Potential: Risk Assessment For Dangerous to Others Potential Method: No Plan  DSM5 Diagnoses: Patient Active Problem List   Diagnosis Date Noted  . Major depressive disorder, recurrent episode, moderate ([facility]) 03/19/2016  . Attention and concentration deficit 10/27/2015  . Toenail fungus 10/27/2015  . Memory difficulties 10/27/2015  . Hypothyroidism, 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/08/2015  . Depression 04/08/2015  . Reflux esophagitis 04/08/2015  . Breast cancer of upper-outer quadrant of right female breast ([facility]) 04/06/2015    Patient Centered Plan: Patient is on the following Treatment Plan(s):  Depression to be developed w/ pt next session per pt goals.   Recommendations for Services/Supports/Treatments: Recommendations for Services/Supports/Treatments Recommendations For Services/Supports/Treatments: Individual Therapy, Medication Management  Treatment Plan Summary:   Pt to f/u w/ weekly to biweekly counseling for tx of MDD, recurrent.   Jan Fireman

## 2016-04-16 NOTE — Progress Notes (Signed)
[place] MD/PA/NP OP Progress Note  04/18/2016 11:11 AM Robin Valencia  MRN:  [id number]  Chief Complaint:  Chief Complaint    Depression; Follow-up     Subjective:  "I sleep all the time" HPI:  - Per neuropsych test at [facility]; Dx normal cognitive functioning, MDD with anxious mood   Patient states that she feels a lot less anxious since the last visit. However she does not feel good overall and struggled with depression. She reports that her son will have a baby but she does not feel excited as she would expected. She is looking forward to seeing her friend from [place], who she speaks every day. She also talks about another friend in her area. She endorses fatigue and sleeps all the time on weekends. She goes to work and reports it is manageable. She enjoys reading and watching TV at times. She denies SI. She denies panic attack.   Visit Diagnosis:    ICD-9-CM ICD-10-CM   1. Major depressive disorder, recurrent episode, moderate (HCC) 296.32 F33.1     Past Psychiatric History:  Outpatient: since 2005 in [place], saw a counselor Psychiatry admission: 25 years ago for depression,  Previous suicide attempt: denies Past trials of medication: bupropion, Vyvanse, citalopram, Phenelzine,   Past Medical History:  Past Medical History:  Diagnosis Date  . Arthritis   . Breast cancer of upper-outer quadrant of right female breast ([place])   . Depression   . Eating disorder   . History of stomach ulcers   . Hypothyroidism   . Jaundice   . Pancreatitis 2009  . Reflux esophagitis     Past Surgical History:  Procedure Laterality Date  . BREAST SURGERY    . CESAREAN SECTION    . CESAREAN SECTION WITH BILATERAL TUBAL LIGATION    . CHOLECYSTECTOMY    . EXPLORATORY LAPAROTOMY    . MASTECTOMY Right   . THYROIDECTOMY    . TONSILLECTOMY      Family Psychiatric History:  mother- depression, "paranoid" older brother/sister- depression, father- alcohol use, denies suicide  attempt  Family History:  Family History  Problem Relation Age of Onset  . Arthritis Mother   . Mental illness Mother   . Depression Mother   . Alcohol abuse Father   . Hyperlipidemia Father   . Heart disease Father   . Stroke Father   . Hypertension Father   . Hypertension Sister   . Mental illness Sister   . Mental illness Brother   . Depression Brother   . Cancer Paternal Grandfather     prostate    Social History:  Social History   Social History  . Marital status: Divorced    Spouse name: N/A  . Number of children: N/A  . Years of education: N/A   Social History Main Topics  . Smoking status: Never Smoker  . Smokeless tobacco: Never Used  . Alcohol use No  . Drug use: No  . Sexual activity: No   Other Topics Concern  . None   Social History Narrative  . 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 Codeine Nausea And Vomiting  . Gabapentin Nausea And Vomiting  . Sulfa Antibiotics Diarrhea    Metabolic Disorder Labs: No results found for: HGBA1C, MPG No results found for: PROLACTIN No results found for: CHOL, TRIG, HDL,  CHOLHDL, VLDL, LDLCALC   Current Medications: Current Outpatient Prescriptions  Medication Sig Dispense Refill  . buPROPion (WELLBUTRIN XL) 150 MG 24 hr tablet Take total of 450 mg per day (300 mg +150 mg tab) 90 tablet 1  . buPROPion (WELLBUTRIN XL) 300 MG 24 hr tablet Take 1 tablet (300 mg total) by mouth daily. 90 tablet 0  . citalopram (CELEXA) 40 MG tablet Take 1.5 tablets (60 mg total) by mouth daily. (Patient taking differently: Take 40 mg by mouth daily. ) 45 tablet 11  . cyclobenzaprine (FLEXERIL) 10 MG tablet Take 10 mg by mouth as needed for muscle spasms.    Marland Kitchen levothyroxine (SYNTHROID, LEVOTHROID) 75 MCG tablet Take 1 tablet (75 mcg total) by mouth daily before  breakfast. 90 tablet 3  . omeprazole (PRILOSEC) 20 MG capsule Take 1 capsule (20 mg total) by mouth 2 (two) times daily. 180 capsule 3  . terbinafine (LAMISIL) 250 MG tablet Take 1 daily for 1 week, then skip 3 weeks then take 1 week, then skip 3 weeks, then take 1 week. (Patient not taking: Reported on 03/19/2016) 21 tablet 0  . vitamin E (VITAMIN E) 200 UNIT capsule Take 200 Units by mouth daily.     No current facility-administered medications for this visit.     Neurologic: Headache: No Seizure: No Paresthesias: No  Musculoskeletal: Strength & Muscle Tone: within normal limits Gait & Station: normal Patient leans: N/A  Psychiatric Specialty Exam: Review of Systems  Psychiatric/Behavioral: Positive for depression. Negative for hallucinations, substance abuse and suicidal ideas. The patient is nervous/anxious and has insomnia.   All other systems reviewed and are negative.   Blood pressure 118/66, pulse 88, height 5\' 4"  (1.626 m), weight 147 lb (66.7 kg).Body mass index is 25.23 kg/m.  General Appearance: Fairly Groomed  Eye Contact:  Good  Speech:  Clear and Coherent  Volume:  Normal  Mood:  "not good"  Affect:  down- improving  Thought Process:  Coherent and Goal Directed  Orientation:  Full (Time, Place, and Person)  Thought Content: Logical  Perceptions: denies AH/VH  Suicidal Thoughts:  No  Homicidal Thoughts:  No  Memory:  Immediate;   Good Recent;   Good Remote;   Good  Judgement:  Good  Insight:  Good  Psychomotor Activity:  Normal  Concentration:  Concentration: Good and Attention Span: Good  Recall:  Good  Fund of Knowledge: Good  Language: Good  Akathisia:  No  Handed:  Right  AIMS (if indicated):  N/A  Assets:  Communication Skills Desire for Improvement  ADL's:  Intact  Cognition: WNL  Sleep:  poor   Assessment Robin Valencia is a 70 year old female with depression, status post right breast invasive ductal carcinoma, s/p neoadjuvant chemotherapy  consisted of doxorubicin and cyclophosphamide 4 followed by weekly Taxol 12, s/p right lumpectomy, adjuvant radiation who is referred for depression. Psychosocial stressors including relocation from [place] and discordance with her younger sister.  # MDD There is a slight improvement in her neurovegetative since uptitration of citalopram. Will increase Wellbutrin to optimize its effect. Discussed side effects which includes headache, seizure. She will greatly benefit from supportive therapy/CBT; referral made. Of note, patient has neuropsych evaluation and normal cognition is reported. Will continue to hold Vyvanse at this time given risk of dependence and worsening anxiety. Will consider restarting this medication if she undergoes treatment refractory depression. Patient agrees with plans.  Plan 1. Continue citalopram 40 mg daily 2. Increase Wellbutrin 450 mg daily  3. Return to clinic in January  The patient demonstrates the following risk factors for suicide: Chronic risk factors for suicide include: psychiatric disorder of depression. Acute risk factors for suicide include: family or marital conflict and social withdrawal/isolation. Protective factors for this patient include: positive social support, coping skills and hope for the future. Considering these factors, the overall suicide risk at this point appears to be low. Patient is appropriate for outpatient follow up.  Treatment Plan Summary:Plan as above   Norman Clay, MD 04/18/2016, 11:11 AM

## 2016-04-18 ENCOUNTER — Encounter (HOSPITAL_COMMUNITY): Payer: Self-pay | Admitting: Psychiatry

## 2016-04-18 ENCOUNTER — Ambulatory Visit (INDEPENDENT_AMBULATORY_CARE_PROVIDER_SITE_OTHER): Payer: Medicare Other | Admitting: Psychiatry

## 2016-04-18 VITALS — BP 118/66 | HR 88 | Ht 64.0 in | Wt 147.0 lb

## 2016-04-18 DIAGNOSIS — Z9889 Other specified postprocedural states: Secondary | ICD-10-CM

## 2016-04-18 DIAGNOSIS — Z823 Family history of stroke: Secondary | ICD-10-CM | POA: Diagnosis not present

## 2016-04-18 DIAGNOSIS — Z882 Allergy status to sulfonamides status: Secondary | ICD-10-CM

## 2016-04-18 DIAGNOSIS — Z8042 Family history of malignant neoplasm of prostate: Secondary | ICD-10-CM

## 2016-04-18 DIAGNOSIS — Z818 Family history of other mental and behavioral disorders: Secondary | ICD-10-CM

## 2016-04-18 DIAGNOSIS — Z811 Family history of alcohol abuse and dependence: Secondary | ICD-10-CM | POA: Diagnosis not present

## 2016-04-18 DIAGNOSIS — Z79899 Other long term (current) drug therapy: Secondary | ICD-10-CM | POA: Diagnosis not present

## 2016-04-18 DIAGNOSIS — F331 Major depressive disorder, recurrent, moderate: Secondary | ICD-10-CM | POA: Diagnosis not present

## 2016-04-18 DIAGNOSIS — Z8249 Family history of ischemic heart disease and other diseases of the circulatory system: Secondary | ICD-10-CM

## 2016-04-18 DIAGNOSIS — Z8261 Family history of arthritis: Secondary | ICD-10-CM

## 2016-04-18 DIAGNOSIS — Z888 Allergy status to other drugs, medicaments and biological substances status: Secondary | ICD-10-CM | POA: Diagnosis not present

## 2016-04-18 MED ORDER — BUPROPION HCL ER (XL) 150 MG PO TB24: ORAL_TABLET | ORAL | 1 refills | Status: DC

## 2016-04-18 NOTE — Patient Instructions (Signed)
1. Continue citalopram 40 mg daily 2. Increase Wellbutrin 450 mg daily 3. Return to clinic in January

## 2016-04-19 DIAGNOSIS — J0141 Acute recurrent pansinusitis: Secondary | ICD-10-CM | POA: Diagnosis not present

## 2016-04-19 DIAGNOSIS — R0602 Shortness of breath: Secondary | ICD-10-CM | POA: Diagnosis not present

## 2016-04-19 DIAGNOSIS — R05 Cough: Secondary | ICD-10-CM | POA: Diagnosis not present

## 2016-04-19 DIAGNOSIS — R0600 Dyspnea, unspecified: Secondary | ICD-10-CM | POA: Diagnosis not present

## 2016-05-14 ENCOUNTER — Ambulatory Visit (HOSPITAL_COMMUNITY): Payer: Self-pay | Admitting: Psychology

## 2016-05-21 ENCOUNTER — Ambulatory Visit (HOSPITAL_COMMUNITY): Payer: Self-pay | Admitting: Psychology

## 2016-05-28 ENCOUNTER — Ambulatory Visit (HOSPITAL_COMMUNITY): Payer: Self-pay | Admitting: Psychology

## 2016-06-04 ENCOUNTER — Ambulatory Visit (HOSPITAL_COMMUNITY): Payer: Self-pay | Admitting: Psychology

## 2016-06-11 ENCOUNTER — Ambulatory Visit (HOSPITAL_COMMUNITY): Payer: Self-pay | Admitting: Psychiatry

## 2016-06-12 ENCOUNTER — Encounter (HOSPITAL_COMMUNITY): Payer: Self-pay | Admitting: Psychology

## 2016-06-12 ENCOUNTER — Ambulatory Visit (HOSPITAL_COMMUNITY): Payer: Self-pay | Admitting: Psychology

## 2016-06-12 NOTE — Progress Notes (Signed)
Robin Valencia is a 71 y.o. female patient who didn't show for her appointment.  Letter sent.        Jan Fireman, LPC

## 2016-06-21 NOTE — Progress Notes (Deleted)
[place] MD/PA/NP OP Progress Note  06/21/2016 1:07 PM Robin Valencia  MRN:  [id number]  Chief Complaint:   Subjective:  "I sleep all the time" HPI:  - Per neuropsych test at [facility]; Dx normal cognitive functioning, MDD with anxious mood   Patient states that she feels a lot less anxious since the last visit. However she does not feel good overall and struggled with depression. She reports that her son will have a baby but she does not feel excited as she would expected. She is looking forward to seeing her friend from [place], who she speaks every day. She also talks about another friend in her area. She endorses fatigue and sleeps all the time on weekends. She goes to work and reports it is manageable. She enjoys reading and watching TV at times. She denies SI. She denies panic attack.   Visit Diagnosis:  No diagnosis found.  Past Psychiatric History:  Outpatient: since 2005 in [place], saw a counselor Psychiatry admission: 25 years ago for depression,  Previous suicide attempt: denies Past trials of medication: bupropion, Vyvanse, citalopram, Phenelzine,   Past Medical History:  Past Medical History:  Diagnosis Date  . Arthritis   . Breast cancer of upper-outer quadrant of right female breast ([place])   . Depression   . Eating disorder   . History of stomach ulcers   . Hypothyroidism   . Jaundice   . Pancreatitis 2009  . Reflux esophagitis     Past Surgical History:  Procedure Laterality Date  . BREAST SURGERY    . CESAREAN SECTION    . CESAREAN SECTION WITH BILATERAL TUBAL LIGATION    . CHOLECYSTECTOMY    . EXPLORATORY LAPAROTOMY    . MASTECTOMY Right   . THYROIDECTOMY    . TONSILLECTOMY      Family Psychiatric History:  mother- depression, "paranoid" older brother/sister- depression, father- alcohol use, denies suicide attempt  Family History:  Family History  Problem Relation Age of Onset  . Arthritis Mother   . Mental illness Mother   .  Depression Mother   . Alcohol abuse Father   . Hyperlipidemia Father   . Heart disease Father   . Stroke Father   . Hypertension Father   . Hypertension Sister   . Mental illness Sister   . Mental illness Brother   . Depression Brother   . Cancer Paternal Grandfather     prostate    Social History:  Social History   Social History  . Marital status: Divorced    Spouse name: N/A  . Number of children: N/A  . Years of education: N/A   Social History Main Topics  . Smoking status: Never Smoker  . Smokeless tobacco: Never Used  . Alcohol use No  . Drug use: No  . Sexual activity: No   Other Topics Concern  . Not on file   Social History Narrative  . 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 Codeine Nausea And Vomiting  . Gabapentin Nausea And Vomiting  . Sulfa Antibiotics Diarrhea    Metabolic Disorder Labs: No results found for: HGBA1C, MPG No results found for: PROLACTIN No results found for: CHOL, TRIG, HDL, CHOLHDL, VLDL, LDLCALC   Current Medications: Current Outpatient Prescriptions  Medication Sig Dispense Refill  . buPROPion (WELLBUTRIN XL) 150  MG 24 hr tablet Take total of 450 mg per day (300 mg +150 mg tab) 90 tablet 1  . buPROPion (WELLBUTRIN XL) 300 MG 24 hr tablet Take 1 tablet (300 mg total) by mouth daily. 90 tablet 0  . citalopram (CELEXA) 40 MG tablet Take 1.5 tablets (60 mg total) by mouth daily. (Patient taking differently: Take 40 mg by mouth daily. ) 45 tablet 11  . cyclobenzaprine (FLEXERIL) 10 MG tablet Take 10 mg by mouth as needed for muscle spasms.    Marland Kitchen levothyroxine (SYNTHROID, LEVOTHROID) 75 MCG tablet Take 1 tablet (75 mcg total) by mouth daily before breakfast. 90 tablet 3  . omeprazole (PRILOSEC) 20 MG capsule Take 1 capsule (20 mg total) by mouth 2  (two) times daily. 180 capsule 3  . terbinafine (LAMISIL) 250 MG tablet Take 1 daily for 1 week, then skip 3 weeks then take 1 week, then skip 3 weeks, then take 1 week. (Patient not taking: Reported on 03/19/2016) 21 tablet 0  . vitamin E (VITAMIN E) 200 UNIT capsule Take 200 Units by mouth daily.     No current facility-administered medications for this visit.     Neurologic: Headache: No Seizure: No Paresthesias: No  Musculoskeletal: Strength & Muscle Tone: within normal limits Gait & Station: normal Patient leans: N/A  Psychiatric Specialty Exam: Review of Systems  Psychiatric/Behavioral: Positive for depression. Negative for hallucinations, substance abuse and suicidal ideas. The patient is nervous/anxious and has insomnia.   All other systems reviewed and are negative.   There were no vitals taken for this visit.There is no height or weight on file to calculate BMI.  General Appearance: Fairly Groomed  Eye Contact:  Good  Speech:  Clear and Coherent  Volume:  Normal  Mood:  "not good"  Affect:  down- improving  Thought Process:  Coherent and Goal Directed  Orientation:  Full (Time, Place, and Person)  Thought Content: Logical  Perceptions: denies AH/VH  Suicidal Thoughts:  No  Homicidal Thoughts:  No  Memory:  Immediate;   Good Recent;   Good Remote;   Good  Judgement:  Good  Insight:  Good  Psychomotor Activity:  Normal  Concentration:  Concentration: Good and Attention Span: Good  Recall:  Good  Fund of Knowledge: Good  Language: Good  Akathisia:  No  Handed:  Right  AIMS (if indicated):  N/A  Assets:  Communication Skills Desire for Improvement  ADL's:  Intact  Cognition: WNL  Sleep:  poor   Assessment Robin Valencia is a 71 year old female with depression, status post right breast invasive ductal carcinoma, s/p neoadjuvant chemotherapy consisted of doxorubicin and cyclophosphamide 4 followed by weekly Taxol 12, s/p right lumpectomy, adjuvant radiation  who is referred for depression. Psychosocial stressors including relocation from [place] and discordance with her younger sister.  # MDD There is a slight improvement in her neurovegetative since uptitration of citalopram. Will increase Wellbutrin to optimize its effect. Discussed side effects which includes headache, seizure. She will greatly benefit from supportive therapy/CBT; referral made. Of note, patient has neuropsych evaluation and normal cognition is reported. Will continue to hold Vyvanse at this time given risk of dependence and worsening anxiety. Will consider restarting this medication if she undergoes treatment refractory depression. Patient agrees with plans.  Plan 1. Continue citalopram 40 mg daily 2. Increase Wellbutrin 450 mg daily 3. Return to clinic in January  The patient demonstrates the following risk factors for suicide: Chronic risk factors for suicide include: psychiatric  disorder of depression. Acute risk factors for suicide include: family or marital conflict and social withdrawal/isolation. Protective factors for this patient include: positive social support, coping skills and hope for the future. Considering these factors, the overall suicide risk at this point appears to be low. Patient is appropriate for outpatient follow up.  Treatment Plan Summary:Plan as above   Norman Clay, MD 06/21/2016, 1:07 PM

## 2016-06-25 ENCOUNTER — Ambulatory Visit (HOSPITAL_COMMUNITY): Payer: Self-pay | Admitting: Psychiatry

## 2016-07-02 ENCOUNTER — Ambulatory Visit (HOSPITAL_COMMUNITY): Payer: Self-pay | Admitting: Psychiatry

## 2016-07-04 DIAGNOSIS — M19012 Primary osteoarthritis, left shoulder: Secondary | ICD-10-CM | POA: Diagnosis not present

## 2016-07-16 ENCOUNTER — Ambulatory Visit (HOSPITAL_COMMUNITY): Payer: Self-pay | Admitting: Psychiatry

## 2016-07-31 ENCOUNTER — Other Ambulatory Visit: Payer: Self-pay | Admitting: Psychiatry

## 2016-07-31 MED ORDER — BUPROPION HCL ER (XL) 150 MG PO TB24: ORAL_TABLET | ORAL | 0 refills | Status: DC

## 2016-07-31 MED ORDER — BUPROPION HCL ER (XL) 300 MG PO TB24: 300.0000 mg | ORAL_TABLET | Freq: Every day | ORAL | 0 refills | Status: DC

## 2016-07-31 NOTE — Progress Notes (Signed)
Patient sent refill request for wellbutrin along with prescription renewal form for RX outreach. Will renew and fax now with new script.

## 2016-08-02 ENCOUNTER — Telehealth: Payer: Self-pay | Admitting: Oncology

## 2016-08-02 NOTE — Telephone Encounter (Signed)
Received call from pt to r/s missed appt in Nov. Pt has new appt date/time 5/3 at 0900

## 2016-08-06 DIAGNOSIS — M7501 Adhesive capsulitis of right shoulder: Secondary | ICD-10-CM | POA: Diagnosis not present

## 2016-08-06 DIAGNOSIS — M19012 Primary osteoarthritis, left shoulder: Secondary | ICD-10-CM | POA: Diagnosis not present

## 2016-08-10 ENCOUNTER — Encounter (HOSPITAL_COMMUNITY): Payer: Self-pay | Admitting: Psychiatry

## 2016-08-10 ENCOUNTER — Ambulatory Visit (INDEPENDENT_AMBULATORY_CARE_PROVIDER_SITE_OTHER): Payer: Medicare Other | Admitting: Psychiatry

## 2016-08-10 VITALS — BP 120/70 | HR 81 | Ht 64.0 in | Wt 146.8 lb

## 2016-08-10 DIAGNOSIS — Z811 Family history of alcohol abuse and dependence: Secondary | ICD-10-CM

## 2016-08-10 DIAGNOSIS — Z818 Family history of other mental and behavioral disorders: Secondary | ICD-10-CM | POA: Diagnosis not present

## 2016-08-10 DIAGNOSIS — F331 Major depressive disorder, recurrent, moderate: Secondary | ICD-10-CM | POA: Diagnosis not present

## 2016-08-10 DIAGNOSIS — R41840 Attention and concentration deficit: Secondary | ICD-10-CM

## 2016-08-10 DIAGNOSIS — Z79899 Other long term (current) drug therapy: Secondary | ICD-10-CM

## 2016-08-10 MED ORDER — BUPROPION HCL ER (XL) 450 MG PO TB24: 450.0000 mg | ORAL_TABLET | Freq: Every day | ORAL | 0 refills | Status: DC

## 2016-08-10 MED ORDER — CITALOPRAM HYDROBROMIDE 40 MG PO TABS: 40.0000 mg | ORAL_TABLET | Freq: Every day | ORAL | 0 refills | Status: DC

## 2016-08-10 NOTE — Progress Notes (Signed)
[facility] MD/PA/NP OP Progress Note  08/10/2016 12:36 PM Haleemah Buckalew  MRN:  [id number]  Chief Complaint:  Subjective:  [name] presents today for psychiatric follow-up of depression. We spent time discussing some of her behavioral habits, and sleep habits. We spent time discussing some activating tools she can use to help herself not sleep during the day when she is not working. 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 She continues to speak with her son periodically, as he is out of country for his work. 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  She reports that she is definitely less tearful with the Celexa 40 mg and Wellbutrin 300 mg. However she still feels discouraged at times, has low motivation and energy to pursue a new job, has difficulty concentrating, and still feels that she needs an ADHD medicine. She reports that she was on Vyvanse in the past which was effective. We discussed increasing Wellbutrin to 450 mg, and the associated risks and benefits. Discussed that this can be helpful for ADHD symptoms and depression. She would like a referral for a PhD therapist, so that she can continue working on some of the behavioral strategies we discussed.  Visit Diagnosis:    ICD-9-CM ICD-10-CM   1. Major depressive disorder, recurrent episode, moderate (HCC) 296.32 F33.1 citalopram (CELEXA) 40 MG tablet     BuPROPion HCl ER, XL, 450 MG TB24     Ambulatory referral to Psychology     DISCONTINUED: buPROPion 450 MG TB24  2. Attention and concentration deficit 799.51 R41.840    Past Psychiatric History: See intake H&P for full details. Reviewed, with no updates at this time.   Past Medical History:  Past Medical History:  Diagnosis Date  . Arthritis   . Breast cancer of upper-outer quadrant of right female breast ([place])   . Depression   . Eating disorder   . History of stomach ulcers   . Hypothyroidism   .  Jaundice   . Pancreatitis 2009  . Reflux esophagitis     Past Surgical History:  Procedure Laterality Date  . BREAST SURGERY    . CESAREAN SECTION    . CESAREAN SECTION WITH BILATERAL TUBAL LIGATION    . CHOLECYSTECTOMY    . EXPLORATORY LAPAROTOMY    . MASTECTOMY Right   . THYROIDECTOMY    . TONSILLECTOMY      Family Psychiatric History: See intake H&P for full details. Reviewed, with no updates at this time.   Family History:  Family History  Problem Relation Age of Onset  . Arthritis Mother   . Mental illness Mother   . Depression Mother   . Alcohol abuse Father   . Hyperlipidemia Father   . Heart disease Father   . Stroke Father   . Hypertension Father   . Hypertension Sister   . Mental illness Sister   . Mental illness Brother   . Depression Brother   . Cancer Paternal Grandfather     prostate    Social History:  Social History   Social History  . Marital status: Divorced    Spouse name: N/A  . Number of children: N/A  . Years of education: N/A   Social History Main Topics  . Smoking status: Never Smoker  . Smokeless tobacco: Never Used  . Alcohol use No  . Drug use: No  . Sexual activity: No   Other Topics Concern  .  None   Social History Narrative  . None    Allergies:  Allergies  Allergen Reactions  . Codeine Nausea And Vomiting  . Gabapentin Nausea And Vomiting  . Sulfa Antibiotics Diarrhea    Metabolic Disorder Labs: No results found for: HGBA1C, MPG No results found for: PROLACTIN No results found for: CHOL, TRIG, HDL, CHOLHDL, VLDL, LDLCALC   Current Medications: Current Outpatient Prescriptions  Medication Sig Dispense Refill  . BuPROPion HCl ER, XL, 450 MG TB24 Take 450 mg by mouth daily. 90 tablet 0  . citalopram (CELEXA) 40 MG tablet Take 1 tablet (40 mg total) by mouth daily. 90 tablet 0  . levothyroxine (SYNTHROID, LEVOTHROID) 75 MCG tablet Take 1 tablet (75 mcg total) by mouth daily before breakfast. 90 tablet 3  .  omeprazole (PRILOSEC) 20 MG capsule Take 1 capsule (20 mg total) by mouth 2 (two) times daily. 180 capsule 3  . vitamin B-12 (CYANOCOBALAMIN) 100 MCG tablet Take 100 mcg by mouth daily.     No current facility-administered medications for this visit.     Neurologic: Headache: Negative Seizure: Negative Paresthesias: Negative  Musculoskeletal: Strength & Muscle Tone: within normal limits Gait & Station: normal Patient leans: N/A  Psychiatric Specialty Exam: Review of Systems  All other systems reviewed and are negative.   Blood pressure 120/70, pulse 81, height 5\' 4"  (1.626 m), weight 146 lb 12.8 oz (66.6 kg).Body mass index is 25.2 kg/m.  General Appearance: Casual and Fairly Groomed  Eye Contact:  Good  Speech:  Clear and Coherent  Volume:  Normal  Mood:  Euthymic  Affect:  Congruent  Thought Process:  Goal Directed  Orientation:  Full (Time, Place, and Person)  Thought Content: Logical   Suicidal Thoughts:  No  Homicidal Thoughts:  No  Memory:  Immediate;   Good  Judgement:  Intact  Insight:  Fair  Psychomotor Activity:  Normal  Concentration:  Concentration: Fair  Recall:  Good  Fund of Knowledge: Good  Language: Good  Akathisia:  Negative  Handed:  Right  AIMS (if indicated):  na  Assets:  Communication Skills Desire for Improvement Financial Resources/Insurance Housing Leisure Time Physical Health Resilience Social Support Talents/Skills Transportation Vocational/Educational  ADL's:  Intact  Cognition: WNL  Sleep:  8-10 hours   Treatment Plan Summary: Joniyah Mallinger is a 71 year old female with a psychiatric history of major depressive disorder. She presents today for transfer of care, as her provider moved locations. I spent time with the patient learning about her past psychiatric history, and her social history. She is currently doing employed, and has a strong education background. She has had significant improvement in her depressive symptoms, but  has some lingering fatigue and anhedonia. Will titrate as below and follow up in 8 weeks.  1. Major depressive disorder, recurrent episode, moderate ([place])   2. Attention and concentration deficit    Increase Wellbutrin to 450 mg daily Continue Celexa 40 mg daily Follow-up with writer in 2 months Referral placed for PHD psychologist for therapy per patient request Patient is willing to consider IOP program as her work schedule becomes more clear  Aundra Dubin, MD 08/10/2016, 12:36 PM

## 2016-08-10 NOTE — Addendum Note (Signed)
Addended by: Lulu Riding A on: 08/10/2016 12:54 PM   Modules accepted: Orders

## 2016-08-13 ENCOUNTER — Other Ambulatory Visit (HOSPITAL_COMMUNITY): Payer: Self-pay | Admitting: Psychiatry

## 2016-08-13 DIAGNOSIS — F331 Major depressive disorder, recurrent, moderate: Secondary | ICD-10-CM

## 2016-08-13 MED ORDER — BUPROPION HCL ER (XL) 150 MG PO TB24: 150.0000 mg | ORAL_TABLET | ORAL | 3 refills | Status: DC

## 2016-08-13 MED ORDER — BUPROPION HCL ER (XL) 300 MG PO TB24
300.0000 mg | ORAL_TABLET | ORAL | 3 refills | Status: DC
Start: 2016-08-13 — End: 2016-12-24

## 2016-09-06 ENCOUNTER — Other Ambulatory Visit: Payer: Self-pay | Admitting: Oncology

## 2016-09-06 ENCOUNTER — Ambulatory Visit (HOSPITAL_BASED_OUTPATIENT_CLINIC_OR_DEPARTMENT_OTHER): Payer: Medicare Other | Admitting: Oncology

## 2016-09-06 VITALS — BP 114/56 | HR 86 | Temp 98.4°F | Resp 18 | Ht 64.0 in | Wt 146.3 lb

## 2016-09-06 DIAGNOSIS — C50411 Malignant neoplasm of upper-outer quadrant of right female breast: Secondary | ICD-10-CM

## 2016-09-06 DIAGNOSIS — Z171 Estrogen receptor negative status [ER-]: Secondary | ICD-10-CM

## 2016-09-06 NOTE — Progress Notes (Signed)
[place]  [phone] [phone]     ID: Robin Valencia DOB: 21-Sep-1945  MR#: [id number]  KWI#:[id number]  Patient Care Team: Hoyt Koch, MD as PCP - General (Internal Medicine) Chauncey Cruel, MD as Consulting Physician (Oncology) PCP: Hoyt Koch, MD GYN: SU:  OTHER MD: Loyal Jacobson MD  CHIEF COMPLAINT: Triple negative breast cancer  CURRENT TREATMENT: Observation   BREAST CANCER HISTORY: From the original intake note:  Robin Valencia tells me she palpated a change in her right breast January 2015. It was "very high, almost outside the breast". She contacted her primary care physician, underwent mammography and ultrasonography, (I do not have those records) and on 06/03/2013 underwent biopsy of a mass described as "T2 NX) in the upper outer quadrant of the right breast. The pathology from that procedure [facility] [id number]) showed an invasive ductal carcinoma, grade 3, triple negative, with an MIB-1 of 91%.  The patient was treated neoadjuvantly with cyclophosphamide and doxorubicin between 07/23/2013 and 09/30/2013. There were no dose reductions, but there were delays between the second and third cycle (4 weeks (and the third and fourth (3 weeks). This was followed by paclitaxel weekly 12, with no treatment delays, but dose reduction of 20% with the final 4 doses. This was completed 01/14/2014.  On 02/19/2014 the patient underwent right mastectomy and sentinel lymph node sampling, with the final pathology ([id number]) showing a complete pathologic response in the breast (no invasive disease and no DCIS) and all 4 sentinel lymph nodes clear. The patient then proceeded to adjuvant radiation. I do not have the radiation records.  Her subsequent history is as detailed below  INTERVAL HISTORY: Robin Valencia returns today for follow-up of her estrogen receptor negative breast cancer. Interval history is significant for her having had a granddaughter.  Her son is still in [place] and the baby has already been in multiple countries but they will be settling in the [place] area in June and then he will be in the states for 2 years. She is very much looking forward to this.  REVIEW OF SYSTEMS: Robin Valencia is emotionally much more stable on her current medications. She feels generally well, and denies any significant anxiety depression, or other affect issues. She exercises regularly by walking. A detailed review of systems today was otherwise stable  PAST MEDICAL HISTORY: Past Medical History:  Diagnosis Date  . Arthritis   . Breast cancer of upper-outer quadrant of right female breast ([place])   . Depression   . Eating disorder   . History of stomach ulcers   . Hypothyroidism   . Jaundice   . Pancreatitis 2009  . Reflux esophagitis     PAST SURGICAL HISTORY: Past Surgical History:  Procedure Laterality Date  . BREAST SURGERY    . CESAREAN SECTION    . CESAREAN SECTION WITH BILATERAL TUBAL LIGATION    . CHOLECYSTECTOMY    . EXPLORATORY LAPAROTOMY    . MASTECTOMY Right   . THYROIDECTOMY    . TONSILLECTOMY      FAMILY HISTORY Family History  Problem Relation Age of Onset  . Arthritis Mother   . Mental illness Mother   . Depression Mother   . Alcohol abuse Father   . Hyperlipidemia Father   . Heart disease Father   . Stroke Father   . Hypertension Father   . Hypertension Sister   . Mental illness Sister   . Mental illness Brother   . Depression Brother   . Cancer Paternal  Grandfather     prostate   The patient's father died from a stroke at the age of 13. The patient's mother died at the age of 95. The patient had one brother, 2 sisters. One sister was diagnosed with breast cancer in her 60s. One maternal aunt was diagnosed with breast cancer in her 97s. There is no history of ovarian cancer in the family.  GYNECOLOGIC HISTORY:  No LMP recorded. Patient is postmenopausal. Menarche age 55, first live birth age 71, which the patient  understands increases the risk of breast cancer. She went through the change of life in 1989, and used hormone replacement for approximately 10 years, which also increases the risk of breast cancer developing. 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 She is currently looking for a paralegal position. She is divorced, and lives by herself with 2 [name]. Her son Robin Valencia is a foreign service operative currently in [place]. He recently was married. The patient has no grandchildren. She attends a [employer].    ADVANCED DIRECTIVES: In place. The patient's son is her healthcare part turning   HEALTH MAINTENANCE: Social History  Substance Use Topics  . Smoking status: Never Smoker  . Smokeless tobacco: Never Used  . Alcohol use No     Colonoscopy:  PAP:  Bone density:  Lipid panel:  Allergies  Allergen Reactions  . Codeine Nausea And Vomiting  . Gabapentin Nausea And Vomiting  . Sulfa Antibiotics Diarrhea    Current Outpatient Prescriptions  Medication Sig Dispense Refill  . buPROPion (WELLBUTRIN XL) 150 MG 24 hr tablet Take 1 tablet (150 mg total) by mouth every morning. Take with 300 for a total of 450 mg daily 90 tablet 3  . buPROPion (WELLBUTRIN XL) 300 MG 24 hr tablet Take 1 tablet (300 mg total) by mouth every morning. Take with 150 mg for a total of 450 mg daily 90 tablet 3  . citalopram (CELEXA) 40 MG tablet Take 1 tablet (40 mg total) by mouth daily. 90 tablet 0  . levothyroxine (SYNTHROID, LEVOTHROID) 75 MCG tablet Take 1 tablet (75 mcg total) by mouth daily before breakfast. 90 tablet 3  . omeprazole (PRILOSEC) 20 MG capsule Take 1 capsule (20 mg total) by mouth 2 (two) times daily. 180 capsule 3  . vitamin B-12 (CYANOCOBALAMIN) 100 MCG tablet Take 100 mcg by mouth daily.     No current facility-administered medications for this visit.      OBJECTIVE: Middle-aged white woman who appears stated age   71:   09/06/16 0931  BP: (!) 114/56  Pulse: 86  Resp: 18  Temp: 98.4 F (36.9 C)     Body mass index is 25.11 kg/m.    ECOG FS:1 - Symptomatic but completely ambulatory  Sclerae unicteric, EOMs intact Oropharynx clear and moist No cervical or supraclavicular adenopathy Lungs no rales or rhonchi Heart regular rate and rhythm Abd soft, nontender, positive bowel sounds MSK no focal spinal tenderness, no upper extremity lymphedema Neuro: nonfocal, well oriented, appropriate affect Breasts: The right breast has undergone lumpectomy and radiation with no evidence of local recurrence. The left breast is unremarkable. Both axillae are benign.  LAB RESULTS:  CMP     Component Value Date/Time   NA 141 10/25/2015 1448   NA 144 08/01/2015 1013   K 3.8 10/25/2015 1448   K 3.9 08/01/2015 1013   CL 106  10/25/2015 1448   CO2 26 10/25/2015 1448   CO2 25 08/01/2015 1013   GLUCOSE 112 (H) 10/25/2015 1448   GLUCOSE 95 08/01/2015 1013   BUN 18 10/25/2015 1448   BUN 21.6 08/01/2015 1013   CREATININE 0.86 10/25/2015 1448   CREATININE 0.8 08/01/2015 1013   CALCIUM 9.5 10/25/2015 1448   CALCIUM 8.6 08/01/2015 1013   PROT 6.9 10/25/2015 1448   PROT 6.3 (L) 08/01/2015 1013   ALBUMIN 4.3 10/25/2015 1448   ALBUMIN 3.6 08/01/2015 1013   AST 24 10/25/2015 1448   AST 25 08/01/2015 1013   ALT 25 10/25/2015 1448   ALT 23 08/01/2015 1013   ALKPHOS 104 10/25/2015 1448   ALKPHOS 96 08/01/2015 1013   BILITOT 0.8 10/25/2015 1448   BILITOT 0.67 08/01/2015 1013   GFRNONAA >60 05/04/2015 1811   GFRAA >60 05/04/2015 1811    INo results found for: SPEP, UPEP  Lab Results  Component Value Date   WBC 5.1 08/01/2015   NEUTROABS 3.2 08/01/2015   HGB 13.4 08/01/2015   HCT 39.8 08/01/2015   MCV 93.0 08/01/2015   PLT 185 08/01/2015      Chemistry      Component Value Date/Time   NA 141 10/25/2015 1448   NA 144 08/01/2015  1013   K 3.8 10/25/2015 1448   K 3.9 08/01/2015 1013   CL 106 10/25/2015 1448   CO2 26 10/25/2015 1448   CO2 25 08/01/2015 1013   BUN 18 10/25/2015 1448   BUN 21.6 08/01/2015 1013   CREATININE 0.86 10/25/2015 1448   CREATININE 0.8 08/01/2015 1013      Component Value Date/Time   CALCIUM 9.5 10/25/2015 1448   CALCIUM 8.6 08/01/2015 1013   ALKPHOS 104 10/25/2015 1448   ALKPHOS 96 08/01/2015 1013   AST 24 10/25/2015 1448   AST 25 08/01/2015 1013   ALT 25 10/25/2015 1448   ALT 23 08/01/2015 1013   BILITOT 0.8 10/25/2015 1448   BILITOT 0.67 08/01/2015 1013       Lab Results  Component Value Date   LABCA2 30 04/07/2015    No components found for: [id number]  No results for input(s): INR in the last 168 hours.  Urinalysis    Component Value Date/Time   COLORURINE YELLOW 05/05/2015 0154   APPEARANCEUR CLOUDY (A) 05/05/2015 0154   LABSPEC 1.023 05/05/2015 0154   PHURINE 6.0 05/05/2015 0154   GLUCOSEU NEGATIVE 05/05/2015 0154   HGBUR TRACE (A) 05/05/2015 0154   BILIRUBINUR NEGATIVE 05/05/2015 0154   KETONESUR 15 (A) 05/05/2015 0154   PROTEINUR NEGATIVE 05/05/2015 0154   NITRITE NEGATIVE 05/05/2015 0154   LEUKOCYTESUR LARGE (A) 05/05/2015 0154    STUDIES: She will be due for repeat mammography late June 2018. Most recent mammogram showed the breast density to be category B.  ASSESSMENT: 71 y.o. [place], [place] woman status post right breast upper outer quadrant biopsy 06/04/2013 for a clinical T2 N0, stage IIA invasive ductal carcinoma, triple negative, with an MIB-1 of 91%  (1) neoadjuvant chemotherapy consisted of doxorubicin and cyclophosphamide 4 followed by weekly Taxol 12  (a) some treatment delays with doxorubicin/cyclophosphamide cycles  (b) 20% dose reduction final for Taxol cycles  (2) status post right lumpectomy and sentinel lymph node sampling 02/19/2014 showing a complete pathologic response (ypT0, ypN0)  (3) status post adjuvant radiation  (4) genetics  counseling considered--low risk for family mutation to be present  PLAN:  Mckinnley is now 1-1/2 years out from definitive surgery for her breast  cancer with no evidence of disease recurrence. This is favorable.  She will be due for repeat mammography late June. I have gone ahead and entered those orders for her.  In October she will hit her two-year anniversary. I will see her late October or early November and I will start seeing her on a yearly basis thereafter.  In the meantime I gave her information on the trails to recovery program, the [place] program, and the yoga program here so she can increase her exercise routine.  She knows to call for any problems that may develop before her next visit Chauncey Cruel, MD   09/06/2016 1:48 PM Medical Oncology and Hematology [facility] [place], [place] [address] Tel. [phone]    Fax. [phone] This discussed Magrinat okay

## 2016-10-04 ENCOUNTER — Ambulatory Visit (HOSPITAL_COMMUNITY): Payer: Self-pay | Admitting: Psychiatry

## 2016-10-09 ENCOUNTER — Ambulatory Visit: Payer: Medicare Other | Admitting: Psychology

## 2016-10-12 ENCOUNTER — Encounter: Payer: Self-pay | Admitting: Internal Medicine

## 2016-10-12 ENCOUNTER — Ambulatory Visit: Payer: Medicare Other | Admitting: Internal Medicine

## 2016-10-16 ENCOUNTER — Ambulatory Visit: Payer: Medicare Other | Admitting: Nurse Practitioner

## 2016-11-06 ENCOUNTER — Ambulatory Visit (INDEPENDENT_AMBULATORY_CARE_PROVIDER_SITE_OTHER): Payer: Medicare Other | Admitting: Psychology

## 2016-11-06 DIAGNOSIS — F331 Major depressive disorder, recurrent, moderate: Secondary | ICD-10-CM | POA: Diagnosis not present

## 2016-11-08 ENCOUNTER — Encounter (HOSPITAL_COMMUNITY): Payer: Self-pay | Admitting: Psychology

## 2016-11-08 NOTE — Progress Notes (Signed)
Robin Valencia is a 71 y.o. female patient as pt didn't return after initial assessment for counseling.  Pt requested referral for PhD therapist at 08/10/16 appointment w/ Dr. Daron Offer. Outpatient Therapist Discharge Summary  Robin Valencia    [birth year]   Admission Date: 04/16/16   Discharge Date:  11/08/16 Reason for Discharge:  Didn't return Diagnosis:  MDD Comments:  Pt requested referral for another provider  Robin Valencia, [place]

## 2016-11-19 ENCOUNTER — Other Ambulatory Visit (INDEPENDENT_AMBULATORY_CARE_PROVIDER_SITE_OTHER): Payer: Medicare Other

## 2016-11-19 ENCOUNTER — Ambulatory Visit (INDEPENDENT_AMBULATORY_CARE_PROVIDER_SITE_OTHER): Payer: Medicare Other | Admitting: Nurse Practitioner

## 2016-11-19 VITALS — BP 116/70 | HR 87 | Temp 98.6°F | Ht 64.0 in | Wt 141.0 lb

## 2016-11-19 DIAGNOSIS — E890 Postprocedural hypothyroidism: Secondary | ICD-10-CM | POA: Diagnosis not present

## 2016-11-19 DIAGNOSIS — N760 Acute vaginitis: Secondary | ICD-10-CM

## 2016-11-19 DIAGNOSIS — E538 Deficiency of other specified B group vitamins: Secondary | ICD-10-CM

## 2016-11-19 DIAGNOSIS — H6983 Other specified disorders of Eustachian tube, bilateral: Secondary | ICD-10-CM | POA: Diagnosis not present

## 2016-11-19 DIAGNOSIS — R300 Dysuria: Secondary | ICD-10-CM

## 2016-11-19 LAB — POCT URINALYSIS DIPSTICK
Bilirubin, UA: NEGATIVE
GLUCOSE UA: NEGATIVE
KETONES UA: NEGATIVE
NITRITE UA: NEGATIVE
PH UA: 6 (ref 5.0–8.0)
PROTEIN UA: NEGATIVE
RBC UA: NEGATIVE
Spec Grav, UA: 1.025 (ref 1.010–1.025)
Urobilinogen, UA: 0.2 E.U./dL

## 2016-11-19 LAB — VITAMIN B12: Vitamin B-12: >1500 pg/mL — ABNORMAL HIGH (ref 211–911)

## 2016-11-19 LAB — TSH: TSH: 0.5 u[IU]/mL (ref 0.35–4.50)

## 2016-11-19 LAB — T4, FREE: Free T4: 0.79 ng/dL (ref 0.60–1.60)

## 2016-11-19 MED ORDER — FLUTICASONE PROPIONATE 50 MCG/ACT NA SUSP: 2.0000 | Freq: Every day | NASAL | 0 refills | Status: DC

## 2016-11-19 MED ORDER — FLUCONAZOLE 150 MG PO TABS: 150.0000 mg | ORAL_TABLET | Freq: Once | ORAL | 0 refills | Status: AC

## 2016-11-19 MED ORDER — LORATADINE 10 MG PO TABS: 10.0000 mg | ORAL_TABLET | Freq: Every day | ORAL | 0 refills | Status: DC

## 2016-11-19 MED ORDER — LEVOTHYROXINE SODIUM 50 MCG PO TABS: 50.0000 ug | ORAL_TABLET | Freq: Every day | ORAL | 1 refills | Status: DC

## 2016-11-19 MED ORDER — METRONIDAZOLE 0.75 % VA GEL: 1.0000 | Freq: Two times a day (BID) | VAGINAL | 0 refills | Status: DC

## 2016-11-19 NOTE — Progress Notes (Signed)
Subjective:  Patient ID: Robin Valencia, female    DOB: 04/13/1946  Age: 71 y.o. MRN: [id number]  CC: Ear Fullness (ear full, feels like its open and close/B12 level check/ itching,buring,odor when urinate/ citalapram refill?)   Otalgia   There is pain in both ears. This is a new problem. The current episode started in the past 7 days. The problem has been waxing and waning. There has been no fever. Pertinent negatives include no abdominal pain, coughing, diarrhea, ear discharge, headaches, hearing loss, neck pain, rash, rhinorrhea, sore throat or vomiting. She has tried nothing for the symptoms. There is no history of a chronic ear infection, hearing loss or a tympanostomy tube.  Vaginal Itching  The patient's primary symptoms include genital itching, a genital odor and vaginal discharge. The patient's pertinent negatives include no genital lesions, genital rash, pelvic pain or vaginal bleeding. This is a new problem. The current episode started in the past 7 days. The problem occurs constantly. The problem has been unchanged. The patient is experiencing no pain. The problem affects both sides. She is not pregnant. Pertinent negatives include no abdominal pain, back pain, chills, constipation, diarrhea, discolored urine, dysuria, fever, flank pain, frequency, headaches, hematuria, painful intercourse, rash, sore throat, urgency or vomiting. The vaginal discharge was thick and white. There has been no bleeding. Nothing aggravates the symptoms. She has tried nothing for the symptoms. She is not sexually active. She is postmenopausal. Her past medical history is significant for vaginosis. There is no history of herpes simplex.    Outpatient Medications Prior to Visit  Medication Sig Dispense Refill  . buPROPion (WELLBUTRIN XL) 150 MG 24 hr tablet Take 1 tablet (150 mg total) by mouth every morning. Take with 300 for a total of 450 mg daily 90 tablet 3  . buPROPion (WELLBUTRIN XL) 300 MG 24 hr tablet  Take 1 tablet (300 mg total) by mouth every morning. Take with 150 mg for a total of 450 mg daily 90 tablet 3  . citalopram (CELEXA) 40 MG tablet Take 1 tablet (40 mg total) by mouth daily. 90 tablet 0  . omeprazole (PRILOSEC) 20 MG capsule Take 1 capsule (20 mg total) by mouth 2 (two) times daily. 180 capsule 3  . vitamin B-12 (CYANOCOBALAMIN) 100 MCG tablet Take 100 mcg by mouth daily.    Marland Kitchen levothyroxine (SYNTHROID, LEVOTHROID) 75 MCG tablet Take 1 tablet (75 mcg total) by mouth daily before breakfast. 90 tablet 3   No facility-administered medications prior to visit.     ROS See HPI  Objective:  BP 116/70   Pulse 87   Temp 98.6 F (37 C)   Ht 5\' 4"  (1.626 m)   Wt 141 lb (64 kg)   SpO2 98%   BMI 24.20 kg/m   BP Readings from Last 3 Encounters:  11/19/16 116/70  09/06/16 (!) 114/56  10/25/15 120/78    Wt Readings from Last 3 Encounters:  11/19/16 141 lb (64 kg)  09/06/16 146 lb 4.8 oz (66.4 kg)  10/25/15 144 lb (65.3 kg)    Physical Exam  Constitutional: She is oriented to person, place, and time.  HENT:  Right Ear: External ear and ear canal normal. Tympanic membrane is not injected and not erythematous. A middle ear effusion is present.  Left Ear: External ear and ear canal normal. Tympanic membrane is not injected and not erythematous. A middle ear effusion is present.  Nose: Nose normal.  Mouth/Throat: Uvula is midline and oropharynx is clear and  moist. No oropharyngeal exudate or posterior oropharyngeal erythema.  Neck: Normal range of motion. Neck supple. No thyromegaly present.  Cardiovascular: Normal rate and regular rhythm.   Pulmonary/Chest: Effort normal and breath sounds normal.  Abdominal: Soft. She exhibits no distension. There is no tenderness.  Genitourinary:  Genitourinary Comments: Patient declined pelvic exam  Lymphadenopathy:    She has no cervical adenopathy.  Neurological: She is alert and oriented to person, place, and time.  Skin: Skin is  warm and dry.  Vitals reviewed.   Lab Results  Component Value Date   WBC 5.1 08/01/2015   HGB 13.4 08/01/2015   HCT 39.8 08/01/2015   PLT 185 08/01/2015   GLUCOSE 112 (H) 10/25/2015   ALT 25 10/25/2015   AST 24 10/25/2015   NA 141 10/25/2015   K 3.8 10/25/2015   CL 106 10/25/2015   CREATININE 0.86 10/25/2015   BUN 18 10/25/2015   CO2 26 10/25/2015   TSH 0.50 11/19/2016    Mm Diag Breast W/implant Tomo Bilateral  Result Date: 10/28/2015 CLINICAL DATA:  History of treated right breast cancer. EXAM: 2D DIGITAL DIAGNOSTIC BILATERAL MAMMOGRAM WITH IMPLANTS, CAD AND ADJUNCT TOMO The patient has retroglandular implants. Standard and implant displaced views were performed. COMPARISON:  Previous exam(s). ACR Breast Density Category b: There are scattered areas of fibroglandular density. FINDINGS: There are no suspicious masses, areas of nonsurgical architectural distortion or microcalcifications in either breast. Postsurgical changes in the right breast, and bilateral breast subglandular silicone implants are stable. There is evidence of extracapsular implant rupture in both breasts, stable. Mammographic images were processed with CAD. IMPRESSION: No mammographic evidence of malignancy in either breast. Bilateral subglandular silicone implants with evidence of extracapsular rupture, stable. RECOMMENDATION: Diagnostic mammogram is suggested in 1 year. (Code:DM-B-01Y) I have discussed the findings and recommendations with the patient. Results were also provided in writing at the conclusion of the visit. If applicable, a reminder letter will be sent to the patient regarding the next appointment. BI-RADS CATEGORY  2: Benign. Electronically Signed   By: Fidela Salisbury M.D.   On: 10/28/2015 15:25    Assessment & Plan:   Robin Valencia was seen today for ear fullness.  Diagnoses and all orders for this visit:  Dysuria -     POCT urinalysis dipstick -     fluconazole (DIFLUCAN) 150 MG tablet; Take 1  tablet (150 mg total) by mouth once. -     metroNIDAZOLE (METROGEL) 0.75 % vaginal gel; Place 1 Applicatorful vaginally 2 (two) times daily.  Hypothyroidism, postsurgical -     TSH; Future -     T4, free; Future -     levothyroxine (SYNTHROID, LEVOTHROID) 50 MCG tablet; Take 1 tablet (50 mcg total) by mouth daily before breakfast.  Vaginosis -     fluconazole (DIFLUCAN) 150 MG tablet; Take 1 tablet (150 mg total) by mouth once. -     metroNIDAZOLE (METROGEL) 0.75 % vaginal gel; Place 1 Applicatorful vaginally 2 (two) times daily.  Vitamin B12 deficiency -     B12; Future  Eustachian tube dysfunction, bilateral -     fluticasone (FLONASE) 50 MCG/ACT nasal spray; Place 2 sprays into both nostrils daily. -     loratadine (CLARITIN) 10 MG tablet; Take 1 tablet (10 mg total) by mouth daily.   I have changed Ms. Nishi's levothyroxine. I am also having her start on fluconazole, metroNIDAZOLE, fluticasone, and loratadine. Additionally, I am having her maintain her omeprazole, vitamin B-12, citalopram, buPROPion, and buPROPion.  Meds ordered this encounter  Medications  . fluconazole (DIFLUCAN) 150 MG tablet    Sig: Take 1 tablet (150 mg total) by mouth once.    Dispense:  1 tablet    Refill:  0    Order Specific Question:   Supervising Provider    Answer:   Cassandria Anger [[id number]]  . metroNIDAZOLE (METROGEL) 0.75 % vaginal gel    Sig: Place 1 Applicatorful vaginally 2 (two) times daily.    Dispense:  70 g    Refill:  0    Order Specific Question:   Supervising Provider    Answer:   Cassandria Anger [[id number]]  . fluticasone (FLONASE) 50 MCG/ACT nasal spray    Sig: Place 2 sprays into both nostrils daily.    Dispense:  16 g    Refill:  0    Order Specific Question:   Supervising Provider    Answer:   Cassandria Anger [[id number]]  . loratadine (CLARITIN) 10 MG tablet    Sig: Take 1 tablet (10 mg total) by mouth daily.    Dispense:  30 tablet    Refill:  0    Order Specific  Question:   Supervising Provider    Answer:   Cassandria Anger [[id number]]  . levothyroxine (SYNTHROID, LEVOTHROID) 50 MCG tablet    Sig: Take 1 tablet (50 mcg total) by mouth daily before breakfast.    Dispense:  90 tablet    Refill:  1    Order Specific Question:   Supervising Provider    Answer:   Cassandria Anger [[id number]]    Follow-up: Return in about 6 months (around 05/22/2017) for with Dr. Sharlet Salina.  Wilfred Lacy, NP

## 2016-11-19 NOTE — Patient Instructions (Signed)
Please call psychiatry for citalopram refill.  Go to basement for blood draw  You will be called with results.

## 2016-11-21 ENCOUNTER — Encounter: Payer: Self-pay | Admitting: Nurse Practitioner

## 2016-11-23 ENCOUNTER — Ambulatory Visit (INDEPENDENT_AMBULATORY_CARE_PROVIDER_SITE_OTHER): Payer: Medicare Other | Admitting: Psychology

## 2016-11-23 DIAGNOSIS — F331 Major depressive disorder, recurrent, moderate: Secondary | ICD-10-CM

## 2016-12-04 ENCOUNTER — Ambulatory Visit (HOSPITAL_COMMUNITY): Payer: Medicare Other | Admitting: Psychiatry

## 2016-12-06 ENCOUNTER — Ambulatory Visit: Payer: Self-pay | Admitting: Psychology

## 2016-12-12 ENCOUNTER — Ambulatory Visit (INDEPENDENT_AMBULATORY_CARE_PROVIDER_SITE_OTHER): Payer: Medicare Other | Admitting: Psychology

## 2016-12-12 DIAGNOSIS — F331 Major depressive disorder, recurrent, moderate: Secondary | ICD-10-CM

## 2016-12-12 DIAGNOSIS — F411 Generalized anxiety disorder: Secondary | ICD-10-CM

## 2016-12-20 ENCOUNTER — Ambulatory Visit (INDEPENDENT_AMBULATORY_CARE_PROVIDER_SITE_OTHER): Payer: Medicare Other | Admitting: Psychology

## 2016-12-20 DIAGNOSIS — F331 Major depressive disorder, recurrent, moderate: Secondary | ICD-10-CM | POA: Diagnosis not present

## 2016-12-21 ENCOUNTER — Other Ambulatory Visit (HOSPITAL_COMMUNITY): Payer: Self-pay

## 2016-12-24 ENCOUNTER — Other Ambulatory Visit (HOSPITAL_COMMUNITY): Payer: Self-pay

## 2016-12-24 DIAGNOSIS — F331 Major depressive disorder, recurrent, moderate: Secondary | ICD-10-CM

## 2016-12-24 MED ORDER — BUPROPION HCL ER (XL) 300 MG PO TB24: 300.0000 mg | ORAL_TABLET | ORAL | 3 refills | Status: DC

## 2016-12-24 MED ORDER — CITALOPRAM HYDROBROMIDE 40 MG PO TABS: 40.0000 mg | ORAL_TABLET | Freq: Every day | ORAL | 0 refills | Status: DC

## 2016-12-25 ENCOUNTER — Other Ambulatory Visit (HOSPITAL_COMMUNITY): Payer: Self-pay

## 2016-12-25 ENCOUNTER — Ambulatory Visit: Payer: Medicare Other | Admitting: Psychology

## 2016-12-25 DIAGNOSIS — F331 Major depressive disorder, recurrent, moderate: Secondary | ICD-10-CM

## 2016-12-25 MED ORDER — CITALOPRAM HYDROBROMIDE 40 MG PO TABS: 40.0000 mg | ORAL_TABLET | Freq: Every day | ORAL | 0 refills | Status: DC

## 2016-12-25 MED ORDER — BUPROPION HCL ER (XL) 300 MG PO TB24: 300.0000 mg | ORAL_TABLET | ORAL | 0 refills | Status: DC

## 2016-12-27 ENCOUNTER — Ambulatory Visit (INDEPENDENT_AMBULATORY_CARE_PROVIDER_SITE_OTHER): Payer: Medicare Other | Admitting: Psychiatry

## 2016-12-27 DIAGNOSIS — Z818 Family history of other mental and behavioral disorders: Secondary | ICD-10-CM

## 2016-12-27 DIAGNOSIS — Z811 Family history of alcohol abuse and dependence: Secondary | ICD-10-CM | POA: Diagnosis not present

## 2016-12-27 DIAGNOSIS — F331 Major depressive disorder, recurrent, moderate: Secondary | ICD-10-CM | POA: Diagnosis not present

## 2016-12-27 MED ORDER — BUPROPION HCL ER (XL) 150 MG PO TB24: 150.0000 mg | ORAL_TABLET | ORAL | 3 refills | Status: DC

## 2016-12-27 MED ORDER — CITALOPRAM HYDROBROMIDE 40 MG PO TABS: 40.0000 mg | ORAL_TABLET | Freq: Every day | ORAL | 3 refills | Status: DC

## 2016-12-27 MED ORDER — BUPROPION HCL ER (XL) 300 MG PO TB24: 300.0000 mg | ORAL_TABLET | ORAL | 3 refills | Status: DC

## 2016-12-27 NOTE — Progress Notes (Signed)
[facility] MD/PA/NP OP Progress Note  12/27/2016 10:37 AM Robin Valencia  MRN:  [id number]  Chief Complaint: med check  HPI: Robin Valencia ran out of her medications about 2 weeks ago, and missed her last follow-up appointment. She presents today 23 minutes late. She reports that she is having some withdrawal symptoms and is electric sensations from being off the Celexa. She reports that when she was on Celexa and Wellbutrin consistently up until 2 weeks ago she was doing excellent. She reports that she continues to work with Dr. Cheryln Manly on her coping strategies. She reports that she was very happy with the medication regimen as prescribed and would like to continue that. Spent time with the patient discussing potential transfer to her PCP for refills in the future, but we can check in in 6 months to make sure she is remain stable. I will send a prescription renewals over to her pharmacy today. She denies any acute safety issues or other concerns. We'll follow-up in 6 months.  Visit Diagnosis:    ICD-10-CM   1. Major depressive disorder, recurrent episode, moderate (HCC) F33.1 citalopram (CELEXA) 40 MG tablet    buPROPion (WELLBUTRIN XL) 150 MG 24 hr tablet    buPROPion (WELLBUTRIN XL) 300 MG 24 hr tablet    Past Psychiatric History: See intake H&P for full details. Reviewed, with no updates at this time.   Past Medical History:  Past Medical History:  Diagnosis Date  . Arthritis   . Breast cancer of upper-outer quadrant of right female breast ([place])   . Depression   . Eating disorder   . History of stomach ulcers   . Hypothyroidism   . Jaundice   . Pancreatitis 2009  . Reflux esophagitis     Past Surgical History:  Procedure Laterality Date  . BREAST SURGERY    . CESAREAN SECTION    . CESAREAN SECTION WITH BILATERAL TUBAL LIGATION    . CHOLECYSTECTOMY    . EXPLORATORY LAPAROTOMY    . MASTECTOMY Right   . THYROIDECTOMY    . TONSILLECTOMY      Family Psychiatric History: See intake  H&P for full details. Reviewed, with no updates at this time.   Family History:  Family History  Problem Relation Age of Onset  . Arthritis Mother   . Mental illness Mother   . Depression Mother   . Alcohol abuse Father   . Hyperlipidemia Father   . Heart disease Father   . Stroke Father   . Hypertension Father   . Hypertension Sister   . Mental illness Sister   . Mental illness Brother   . Depression Brother   . Cancer Paternal Grandfather        prostate    Social History:  Social History   Social History  . Marital status: Divorced    Spouse name: N/A  . Number of children: N/A  . Years of education: N/A   Social History Main Topics  . Smoking status: Never Smoker  . Smokeless tobacco: Never Used  . Alcohol use No  . Drug use: No  . Sexual activity: No   Other Topics Concern  . Not on file   Social History Narrative  . No narrative on file    Allergies:  Allergies  Allergen Reactions  . Codeine Nausea And Vomiting  . Gabapentin Nausea And Vomiting  . Sulfa Antibiotics Diarrhea    Metabolic Disorder Labs: No results found for: HGBA1C, MPG No results found for: PROLACTIN No  results found for: CHOL, TRIG, HDL, CHOLHDL, VLDL, LDLCALC Lab Results  Component Value Date   TSH 0.50 11/19/2016   TSH 0.41 10/25/2015    Therapeutic Level Labs: No results found for: LITHIUM No results found for: VALPROATE No components found for:  CBMZ  Current Medications: Current Outpatient Prescriptions  Medication Sig Dispense Refill  . buPROPion (WELLBUTRIN XL) 150 MG 24 hr tablet Take 1 tablet (150 mg total) by mouth every morning. Take with 300 for a total of 450 mg daily 90 tablet 3  . buPROPion (WELLBUTRIN XL) 300 MG 24 hr tablet Take 1 tablet (300 mg total) by mouth every morning. Take with 150 mg for a total of 450 mg daily 90 tablet 3  . citalopram (CELEXA) 40 MG tablet Take 1 tablet (40 mg total) by mouth daily. 90 tablet 3  . fluticasone (FLONASE) 50  MCG/ACT nasal spray Place 2 sprays into both nostrils daily. 16 g 0  . levothyroxine (SYNTHROID, LEVOTHROID) 50 MCG tablet Take 1 tablet (50 mcg total) by mouth daily before breakfast. 90 tablet 1  . loratadine (CLARITIN) 10 MG tablet Take 1 tablet (10 mg total) by mouth daily. 30 tablet 0  . metroNIDAZOLE (METROGEL) 0.75 % vaginal gel Place 1 Applicatorful vaginally 2 (two) times daily. 70 g 0  . omeprazole (PRILOSEC) 20 MG capsule Take 1 capsule (20 mg total) by mouth 2 (two) times daily. 180 capsule 3  . vitamin B-12 (CYANOCOBALAMIN) 100 MCG tablet Take 100 mcg by mouth daily.     No current facility-administered medications for this visit.      Musculoskeletal: Strength & Muscle Tone: within normal limits Gait & Station: normal Patient leans: N/A  Psychiatric Specialty Exam: ROS  There were no vitals taken for this visit.There is no height or weight on file to calculate BMI.  General Appearance: Casual and Fairly Groomed  Eye Contact:  Good  Speech:  Clear and Coherent  Volume:  Normal  Mood:  Euthymic  Affect:  Appropriate and Congruent  Thought Process:  Goal Directed  Orientation:  Full (Time, Place, and Person)  Thought Content: Logical   Suicidal Thoughts:  No  Homicidal Thoughts:  No  Memory:  Immediate;   Good  Judgement:  Good  Insight:  Good  Psychomotor Activity:  Normal  Concentration:  Attention Span: Good  Recall:  Good  Fund of Knowledge: Good  Language: Good  Akathisia:  Negative  Handed:  Right  AIMS (if indicated): not done  Assets:  Communication Skills Desire for Improvement Financial Resources/Insurance Housing Social Support Transportation  ADL's:  Intact  Cognition: WNL  Sleep:  Good   Screenings: PHQ2-9     Office Visit from 10/25/2015 in [facility]  PHQ-2 Total Score  6  PHQ-9 Total Score  24      Assessment and Plan: Robin Valencia is a 71 year old female with a history of MDD, who presents today for med  management follow-up. She reports that her symptoms are in remission with the current regimen, and she will continue to follow-up with her therapist. She may ultimately transferred to primary care physician for medication management refills in the future. No acute safety issues at this time.  1. Major depressive disorder, recurrent episode, moderate (HCC)    Follow-up once more in 6 months Continue Wellbutrin 450 mg XL Continue Celexa 40 mg daily Continue in individual therapy  Aundra Dubin, MD 12/27/2016, 10:37 AM

## 2016-12-31 ENCOUNTER — Other Ambulatory Visit (HOSPITAL_COMMUNITY): Payer: Self-pay

## 2016-12-31 DIAGNOSIS — F331 Major depressive disorder, recurrent, moderate: Secondary | ICD-10-CM

## 2016-12-31 MED ORDER — BUPROPION HCL ER (XL) 150 MG PO TB24: 150.0000 mg | ORAL_TABLET | ORAL | 3 refills | Status: DC

## 2016-12-31 MED ORDER — BUPROPION HCL ER (XL) 300 MG PO TB24: 300.0000 mg | ORAL_TABLET | ORAL | 3 refills | Status: DC

## 2016-12-31 MED ORDER — CITALOPRAM HYDROBROMIDE 40 MG PO TABS: 40.0000 mg | ORAL_TABLET | Freq: Every day | ORAL | 3 refills | Status: DC

## 2017-01-01 ENCOUNTER — Ambulatory Visit (INDEPENDENT_AMBULATORY_CARE_PROVIDER_SITE_OTHER): Payer: Medicare Other | Admitting: Psychology

## 2017-01-01 DIAGNOSIS — F331 Major depressive disorder, recurrent, moderate: Secondary | ICD-10-CM | POA: Diagnosis not present

## 2017-01-21 ENCOUNTER — Ambulatory Visit: Payer: Medicare Other | Admitting: Psychology

## 2017-01-27 DIAGNOSIS — H60391 Other infective otitis externa, right ear: Secondary | ICD-10-CM | POA: Diagnosis not present

## 2017-01-27 DIAGNOSIS — J302 Other seasonal allergic rhinitis: Secondary | ICD-10-CM | POA: Diagnosis not present

## 2017-01-27 DIAGNOSIS — H6983 Other specified disorders of Eustachian tube, bilateral: Secondary | ICD-10-CM | POA: Diagnosis not present

## 2017-01-29 ENCOUNTER — Ambulatory Visit: Payer: Medicare Other | Admitting: Psychology

## 2017-02-04 DIAGNOSIS — M7502 Adhesive capsulitis of left shoulder: Secondary | ICD-10-CM | POA: Diagnosis not present

## 2017-02-06 ENCOUNTER — Ambulatory Visit (INDEPENDENT_AMBULATORY_CARE_PROVIDER_SITE_OTHER): Payer: Medicare Other | Admitting: Psychology

## 2017-02-06 DIAGNOSIS — F331 Major depressive disorder, recurrent, moderate: Secondary | ICD-10-CM

## 2017-02-14 ENCOUNTER — Ambulatory Visit (INDEPENDENT_AMBULATORY_CARE_PROVIDER_SITE_OTHER): Payer: Medicare Other | Admitting: Psychology

## 2017-02-14 DIAGNOSIS — F331 Major depressive disorder, recurrent, moderate: Secondary | ICD-10-CM

## 2017-02-15 ENCOUNTER — Ambulatory Visit: Payer: Medicare Other | Admitting: Psychology

## 2017-03-11 ENCOUNTER — Other Ambulatory Visit: Payer: Self-pay | Admitting: *Deleted

## 2017-03-11 DIAGNOSIS — Z171 Estrogen receptor negative status [ER-]: Principal | ICD-10-CM

## 2017-03-11 DIAGNOSIS — C50411 Malignant neoplasm of upper-outer quadrant of right female breast: Secondary | ICD-10-CM

## 2017-03-12 ENCOUNTER — Ambulatory Visit: Payer: Self-pay | Admitting: Oncology

## 2017-03-12 ENCOUNTER — Other Ambulatory Visit: Payer: Self-pay

## 2017-03-12 ENCOUNTER — Telehealth: Payer: Self-pay | Admitting: Oncology

## 2017-03-12 NOTE — Telephone Encounter (Signed)
Patient called to reschedule appointment. Appointment updated to 04/2017.

## 2017-03-18 ENCOUNTER — Ambulatory Visit (INDEPENDENT_AMBULATORY_CARE_PROVIDER_SITE_OTHER): Payer: Medicare Other | Admitting: Psychology

## 2017-03-18 DIAGNOSIS — F331 Major depressive disorder, recurrent, moderate: Secondary | ICD-10-CM | POA: Diagnosis not present

## 2017-04-18 ENCOUNTER — Ambulatory Visit
Admission: RE | Admit: 2017-04-18 | Discharge: 2017-04-18 | Disposition: A | Discharge status: Home / Self Care | Payer: Medicare Other | Source: Ambulatory Visit | Attending: Oncology | Admitting: Oncology

## 2017-04-18 DIAGNOSIS — R928 Other abnormal and inconclusive findings on diagnostic imaging of breast: Secondary | ICD-10-CM | POA: Diagnosis not present

## 2017-04-18 DIAGNOSIS — C50411 Malignant neoplasm of upper-outer quadrant of right female breast: Secondary | ICD-10-CM

## 2017-04-18 DIAGNOSIS — Z171 Estrogen receptor negative status [ER-]: Principal | ICD-10-CM

## 2017-04-18 HISTORY — DX: Personal history of antineoplastic chemotherapy: Z92.21

## 2017-04-18 HISTORY — DX: Personal history of irradiation: Z92.3

## 2017-04-19 ENCOUNTER — Ambulatory Visit: Payer: Self-pay | Admitting: Internal Medicine

## 2017-04-24 DIAGNOSIS — M19012 Primary osteoarthritis, left shoulder: Secondary | ICD-10-CM | POA: Diagnosis not present

## 2017-04-25 ENCOUNTER — Ambulatory Visit (INDEPENDENT_AMBULATORY_CARE_PROVIDER_SITE_OTHER): Payer: Medicare Other | Admitting: Psychology

## 2017-04-25 DIAGNOSIS — F331 Major depressive disorder, recurrent, moderate: Secondary | ICD-10-CM

## 2017-04-26 ENCOUNTER — Ambulatory Visit (INDEPENDENT_AMBULATORY_CARE_PROVIDER_SITE_OTHER): Payer: Medicare Other | Admitting: Internal Medicine

## 2017-04-26 ENCOUNTER — Encounter: Payer: Self-pay | Admitting: Internal Medicine

## 2017-04-26 ENCOUNTER — Telehealth: Payer: Self-pay

## 2017-04-26 ENCOUNTER — Other Ambulatory Visit (INDEPENDENT_AMBULATORY_CARE_PROVIDER_SITE_OTHER): Payer: Medicare Other

## 2017-04-26 VITALS — BP 120/70 | HR 80 | Temp 98.4°F | Ht 64.0 in | Wt 144.0 lb

## 2017-04-26 DIAGNOSIS — B379 Candidiasis, unspecified: Secondary | ICD-10-CM

## 2017-04-26 DIAGNOSIS — E890 Postprocedural hypothyroidism: Secondary | ICD-10-CM | POA: Diagnosis not present

## 2017-04-26 DIAGNOSIS — H938X3 Other specified disorders of ear, bilateral: Secondary | ICD-10-CM | POA: Diagnosis not present

## 2017-04-26 DIAGNOSIS — Z23 Encounter for immunization: Secondary | ICD-10-CM | POA: Diagnosis not present

## 2017-04-26 DIAGNOSIS — Z0001 Encounter for general adult medical examination with abnormal findings: Secondary | ICD-10-CM | POA: Diagnosis not present

## 2017-04-26 DIAGNOSIS — H938X9 Other specified disorders of ear, unspecified ear: Secondary | ICD-10-CM | POA: Insufficient documentation

## 2017-04-26 LAB — COMPREHENSIVE METABOLIC PANEL
ALBUMIN: 4.3 g/dL (ref 3.5–5.2)
ALT: 13 U/L (ref 0–35)
AST: 13 U/L (ref 0–37)
Alkaline Phosphatase: 94 U/L (ref 39–117)
BILIRUBIN TOTAL: 0.6 mg/dL (ref 0.2–1.2)
BUN: 22 mg/dL (ref 6–23)
CALCIUM: 8.9 mg/dL (ref 8.4–10.5)
CO2: 22 meq/L (ref 19–32)
CREATININE: 0.89 mg/dL (ref 0.40–1.20)
Chloride: 109 mEq/L (ref 96–112)
GFR: 66.68 mL/min (ref >60.00)
Glucose, Bld: 96 mg/dL (ref 70–99)
Potassium: 3.3 mEq/L — ABNORMAL LOW (ref 3.5–5.1)
Sodium: 141 mEq/L (ref 135–145)
TOTAL PROTEIN: 6.9 g/dL (ref 6.0–8.3)

## 2017-04-26 LAB — LIPID PANEL
CHOL/HDL RATIO: 3
Cholesterol: 195 mg/dL (ref 0–200)
HDL: 56.7 mg/dL (ref >39.00)
LDL Cholesterol: 113 mg/dL — ABNORMAL HIGH (ref 0–99)
NonHDL: 138.15
TRIGLYCERIDES: 125 mg/dL (ref 0.0–149.0)
VLDL: 25 mg/dL (ref 0.0–40.0)

## 2017-04-26 LAB — CBC
HCT: 43.8 % (ref 36.0–46.0)
Hemoglobin: 14.8 g/dL (ref 12.0–15.0)
MCHC: 33.9 g/dL (ref 30.0–36.0)
MCV: 95.1 fl (ref 78.0–100.0)
PLATELETS: 203 10*3/uL (ref 150.0–400.0)
RBC: 4.6 Mil/uL (ref 3.87–5.11)
RDW: 13.7 % (ref 11.5–15.5)
WBC: 6.8 10*3/uL (ref 4.0–10.5)

## 2017-04-26 LAB — TSH: TSH: 5.14 u[IU]/mL — AB (ref 0.35–4.50)

## 2017-04-26 MED ORDER — MONTELUKAST SODIUM 10 MG PO TABS: 10.0000 mg | ORAL_TABLET | Freq: Every day | ORAL | 3 refills | Status: DC

## 2017-04-26 MED ORDER — OMEPRAZOLE 20 MG PO CPDR: 20.0000 mg | DELAYED_RELEASE_CAPSULE | Freq: Two times a day (BID) | ORAL | 3 refills | Status: DC

## 2017-04-26 MED ORDER — FLUCONAZOLE 150 MG PO TABS: 150.0000 mg | ORAL_TABLET | Freq: Once | ORAL | 0 refills | Status: AC

## 2017-04-26 MED ORDER — LEVOTHYROXINE SODIUM 50 MCG PO TABS: 50.0000 ug | ORAL_TABLET | Freq: Every day | ORAL | 1 refills | Status: DC

## 2017-04-26 NOTE — Addendum Note (Signed)
Addended by: Raford Pitcher R on: 04/26/2017 04:16 PM   Modules accepted: Orders

## 2017-04-26 NOTE — Assessment & Plan Note (Signed)
Suspect from allergic rhinitis. Rx for singulair. She did not use flonase or claritin and she cannot tell us what she is taking for it. Reassurance given and no indication for antibiotics or steroids.

## 2017-04-26 NOTE — Assessment & Plan Note (Signed)
Ordered cologuard. Flu shot given at visit. Declines pneumonia shot today as she will only get 1. Counseled about shingles vaccine. Mammogram up to date. Declines tetanus as well. Counseled about sun safety and mole surveillance as well as the dangers of distracted driving. Given 10 year screening recommendations.

## 2017-04-26 NOTE — Telephone Encounter (Signed)
Order [id number]

## 2017-04-26 NOTE — Patient Instructions (Addendum)
We have sent in the diflucan for the yeast.   We have sent in the singulair for the ears which is a 1 pill daily medicine.   Health Maintenance, Female Adopting a healthy lifestyle and getting preventive care can go a long way to promote health and wellness. Talk with your health care provider about what schedule of regular examinations is right for you. This is a good chance for you to check in with your provider about disease prevention and staying healthy. In between checkups, there are plenty of things you can do on your own. Experts have done a lot of research about which lifestyle changes and preventive measures are most likely to keep you healthy. Ask your health care provider for more information. Weight and diet Eat a healthy diet  Be sure to include plenty of vegetables, fruits, low-fat dairy products, and lean protein.  Do not eat a lot of foods high in solid fats, added sugars, or salt.  Get regular exercise. This is one of the most important things you can do for your health. ? Most adults should exercise for at least 150 minutes each week. The exercise should increase your heart rate and make you sweat (moderate-intensity exercise). ? Most adults should also do strengthening exercises at least twice a week. This is in addition to the moderate-intensity exercise.  Maintain a healthy weight  Body mass index (BMI) is a measurement that can be used to identify possible weight problems. It estimates body fat based on height and weight. Your health care provider can help determine your BMI and help you achieve or maintain a healthy weight.  For females 42 years of age and older: ? A BMI below 18.5 is considered underweight. ? A BMI of 18.5 to 24.9 is normal. ? A BMI of 25 to 29.9 is considered overweight. ? A BMI of 30 and above is considered obese.  Watch levels of cholesterol and blood lipids  You should start having your blood tested for lipids and cholesterol at 69 years of  age, then have this test every 5 years.  You may need to have your cholesterol levels checked more often if: ? Your lipid or cholesterol levels are high. ? You are older than 69 years of age. ? You are at high risk for heart disease.  Cancer screening Lung Cancer  Lung cancer screening is recommended for adults 107-28 years old who are at high risk for lung cancer because of a history of smoking.  A yearly low-dose CT scan of the lungs is recommended for people who: ? Currently smoke. ? Have quit within the past 15 years. ? Have at least a 30-pack-year history of smoking. A pack year is smoking an average of one pack of cigarettes a day for 1 year.  Yearly screening should continue until it has been 15 years since you quit.  Yearly screening should stop if you develop a health problem that would prevent you from having lung cancer treatment.  Breast Cancer  Practice breast self-awareness. This means understanding how your breasts normally appear and feel.  It also means doing regular breast self-exams. Let your health care provider know about any changes, no matter how small.  If you are in your 20s or 30s, you should have a clinical breast exam (CBE) by a health care provider every 1-3 years as part of a regular health exam.  If you are 14 or older, have a CBE every year. Also consider having a breast X-ray (  mammogram) every year.  If you have a family history of breast cancer, talk to your health care provider about genetic screening.  If you are at high risk for breast cancer, talk to your health care provider about having an MRI and a mammogram every year.  Breast cancer gene (BRCA) assessment is recommended for women who have family members with BRCA-related cancers. BRCA-related cancers include: ? Breast. ? Ovarian. ? Tubal. ? Peritoneal cancers.  Results of the assessment will determine the need for genetic counseling and BRCA1 and BRCA2 testing.  Cervical  Cancer Your health care provider may recommend that you be screened regularly for cancer of the pelvic organs (ovaries, uterus, and vagina). This screening involves a pelvic examination, including checking for microscopic changes to the surface of your cervix (Pap test). You may be encouraged to have this screening done every 3 years, beginning at age 86.  For women ages 45-65, health care providers may recommend pelvic exams and Pap testing every 3 years, or they may recommend the Pap and pelvic exam, combined with testing for human papilloma virus (HPV), every 5 years. Some types of HPV increase your risk of cervical cancer. Testing for HPV may also be done on women of any age with unclear Pap test results.  Other health care providers may not recommend any screening for nonpregnant women who are considered low risk for pelvic cancer and who do not have symptoms. Ask your health care provider if a screening pelvic exam is right for you.  If you have had past treatment for cervical cancer or a condition that could lead to cancer, you need Pap tests and screening for cancer for at least 20 years after your treatment. If Pap tests have been discontinued, your risk factors (such as having a new sexual partner) need to be reassessed to determine if screening should resume. Some women have medical problems that increase the chance of getting cervical cancer. In these cases, your health care provider may recommend more frequent screening and Pap tests.  Colorectal Cancer  This type of cancer can be detected and often prevented.  Routine colorectal cancer screening usually begins at 68 years of age and continues through 69 years of age.  Your health care provider may recommend screening at an earlier age if you have risk factors for colon cancer.  Your health care provider may also recommend using home test kits to check for hidden blood in the stool.  A small camera at the end of a tube can be used to  examine your colon directly (sigmoidoscopy or colonoscopy). This is done to check for the earliest forms of colorectal cancer.  Routine screening usually begins at age 29.  Direct examination of the colon should be repeated every 5-10 years through 69 years of age. However, you may need to be screened more often if early forms of precancerous polyps or small growths are found.  Skin Cancer  Check your skin from head to toe regularly.  Tell your health care provider about any new moles or changes in moles, especially if there is a change in a mole's shape or color.  Also tell your health care provider if you have a mole that is larger than the size of a pencil eraser.  Always use sunscreen. Apply sunscreen liberally and repeatedly throughout the day.  Protect yourself by wearing long sleeves, pants, a wide-brimmed hat, and sunglasses whenever you are outside.  Heart disease, diabetes, and high blood pressure  High blood pressure  causes heart disease and increases the risk of stroke. High blood pressure is more likely to develop in: ? People who have blood pressure in the high end of the normal range (130-139/85-89 mm Hg). ? People who are overweight or obese. ? People who are African American.  If you are 79-44 years of age, have your blood pressure checked every 3-5 years. If you are 46 years of age or older, have your blood pressure checked every year. You should have your blood pressure measured twice-once when you are at a hospital or clinic, and once when you are not at a hospital or clinic. Record the average of the two measurements. To check your blood pressure when you are not at a hospital or clinic, you can use: ? An automated blood pressure machine at a pharmacy. ? A home blood pressure monitor.  If you are between 94 years and 72 years old, ask your health care provider if you should take aspirin to prevent strokes.  Have regular diabetes screenings. This involves taking a  blood sample to check your fasting blood sugar level. ? If you are at a normal weight and have a low risk for diabetes, have this test once every three years after 69 years of age. ? If you are overweight and have a high risk for diabetes, consider being tested at a younger age or more often. Preventing infection Hepatitis B  If you have a higher risk for hepatitis B, you should be screened for this virus. You are considered at high risk for hepatitis B if: ? You were born in a country where hepatitis B is common. Ask your health care provider which countries are considered high risk. ? Your parents were born in a high-risk country, and you have not been immunized against hepatitis B (hepatitis B vaccine). ? You have HIV or AIDS. ? You use needles to inject street drugs. ? You live with someone who has hepatitis B. ? You have had sex with someone who has hepatitis B. ? You get hemodialysis treatment. ? You take certain medicines for conditions, including cancer, organ transplantation, and autoimmune conditions.  Hepatitis C  Blood testing is recommended for: ? Everyone born from [birth year] through 1963. ? Anyone with known risk factors for hepatitis C.  Sexually transmitted infections (STIs)  You should be screened for sexually transmitted infections (STIs) including gonorrhea and chlamydia if: ? You are sexually active and are younger than 69 years of age. ? You are older than 69 years of age and your health care provider tells you that you are at risk for this type of infection. ? Your sexual activity has changed since you were last screened and you are at an increased risk for chlamydia or gonorrhea. Ask your health care provider if you are at risk.  If you do not have HIV, but are at risk, it may be recommended that you take a prescription medicine daily to prevent HIV infection. This is called pre-exposure prophylaxis (PrEP). You are considered at risk if: ? You are sexually active and  do not regularly use condoms or know the HIV status of your partner(s). ? You take drugs by injection. ? You are sexually active with a partner who has HIV.  Talk with your health care provider about whether you are at high risk of being infected with HIV. If you choose to begin PrEP, you should first be tested for HIV. You should then be tested every 3 months for as long  as you are taking PrEP. Pregnancy  If you are premenopausal and you may become pregnant, ask your health care provider about preconception counseling.  If you may become pregnant, take 400 to 800 micrograms (mcg) of folic acid every day.  If you want to prevent pregnancy, talk to your health care provider about birth control (contraception). Osteoporosis and menopause  Osteoporosis is a disease in which the bones lose minerals and strength with aging. This can result in serious bone fractures. Your risk for osteoporosis can be identified using a bone density scan.  If you are 78 years of age or older, or if you are at risk for osteoporosis and fractures, ask your health care provider if you should be screened.  Ask your health care provider whether you should take a calcium or vitamin D supplement to lower your risk for osteoporosis.  Menopause may have certain physical symptoms and risks.  Hormone replacement therapy may reduce some of these symptoms and risks. Talk to your health care provider about whether hormone replacement therapy is right for you. Follow these instructions at home:  Schedule regular health, dental, and eye exams.  Stay current with your immunizations.  Do not use any tobacco products including cigarettes, chewing tobacco, or electronic cigarettes.  If you are pregnant, do not drink alcohol.  If you are breastfeeding, limit how much and how often you drink alcohol.  Limit alcohol intake to no more than 1 drink per day for nonpregnant women. One drink equals 12 ounces of beer, 5 ounces of  wine, or 1 ounces of hard liquor.  Do not use street drugs.  Do not share needles.  Ask your health care provider for help if you need support or information about quitting drugs.  Tell your health care provider if you often feel depressed.  Tell your health care provider if you have ever been abused or do not feel safe at home. This information is not intended to replace advice given to you by your health care provider. Make sure you discuss any questions you have with your health care provider. Document Released: 11/06/2010 Document Revised: 09/29/2015 Document Reviewed: 01/25/2015 Elsevier Interactive Patient Education  [employer].

## 2017-04-26 NOTE — Progress Notes (Signed)
   Subjective:    Patient ID: Robin Valencia, female    DOB: March 27, 1946, 71 y.o.   MRN: [id number]  HPI Here for medicare wellness, no new complaints. Please see A/P for status and treatment of chronic medical problems.   HPI #2: here for follow up of her thyroid (taking synthroid daily, denies missing doses, takes separate from other meds and food, denies hot or cold intolerance), and her ear fluid (treated with flonase and claritin early in the year but never took flonase and is not taking claritin anymore, she does have mild drainage, some pressure and fluid in her ears, no hearing changes, no pain in her ears or itching, she does not put anything in her ears), and new problem of yeast infection (started 3-4 days ago, some burning, white discharge, has had several in the past, generally diflucan works, denies new sexual partner or concern for STD, no chance of pregnancy).   Diet: heart healthy Physical activity: sedentary Depression/mood screen: negative Hearing: intact to whispered voice Visual acuity: grossly normal, performs annual eye exam  ADLs: capable Fall risk: none Home safety: good Cognitive evaluation: intact to orientation, naming, recall and repetition EOL planning: adv directives discussed  I have personally reviewed and have noted 1. The patient's medical and social history - reviewed today no changes 2. Their use of alcohol, tobacco or illicit drugs 3. Their current medications and supplements 4. The patient's functional ability including ADL's, fall risks, home safety risks and hearing or visual impairment. 5. Diet and physical activities 6. Evidence for depression or mood disorders 7. Care team reviewed and updated (available in snapshot)  Review of Systems  Constitutional: Negative.   HENT: Positive for ear discharge. Negative for congestion, dental problem, nosebleeds, rhinorrhea, sinus pain, tinnitus, trouble swallowing and voice change.   Eyes: Negative.     Respiratory: Negative for cough, chest tightness and shortness of breath.   Cardiovascular: Negative for chest pain, palpitations and leg swelling.  Gastrointestinal: Negative for abdominal distention, abdominal pain, constipation, diarrhea, nausea and vomiting.  Genitourinary: Positive for vaginal discharge.  Musculoskeletal: Negative.   Skin: Negative.   Neurological: Negative.   Psychiatric/Behavioral: Negative.       Objective:   Physical Exam  Constitutional: She is oriented to person, place, and time. She appears well-developed and well-nourished.  HENT:  Head: Normocephalic and atraumatic.  Some fluid clear in the ears, TMs without bulging  Eyes: EOM are normal.  Neck: Normal range of motion.  Cardiovascular: Normal rate and regular rhythm.  Pulmonary/Chest: Effort normal and breath sounds normal. No respiratory distress. She has no wheezes. She has no rales.  Abdominal: Soft. Bowel sounds are normal. She exhibits no distension. There is no tenderness. There is no rebound.  Genitourinary:  Genitourinary Comments: Vaginal exam declined  Musculoskeletal: She exhibits no edema.  Neurological: She is alert and oriented to person, place, and time. Coordination normal.  Skin: Skin is warm and dry.  Psychiatric: She has a normal mood and affect.   Vitals:   04/26/17 1522  BP: 120/70  Pulse: 80  Temp: 98.4 F (36.9 C)  TempSrc: Oral  SpO2: 99%  Weight: 144 lb (65.3 kg)  Height: 5\' 4"  (1.626 m)      Assessment & Plan:  Flu shot given at visit

## 2017-04-26 NOTE — Assessment & Plan Note (Signed)
Checking TSH and adjust as needed her synthroid 50 mcg daily.

## 2017-04-26 NOTE — Assessment & Plan Note (Addendum)
Diflucan sent in to pharmacy. If no improvement needs visit with pelvic exam.

## 2017-05-02 ENCOUNTER — Other Ambulatory Visit: Payer: Self-pay

## 2017-05-02 ENCOUNTER — Ambulatory Visit: Payer: Self-pay | Admitting: Oncology

## 2017-05-03 ENCOUNTER — Other Ambulatory Visit: Payer: Self-pay

## 2017-05-03 ENCOUNTER — Ambulatory Visit: Payer: Medicare Other | Admitting: Psychology

## 2017-05-03 ENCOUNTER — Ambulatory Visit: Payer: Self-pay | Admitting: Oncology

## 2017-05-10 ENCOUNTER — Ambulatory Visit: Payer: Medicare Other | Admitting: Psychology

## 2017-05-14 ENCOUNTER — Telehealth: Payer: Self-pay | Admitting: Oncology

## 2017-05-14 NOTE — Telephone Encounter (Signed)
Patient called in to reschedule  °

## 2017-05-16 ENCOUNTER — Ambulatory Visit: Payer: Medicare Other | Admitting: Psychology

## 2017-05-17 ENCOUNTER — Other Ambulatory Visit: Payer: Self-pay

## 2017-05-17 ENCOUNTER — Ambulatory Visit: Payer: Self-pay | Admitting: Oncology

## 2017-05-17 ENCOUNTER — Ambulatory Visit (INDEPENDENT_AMBULATORY_CARE_PROVIDER_SITE_OTHER): Payer: Medicare Other | Admitting: Psychology

## 2017-05-17 DIAGNOSIS — F331 Major depressive disorder, recurrent, moderate: Secondary | ICD-10-CM

## 2017-05-27 NOTE — Progress Notes (Signed)
No show

## 2017-05-31 ENCOUNTER — Ambulatory Visit (INDEPENDENT_AMBULATORY_CARE_PROVIDER_SITE_OTHER): Payer: Medicare Other | Admitting: Psychology

## 2017-05-31 DIAGNOSIS — F331 Major depressive disorder, recurrent, moderate: Secondary | ICD-10-CM | POA: Diagnosis not present

## 2017-06-04 ENCOUNTER — Ambulatory Visit (HOSPITAL_BASED_OUTPATIENT_CLINIC_OR_DEPARTMENT_OTHER): Payer: Medicare Other | Admitting: Oncology

## 2017-06-04 ENCOUNTER — Other Ambulatory Visit: Payer: Self-pay

## 2017-06-04 ENCOUNTER — Encounter: Payer: Self-pay | Admitting: Oncology

## 2017-06-04 DIAGNOSIS — C50411 Malignant neoplasm of upper-outer quadrant of right female breast: Secondary | ICD-10-CM

## 2017-06-04 DIAGNOSIS — M810 Age-related osteoporosis without current pathological fracture: Secondary | ICD-10-CM

## 2017-06-04 DIAGNOSIS — Z171 Estrogen receptor negative status [ER-]: Secondary | ICD-10-CM

## 2017-06-11 ENCOUNTER — Ambulatory Visit (INDEPENDENT_AMBULATORY_CARE_PROVIDER_SITE_OTHER): Payer: Medicare Other | Admitting: Psychology

## 2017-06-11 ENCOUNTER — Telehealth: Payer: Self-pay | Admitting: Oncology

## 2017-06-11 DIAGNOSIS — F331 Major depressive disorder, recurrent, moderate: Secondary | ICD-10-CM

## 2017-06-11 NOTE — Telephone Encounter (Signed)
Left message for patient to call back to reschedule per patient request.

## 2017-06-20 ENCOUNTER — Ambulatory Visit (INDEPENDENT_AMBULATORY_CARE_PROVIDER_SITE_OTHER): Payer: Medicare Other | Admitting: Psychology

## 2017-06-20 DIAGNOSIS — F331 Major depressive disorder, recurrent, moderate: Secondary | ICD-10-CM

## 2017-06-28 ENCOUNTER — Ambulatory Visit (INDEPENDENT_AMBULATORY_CARE_PROVIDER_SITE_OTHER): Payer: Medicare Other | Admitting: Psychology

## 2017-06-28 DIAGNOSIS — F331 Major depressive disorder, recurrent, moderate: Secondary | ICD-10-CM

## 2017-07-02 ENCOUNTER — Ambulatory Visit: Payer: Self-pay | Admitting: Internal Medicine

## 2017-07-04 ENCOUNTER — Encounter: Payer: Self-pay | Admitting: Internal Medicine

## 2017-07-04 ENCOUNTER — Ambulatory Visit (INDEPENDENT_AMBULATORY_CARE_PROVIDER_SITE_OTHER): Payer: Medicare Other | Admitting: Internal Medicine

## 2017-07-04 DIAGNOSIS — B029 Zoster without complications: Secondary | ICD-10-CM

## 2017-07-04 DIAGNOSIS — A6000 Herpesviral infection of urogenital system, unspecified: Secondary | ICD-10-CM | POA: Insufficient documentation

## 2017-07-04 MED ORDER — VALACYCLOVIR HCL 1 G PO TABS: 1000.0000 mg | ORAL_TABLET | Freq: Three times a day (TID) | ORAL | 0 refills | Status: DC

## 2017-07-04 NOTE — Patient Instructions (Signed)
We have sent in the valtrex to take 1 pill 3 times per day.

## 2017-07-04 NOTE — Assessment & Plan Note (Signed)
Rx for valtrex 1 g PO TID for 1 week which will treat her genital herpes as well.

## 2017-07-04 NOTE — Progress Notes (Signed)
   Subjective:    Patient ID: Robin Valencia, female    DOB: Dec 25, 1945, 71 y.o.   MRN: [id number]  HPI The patient is a 72 YO female coming in for rash concerned about shingles. She is having rash on her chest and her back. She is also having genital herpes outbreak and is taking acyclovir for about 2 weeks now. She is worried that the prescription is old and not working as well as normal. Her mouth and genital outbreak are fading. The lesion on her chest is fading some as well but still painful and burning. There used to be blisters on it which are gone now.   Review of Systems  Constitutional: Negative.   HENT: Negative.   Eyes: Negative.   Respiratory: Negative for cough, chest tightness and shortness of breath.   Cardiovascular: Negative for chest pain, palpitations and leg swelling.  Gastrointestinal: Negative for abdominal distention, abdominal pain, constipation, diarrhea, nausea and vomiting.  Genitourinary: Positive for genital sores.  Musculoskeletal: Negative.   Skin: Positive for rash.  Neurological: Negative.   Psychiatric/Behavioral: Negative.       Objective:   Physical Exam  Constitutional: She is oriented to person, place, and time. She appears well-developed and well-nourished.  HENT:  Head: Normocephalic and atraumatic.  Eyes: EOM are normal.  Neck: Normal range of motion.  Cardiovascular: Normal rate and regular rhythm.  Pulmonary/Chest: Effort normal and breath sounds normal. No respiratory distress. She has no wheezes. She has no rales.  Abdominal: Soft. She exhibits no distension. There is no tenderness. There is no rebound.  Musculoskeletal: She exhibits no edema.  Neurological: She is alert and oriented to person, place, and time. Coordination normal.  Skin: Skin is warm and dry.  Rash on the left chest and left shoulder which have small blisters, painful to touch  Psychiatric: She has a normal mood and affect.   Vitals:   07/04/17 1347  BP: 132/86    Pulse: 77  Temp: 98.4 F (36.9 C)  TempSrc: Oral  SpO2: 98%  Weight: 147 lb (66.7 kg)  Height: 5\' 4"  (1.626 m)      Assessment & Plan:

## 2017-07-12 NOTE — Telephone Encounter (Signed)
Cancelled for inactivity  

## 2017-07-19 ENCOUNTER — Ambulatory Visit: Payer: Medicare Other | Admitting: Psychology

## 2017-07-22 ENCOUNTER — Other Ambulatory Visit: Payer: Self-pay | Admitting: Internal Medicine

## 2017-07-26 ENCOUNTER — Ambulatory Visit: Payer: Medicare Other | Admitting: Psychology

## 2017-07-26 DIAGNOSIS — M19012 Primary osteoarthritis, left shoulder: Secondary | ICD-10-CM | POA: Diagnosis not present

## 2017-08-01 ENCOUNTER — Ambulatory Visit (INDEPENDENT_AMBULATORY_CARE_PROVIDER_SITE_OTHER): Payer: Medicare Other | Admitting: Psychiatry

## 2017-08-01 ENCOUNTER — Encounter (HOSPITAL_COMMUNITY): Payer: Self-pay | Admitting: Psychiatry

## 2017-08-01 VITALS — BP 124/68 | HR 89 | Ht 67.5 in | Wt 147.0 lb

## 2017-08-01 DIAGNOSIS — Z818 Family history of other mental and behavioral disorders: Secondary | ICD-10-CM

## 2017-08-01 DIAGNOSIS — Z811 Family history of alcohol abuse and dependence: Secondary | ICD-10-CM | POA: Diagnosis not present

## 2017-08-01 DIAGNOSIS — F331 Major depressive disorder, recurrent, moderate: Secondary | ICD-10-CM

## 2017-08-01 DIAGNOSIS — R41840 Attention and concentration deficit: Secondary | ICD-10-CM | POA: Diagnosis not present

## 2017-08-01 MED ORDER — BUPROPION HCL ER (XL) 150 MG PO TB24: 150.0000 mg | ORAL_TABLET | ORAL | 3 refills | Status: DC

## 2017-08-01 MED ORDER — ARIPIPRAZOLE 2 MG PO TABS: 2.0000 mg | ORAL_TABLET | Freq: Every day | ORAL | 1 refills | Status: DC

## 2017-08-01 MED ORDER — BUPROPION HCL ER (XL) 300 MG PO TB24: 300.0000 mg | ORAL_TABLET | ORAL | 3 refills | Status: DC

## 2017-08-01 MED ORDER — CITALOPRAM HYDROBROMIDE 40 MG PO TABS: 40.0000 mg | ORAL_TABLET | Freq: Every day | ORAL | 3 refills | Status: DC

## 2017-08-01 NOTE — Progress Notes (Signed)
[facility] MD/PA/NP OP Progress Note  08/01/2017 2:52 PM Robin Valencia  MRN:  [id number]  Chief Complaint: med management, depression  HPI: Robin Valencia reports that she has been working well with Dr. Cheryln Valencia and this has been quite helpful.  She does feel like her depression has been a little bit stuck, and although the Celexa and Wellbutrin do seem to provide benefit, she does seem to continue to struggle with anger and dysphoria, and feels discouraged at times.  She reports that she puts on a happy face for everybody, but inside and underneath she feels very sad and hopeless. we discussed augmentation with aripiprazole, and reviewed the risks and benefits of atypical antipsychotic, including EPS, TD with long-term use, and risk factors associated with elderly age and atypical antipsychotic.  I suggested we circumscribed treatment with Abilify to approximately 6-12 months to help her with some forward momentum.  She was agreeable to this.  Denies any suicidal thoughts or self-injurious behaviors.  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  Visit Diagnosis:    ICD-10-CM   1. Attention and concentration deficit R41.840   2. Major depressive disorder, recurrent episode, moderate (HCC) F33.1 citalopram (CELEXA) 40 MG tablet    buPROPion (WELLBUTRIN XL) 300 MG 24 hr tablet    buPROPion (WELLBUTRIN XL) 150 MG 24 hr tablet    ARIPiprazole (ABILIFY) 2 MG tablet    Past Psychiatric History: See intake H&P for full details. Reviewed, with no updates at this time.  Past Medical History:  Past Medical History:  Diagnosis Date  . Arthritis   . Breast cancer of upper-outer quadrant of right female breast ([place])   . Depression   . Eating disorder   . History of stomach ulcers   . Hypothyroidism   . Jaundice   . Pancreatitis 2009  . Personal history of chemotherapy   . Personal history  of radiation therapy   . Reflux esophagitis     Past Surgical History:  Procedure Laterality Date  . BREAST LUMPECTOMY    . BREAST SURGERY    . CESAREAN SECTION    . CESAREAN SECTION WITH BILATERAL TUBAL LIGATION    . CHOLECYSTECTOMY    . EXPLORATORY LAPAROTOMY    . MASTECTOMY Right   . THYROIDECTOMY    . TONSILLECTOMY      Family Psychiatric History: See intake H&P for full details. Reviewed, with no updates at this time.   Family History:  Family History  Problem Relation Age of Onset  . Arthritis Mother   . Mental illness Mother   . Depression Mother   . Alcohol abuse Father   . Hyperlipidemia Father   . Heart disease Father   . Stroke Father   . Hypertension Father   . Hypertension Sister   . Mental illness Sister   . Breast cancer Sister 62  . Mental illness Brother   . Depression Brother   . Cancer Paternal Grandfather        prostate  . Breast cancer Maternal Aunt     Social History:  Social History   Socioeconomic History  . Marital status: Divorced    Spouse name: Not on file  . Number of children: Not on file  . Years of education: Not on file  . Highest education level: Not on file  Occupational History  . Not on file  Social Needs  . Financial resource strain: Not on file  . Food insecurity:    Worry: Not on file    Inability: Not on file  . Transportation needs:    Medical: Not on file    Non-medical: Not on file  Tobacco Use  . Smoking status: Never Smoker  . Smokeless tobacco: Never Used  Substance and Sexual Activity  . Alcohol use: No  . Drug use: No  . Sexual activity: Never  Lifestyle  . Physical activity:    Days per week: Not on file    Minutes per session: Not on file  . Stress: Not on file  Relationships  . Social connections:    Talks on phone: Not on file    Gets together: Not on file    Attends religious service: Not on file    Active member of club or organization: Not on file    Attends meetings of clubs or  organizations: Not on file    Relationship status: Not on file  Other Topics Concern  . Not on file  Social History Narrative  . Not on file    Allergies:  Allergies  Allergen Reactions  . Codeine Nausea And Vomiting  . Gabapentin Nausea And Vomiting  . Sulfa Antibiotics Diarrhea    Metabolic Disorder Labs: No results found for: HGBA1C, MPG No results found for: PROLACTIN Lab Results  Component Value Date   CHOL 195 04/26/2017   TRIG 125.0 04/26/2017   HDL 56.70 04/26/2017   CHOLHDL 3 04/26/2017   VLDL 25.0 04/26/2017   LDLCALC 113 (H) 04/26/2017   Lab Results  Component Value Date   TSH 5.14 (H) 04/26/2017   TSH 0.50 11/19/2016    Therapeutic Level Labs: No results found for: LITHIUM No results found for: VALPROATE No components found for:  CBMZ  Current Medications: Current Outpatient Medications  Medication Sig Dispense Refill  . buPROPion (WELLBUTRIN XL) 150 MG 24 hr tablet Take 1 tablet (150 mg total) by mouth every morning. Take with 300 for a total of 450 mg daily 90 tablet 3  . buPROPion (WELLBUTRIN XL) 300 MG 24 hr tablet Take 1 tablet (300 mg total) by mouth every morning. Take with 150 mg for a total of 450 mg daily 90 tablet 3  . citalopram (CELEXA) 40 MG tablet Take 1 tablet (40 mg total) by mouth daily. 90 tablet 3  . levothyroxine (SYNTHROID, LEVOTHROID) 50 MCG tablet Take 1 tablet (50 mcg total) by mouth daily before breakfast. 90 tablet 1  . montelukast (SINGULAIR) 10 MG tablet Take 1 tablet (10 mg total) by mouth at bedtime. 30 tablet 3  . omeprazole (PRILOSEC) 20 MG capsule Take 1 capsule (20 mg total) by mouth 2 (two) times daily. 180 capsule 3  . valACYclovir (VALTREX) 1000 MG tablet Take 1 tablet (1,000 mg total) by mouth 3 (three) times daily. 21 tablet 0  . vitamin B-12 (CYANOCOBALAMIN) 100 MCG tablet Take 100 mcg by mouth daily.    . ARIPiprazole (ABILIFY) 2 MG tablet Take 1 tablet (2 mg total) by mouth daily. 90 tablet 1   No current  facility-administered medications for this visit.      Musculoskeletal: Strength & Muscle Tone: within normal limits Gait & Station: normal Patient leans: N/A  Psychiatric Specialty Exam: ROS  Blood pressure 124/68, pulse 89, height 5' 7.5" (1.715 m), weight 147 lb (66.7 kg), SpO2 95 %.Body mass index is 22.68 kg/m.  General Appearance: Casual and Well Groomed  Eye Contact:  Good  Speech:  Clear and Coherent and Normal Rate  Volume:  Normal  Mood:  Dysphoric  Affect:  Appropriate and Congruent  Thought Process:  Goal Directed and Descriptions of Associations: Intact  Orientation:  Full (Time, Place, and Person)  Thought Content: Logical   Suicidal Thoughts:  No  Homicidal Thoughts:  No  Memory:  Immediate;   Fair  Judgement:  Fair  Insight:  Fair  Psychomotor Activity:  Normal  Concentration:  Attention Span: Good  Recall:  Good  Fund of Knowledge: Good  Language: Good  Akathisia:  Negative  Handed:  Right  AIMS (if indicated): not done  Assets:  Communication Skills Desire for Improvement Financial Resources/Insurance Housing Transportation Vocational/Educational  ADL's:  Intact  Cognition: WNL  Sleep:  Good   Screenings: PHQ2-9     Office Visit from 10/25/2015 in [facility]  PHQ-2 Total Score  6  PHQ-9 Total Score  24       Assessment and Plan:  Braniyah Besse presents with ongoing depression and dysphoria, irritability, and hopelessness.  She feels like her depression symptoms are a bit stuck, although she continues to work diligently with Dr. Cheryln Valencia.  We discussed augmentation of her current regimen with Abilify 2 mg daily, and reviewed the risks and benefits of atypical antipsychotic including EPS, TD, and metabolic.  She was agreeable to proceed as below and follow-up in 3-4 months.  1. Attention and concentration deficit   2. Major depressive disorder, recurrent episode, moderate (HCC)     Status of current problems:  unchanged  Labs Ordered: No orders of the defined types were placed in this encounter.   Labs Reviewed: n/a  Collateral Obtained/Records Reviewed: n/a  Plan:  Continue Wellbutrin 450 mg XL daily Continue Celexa 40 mg daily Initiate aripiprazole 2 mg daily  I spent 15 minutes with the patient in direct face-to-face clinical care.  Greater than 50% of this time was spent in counseling and coordination of care with the patient.    Aundra Dubin, MD 08/01/2017, 2:52 PM

## 2017-08-02 ENCOUNTER — Ambulatory Visit (INDEPENDENT_AMBULATORY_CARE_PROVIDER_SITE_OTHER): Payer: Medicare Other | Admitting: Psychology

## 2017-08-02 DIAGNOSIS — F331 Major depressive disorder, recurrent, moderate: Secondary | ICD-10-CM

## 2017-08-09 ENCOUNTER — Ambulatory Visit (INDEPENDENT_AMBULATORY_CARE_PROVIDER_SITE_OTHER): Payer: Medicare Other | Admitting: Psychology

## 2017-08-09 DIAGNOSIS — F331 Major depressive disorder, recurrent, moderate: Secondary | ICD-10-CM

## 2017-08-30 ENCOUNTER — Ambulatory Visit: Payer: Medicare Other | Admitting: Psychology

## 2017-08-30 ENCOUNTER — Telehealth: Payer: Self-pay

## 2017-08-30 NOTE — Telephone Encounter (Signed)
Patient called to cancel current appointment due to work hours. She stated she will check her schedule and see what day she can r/s. She also said that she preferr late afternoon on Fridays. Per 4/26 phone que

## 2017-09-02 ENCOUNTER — Other Ambulatory Visit: Payer: Self-pay

## 2017-09-02 ENCOUNTER — Ambulatory Visit: Payer: Self-pay | Admitting: Oncology

## 2017-09-05 ENCOUNTER — Ambulatory Visit: Payer: Medicare Other | Admitting: Internal Medicine

## 2017-09-05 DIAGNOSIS — Z0289 Encounter for other administrative examinations: Secondary | ICD-10-CM

## 2017-09-06 ENCOUNTER — Ambulatory Visit (INDEPENDENT_AMBULATORY_CARE_PROVIDER_SITE_OTHER): Payer: Medicare Other | Admitting: Psychology

## 2017-09-06 DIAGNOSIS — F331 Major depressive disorder, recurrent, moderate: Secondary | ICD-10-CM

## 2017-09-12 ENCOUNTER — Ambulatory Visit: Payer: Medicare Other | Admitting: Psychology

## 2017-10-03 ENCOUNTER — Ambulatory Visit (INDEPENDENT_AMBULATORY_CARE_PROVIDER_SITE_OTHER): Payer: Medicare Other | Admitting: Psychology

## 2017-10-03 DIAGNOSIS — F331 Major depressive disorder, recurrent, moderate: Secondary | ICD-10-CM

## 2017-10-04 ENCOUNTER — Ambulatory Visit: Payer: Medicare Other | Admitting: Psychology

## 2017-10-10 ENCOUNTER — Ambulatory Visit (INDEPENDENT_AMBULATORY_CARE_PROVIDER_SITE_OTHER): Payer: Medicare Other | Admitting: Psychology

## 2017-10-10 DIAGNOSIS — F331 Major depressive disorder, recurrent, moderate: Secondary | ICD-10-CM

## 2017-10-18 ENCOUNTER — Encounter: Payer: Self-pay | Admitting: Internal Medicine

## 2017-10-18 ENCOUNTER — Ambulatory Visit (INDEPENDENT_AMBULATORY_CARE_PROVIDER_SITE_OTHER): Payer: Medicare Other | Admitting: Internal Medicine

## 2017-10-18 DIAGNOSIS — R21 Rash and other nonspecific skin eruption: Secondary | ICD-10-CM

## 2017-10-18 DIAGNOSIS — A6000 Herpesviral infection of urogenital system, unspecified: Secondary | ICD-10-CM

## 2017-10-18 MED ORDER — VALACYCLOVIR HCL 1 G PO TABS: 1000.0000 mg | ORAL_TABLET | Freq: Two times a day (BID) | ORAL | 0 refills | Status: DC

## 2017-10-18 MED ORDER — NYSTATIN-TRIAMCINOLONE 100000-0.1 UNIT/GM-% EX OINT: 1.0000 "application " | TOPICAL_OINTMENT | Freq: Two times a day (BID) | CUTANEOUS | 0 refills | Status: DC

## 2017-10-18 NOTE — Patient Instructions (Signed)
We have sent in the cream to use twice a day on the rash.

## 2017-10-18 NOTE — Progress Notes (Signed)
   Subjective:    Patient ID: Robin Valencia, female    DOB: 1945-06-04, 72 y.o.   MRN: [id number]  HPI The patient is a 72 YO female coming in for rash. She did have this back in February and thought it was shingles. She did come in and take valtrex. She did have burning and pain at that time. She did have resolution of the pain and burning. She still has a rash but this is getting bigger in the last several months. She denies new soap, lotion, detergent, etc. No change in medications. The rash is worsening. She has not tried any otc creams.  She is also still having a genital herpes outbreak. The valtrex from several months ago helped to decrease this but it did not go away. She denies sore but is having some burning and discomfort. Denies fevers or chills. No new partners.   Review of Systems  Constitutional: Negative.   HENT: Negative.   Eyes: Negative.   Respiratory: Negative for cough, chest tightness and shortness of breath.   Cardiovascular: Negative for chest pain, palpitations and leg swelling.  Gastrointestinal: Negative for abdominal distention, abdominal pain, constipation, diarrhea, nausea and vomiting.  Genitourinary: Positive for genital sores. Negative for decreased urine volume, difficulty urinating, dysuria, enuresis, frequency, vaginal bleeding, vaginal discharge and vaginal pain.  Musculoskeletal: Negative.   Skin: Positive for rash.  Neurological: Negative.   Psychiatric/Behavioral: Negative.       Objective:   Physical Exam  Constitutional: She is oriented to person, place, and time. She appears well-developed and well-nourished.  HENT:  Head: Normocephalic and atraumatic.  Eyes: EOM are normal.  Neck: Normal range of motion.  Cardiovascular: Normal rate and regular rhythm.  Pulmonary/Chest: Effort normal and breath sounds normal. No respiratory distress. She has no wheezes. She has no rales.  Abdominal: Soft. Bowel sounds are normal. She exhibits no distension.  There is no tenderness. There is no rebound.  Genitourinary:  Genitourinary Comments: Declines exam  Musculoskeletal: She exhibits no edema.  Neurological: She is alert and oriented to person, place, and time. Coordination normal.  Skin: Skin is warm and dry. Rash noted.  Rash circular midline chest level of clavicle. No blistering.   Psychiatric: She has a normal mood and affect.   Vitals:   10/18/17 1419  BP: 100/72  Pulse: 79  Temp: 98.5 F (36.9 C)  TempSrc: Oral  SpO2: 97%  Weight: 155 lb (70.3 kg)  Height: 5' 7.5" (1.715 m)      Assessment & Plan:

## 2017-10-19 ENCOUNTER — Encounter: Payer: Self-pay | Admitting: Internal Medicine

## 2017-10-19 DIAGNOSIS — R21 Rash and other nonspecific skin eruption: Secondary | ICD-10-CM | POA: Insufficient documentation

## 2017-10-19 NOTE — Assessment & Plan Note (Signed)
Ongoing and rx for mycolog cream. Previous course of valtrex for possible shingles did improve rash some and pain but likely now infected with yeast infection to the skin.

## 2017-10-19 NOTE — Assessment & Plan Note (Signed)
Rx for valtrex for recurrent problem.

## 2017-10-24 ENCOUNTER — Ambulatory Visit (INDEPENDENT_AMBULATORY_CARE_PROVIDER_SITE_OTHER): Payer: Medicare Other | Admitting: Psychology

## 2017-10-24 DIAGNOSIS — F331 Major depressive disorder, recurrent, moderate: Secondary | ICD-10-CM

## 2017-11-01 ENCOUNTER — Ambulatory Visit (HOSPITAL_COMMUNITY): Payer: Medicare Other | Admitting: Psychiatry

## 2017-11-05 ENCOUNTER — Ambulatory Visit (INDEPENDENT_AMBULATORY_CARE_PROVIDER_SITE_OTHER): Payer: Medicare Other | Admitting: Psychiatry

## 2017-11-05 ENCOUNTER — Encounter (HOSPITAL_COMMUNITY): Payer: Self-pay | Admitting: Psychiatry

## 2017-11-05 DIAGNOSIS — F331 Major depressive disorder, recurrent, moderate: Secondary | ICD-10-CM | POA: Diagnosis not present

## 2017-11-05 MED ORDER — ARIPIPRAZOLE 5 MG PO TABS: 5.0000 mg | ORAL_TABLET | Freq: Every day | ORAL | 0 refills | Status: DC

## 2017-11-05 MED ORDER — TOPIRAMATE 25 MG PO TABS: 25.0000 mg | ORAL_TABLET | Freq: Every day | ORAL | 0 refills | Status: DC

## 2017-11-05 NOTE — Progress Notes (Signed)
[facility] MD/PA/NP OP Progress Note  11/05/2017 4:37 PM Robin Valencia  MRN:  [id number]  Chief Complaint: med management, doing so much better  HPI: Robin Valencia presents with substantial improvement of her mood, reports that her friends have told her she seems like she is doing remarkably better.  She denies any unsafe thoughts and reports that she is making progress and working with Dr. Cheryln Manly.  She has not had any significant side effects with Abilify and reports that the only concern she has is that the plateau of affect seems to have started to occur over the past 2-3 weeks.  She does not feel anywhere near where she was.  I educated her again on the risks and benefits including metabolic, EPS, and tardive dyskinesia.  She has noted about 8 pounds weight gain, so I suggested a low-dose of Topamax to help with curbing her appetite and r reviewed the risks and benefits including SJS, fatigue, and sedation.  We agreed to increase Abilify to 2.5 mg, then 5 mg daily if needed.  We will continue Wellbutrin and Celexa as prescribed and follow-up in 2-3 months.  Visit Diagnosis:    ICD-10-CM   1. Major depressive disorder, recurrent episode, moderate (HCC) F33.1 ARIPiprazole (ABILIFY) 5 MG tablet    Past Psychiatric History: See intake H&P for full details. Reviewed, with no updates at this time.  Past Medical History:  Past Medical History:  Diagnosis Date  . Arthritis   . Breast cancer of upper-outer quadrant of right female breast ([place])   . Depression   . Eating disorder   . History of stomach ulcers   . Hypothyroidism   . Jaundice   . Pancreatitis 2009  . Personal history of chemotherapy   . Personal history of radiation therapy   . Reflux esophagitis     Past Surgical History:  Procedure Laterality Date  . BREAST LUMPECTOMY    . BREAST SURGERY    . CESAREAN SECTION    . CESAREAN SECTION WITH BILATERAL TUBAL LIGATION    . CHOLECYSTECTOMY    . EXPLORATORY LAPAROTOMY    .  MASTECTOMY Right   . THYROIDECTOMY    . TONSILLECTOMY      Family Psychiatric History: See intake H&P for full details. Reviewed, with no updates at this time.   Family History:  Family History  Problem Relation Age of Onset  . Arthritis Mother   . Mental illness Mother   . Depression Mother   . Alcohol abuse Father   . Hyperlipidemia Father   . Heart disease Father   . Stroke Father   . Hypertension Father   . Hypertension Sister   . Mental illness Sister   . Breast cancer Sister 75  . Mental illness Brother   . Depression Brother   . Cancer Paternal Grandfather        prostate  . Breast cancer Maternal Aunt     Social History:  Social History   Socioeconomic History  . Marital status: Divorced    Spouse name: Not on file  . Number of children: Not on file  . Years of education: Not on file  . Highest education level: Not on file  Occupational History  . Not on file  Social Needs  . Financial resource strain: Not on file  . Food insecurity:    Worry: Not on file    Inability: Not on file  . Transportation needs:    Medical: Not on file    Non-medical:  Not on file  Tobacco Use  . Smoking status: Never Smoker  . Smokeless tobacco: Never Used  Substance and Sexual Activity  . Alcohol use: No  . Drug use: No  . Sexual activity: Never  Lifestyle  . Physical activity:    Days per week: Not on file    Minutes per session: Not on file  . Stress: Not on file  Relationships  . Social connections:    Talks on phone: Not on file    Gets together: Not on file    Attends religious service: Not on file    Active member of club or organization: Not on file    Attends meetings of clubs or organizations: Not on file    Relationship status: Not on file  Other Topics Concern  . Not on file  Social History Narrative  . Not on file    Allergies:  Allergies  Allergen Reactions  . Codeine Nausea And Vomiting  . Gabapentin Nausea And Vomiting  . Sulfa  Antibiotics Diarrhea    Metabolic Disorder Labs: No results found for: HGBA1C, MPG No results found for: PROLACTIN Lab Results  Component Value Date   CHOL 195 04/26/2017   TRIG 125.0 04/26/2017   HDL 56.70 04/26/2017   CHOLHDL 3 04/26/2017   VLDL 25.0 04/26/2017   LDLCALC 113 (H) 04/26/2017   Lab Results  Component Value Date   TSH 5.14 (H) 04/26/2017   TSH 0.50 11/19/2016    Therapeutic Level Labs: No results found for: LITHIUM No results found for: VALPROATE No components found for:  CBMZ  Current Medications: Current Outpatient Medications  Medication Sig Dispense Refill  . ARIPiprazole (ABILIFY) 5 MG tablet Take 1 tablet (5 mg total) by mouth daily. 90 tablet 0  . buPROPion (WELLBUTRIN XL) 150 MG 24 hr tablet Take 1 tablet (150 mg total) by mouth every morning. Take with 300 for a total of 450 mg daily 90 tablet 3  . buPROPion (WELLBUTRIN XL) 300 MG 24 hr tablet Take 1 tablet (300 mg total) by mouth every morning. Take with 150 mg for a total of 450 mg daily 90 tablet 3  . citalopram (CELEXA) 40 MG tablet Take 1 tablet (40 mg total) by mouth daily. 90 tablet 3  . levothyroxine (SYNTHROID, LEVOTHROID) 50 MCG tablet Take 1 tablet (50 mcg total) by mouth daily before breakfast. 90 tablet 1  . montelukast (SINGULAIR) 10 MG tablet Take 1 tablet (10 mg total) by mouth at bedtime. 30 tablet 3  . nystatin-triamcinolone ointment (MYCOLOG) Apply 1 application topically 2 (two) times daily. 60 g 0  . omeprazole (PRILOSEC) 20 MG capsule Take 1 capsule (20 mg total) by mouth 2 (two) times daily. 180 capsule 3  . topiramate (TOPAMAX) 25 MG tablet Take 1 tablet (25 mg total) by mouth at bedtime. 90 tablet 0  . valACYclovir (VALTREX) 1000 MG tablet Take 1 tablet (1,000 mg total) by mouth 2 (two) times daily. 30 tablet 0  . vitamin B-12 (CYANOCOBALAMIN) 100 MCG tablet Take 100 mcg by mouth daily.     No current facility-administered medications for this visit.       Musculoskeletal: Strength & Muscle Tone: within normal limits Gait & Station: normal Patient leans: N/A  Psychiatric Specialty Exam: ROS  There were no vitals taken for this visit.There is no height or weight on file to calculate BMI.  General Appearance: Casual and Well Groomed  Eye Contact:  Good  Speech:  Clear and Coherent and Normal  Rate  Volume:  Normal  Mood:  Euthymic and Much better  Affect:  Appropriate and Congruent  Thought Process:  Goal Directed and Descriptions of Associations: Intact  Orientation:  Full (Time, Place, and Person)  Thought Content: Logical   Suicidal Thoughts:  No  Homicidal Thoughts:  No  Memory:  Immediate;   Fair  Judgement:  Fair  Insight:  Fair  Psychomotor Activity:  Normal  Concentration:  Attention Span: Good  Recall:  Good  Fund of Knowledge: Good  Language: Good  Akathisia:  Negative  Handed:  Right  AIMS (if indicated): not done  Assets:  Communication Skills Desire for Improvement Financial Resources/Insurance Housing Transportation Vocational/Educational  ADL's:  Intact  Cognition: WNL  Sleep:  Good   Screenings: PHQ2-9     Office Visit from 10/25/2015 in [facility]  PHQ-2 Total Score  6  PHQ-9 Total Score  24       Assessment and Plan:  Robin Valencia presents with significant improvement in her mood symptoms with the augmentation of her medication regimen with Abilify.  She is actively participating in therapy and doing more things with her friends and family.  She feels substantially better and reports that she feels closer to her normal self than she has felt in many years.  She has gained about 8 pounds, but would like to stick with the Abilify.  I suggested she may consider Rexulti in the future, but this is cost inhibiting for her because she does not have medication pharmaceutical coverage with her Medicare program.  We agreed to augment with Abilify with Topamax for some of the  weight gain and side effects associated with appetite increase.  We agreed to follow-up in 2-3 months or sooner if needed and she is aware that writer is leaving this clinic at the end of August.  1. Major depressive disorder, recurrent episode, moderate ([place])     Status of current problems: unchanged  Labs Ordered: No orders of the defined types were placed in this encounter.   Labs Reviewed: n/a  Collateral Obtained/Records Reviewed: n/a  Plan:  Continue Wellbutrin 450 mg XL daily Continue Celexa 40 mg daily Aripiprazole 2.5 mg daily RTC 2-3 months Topamax 25 mg nightly for weight gain 2/2 to AAP use  Aundra Dubin, MD 11/05/2017, 4:37 PM

## 2017-11-11 ENCOUNTER — Other Ambulatory Visit (HOSPITAL_COMMUNITY): Payer: Self-pay

## 2017-11-11 DIAGNOSIS — F331 Major depressive disorder, recurrent, moderate: Secondary | ICD-10-CM

## 2017-11-11 MED ORDER — TOPIRAMATE 25 MG PO TABS
25.0000 mg | ORAL_TABLET | Freq: Every day | ORAL | 0 refills | Status: DC
Start: 2017-11-11 — End: 2018-01-02

## 2017-12-04 DIAGNOSIS — M19012 Primary osteoarthritis, left shoulder: Secondary | ICD-10-CM | POA: Diagnosis not present

## 2017-12-11 ENCOUNTER — Telehealth: Payer: Self-pay | Admitting: Oncology

## 2017-12-11 NOTE — Telephone Encounter (Signed)
Patient called to reschedule  °

## 2017-12-13 ENCOUNTER — Other Ambulatory Visit: Payer: Self-pay | Admitting: Internal Medicine

## 2017-12-13 DIAGNOSIS — E890 Postprocedural hypothyroidism: Secondary | ICD-10-CM

## 2017-12-14 DIAGNOSIS — L259 Unspecified contact dermatitis, unspecified cause: Secondary | ICD-10-CM | POA: Diagnosis not present

## 2017-12-14 DIAGNOSIS — L03115 Cellulitis of right lower limb: Secondary | ICD-10-CM | POA: Diagnosis not present

## 2017-12-19 ENCOUNTER — Ambulatory Visit (INDEPENDENT_AMBULATORY_CARE_PROVIDER_SITE_OTHER): Payer: Medicare Other | Admitting: Psychology

## 2017-12-19 DIAGNOSIS — F331 Major depressive disorder, recurrent, moderate: Secondary | ICD-10-CM

## 2017-12-30 DIAGNOSIS — L259 Unspecified contact dermatitis, unspecified cause: Secondary | ICD-10-CM | POA: Diagnosis not present

## 2018-01-02 ENCOUNTER — Ambulatory Visit (HOSPITAL_COMMUNITY): Payer: Self-pay | Admitting: Psychiatry

## 2018-01-02 ENCOUNTER — Encounter (HOSPITAL_COMMUNITY): Payer: Self-pay | Admitting: Psychiatry

## 2018-01-02 ENCOUNTER — Ambulatory Visit (INDEPENDENT_AMBULATORY_CARE_PROVIDER_SITE_OTHER): Payer: Medicare Other | Admitting: Psychiatry

## 2018-01-02 VITALS — BP 120/69 | HR 82 | Ht 64.0 in | Wt 160.0 lb

## 2018-01-02 DIAGNOSIS — R41840 Attention and concentration deficit: Secondary | ICD-10-CM | POA: Diagnosis not present

## 2018-01-02 DIAGNOSIS — F331 Major depressive disorder, recurrent, moderate: Secondary | ICD-10-CM

## 2018-01-02 MED ORDER — METHYLPHENIDATE HCL 5 MG PO TABS: 5.0000 mg | ORAL_TABLET | Freq: Two times a day (BID) | ORAL | 0 refills | Status: DC

## 2018-01-02 NOTE — Progress Notes (Signed)
[place] MD/PA/NP OP Progress Note  01/02/2018 2:30 PM Krishika Bugge  MRN:  [id number]  Chief Complaint: med management  HPI: Erna Brossard reports that she tapered off the Abilify because the weight gain was intolerable.  She reports the Abilify made her feel better than she is felt in her entire life and she is sad that the weight gain is such as a difficult issue.  The Topamax did not really affect her weight in any way.  We agreed to discontinue Topamax and restart Ritalin.  She has previously been on stimulants for depression augmentation with good effect.  I reviewed the risks and benefits of methylphenidate with her including habit forming nature, increased risk of seizure, tachycardia, cardiac palpitations, headaches, anxiety, dry mouth.  We agreed to follow-up in 1 month or sooner if needed.  We may ultimately reintroduce Abilify in conjunction with Ritalin to help mitigate some of the weight gain.  Visit Diagnosis:    ICD-10-CM   1. Major depressive disorder, recurrent episode, moderate (HCC) F33.1 methylphenidate (RITALIN) 5 MG tablet  2. Attention and concentration deficit R41.840 methylphenidate (RITALIN) 5 MG tablet    Past Psychiatric History: See intake H&P for full details. Reviewed, with no updates at this time.  Past Medical History:  Past Medical History:  Diagnosis Date  . Arthritis   . Breast cancer of upper-outer quadrant of right female breast ([place])   . Depression   . Eating disorder   . History of stomach ulcers   . Hypothyroidism   . Jaundice   . Pancreatitis 2009  . Personal history of chemotherapy   . Personal history of radiation therapy   . Reflux esophagitis     Past Surgical History:  Procedure Laterality Date  . BREAST LUMPECTOMY    . BREAST SURGERY    . CESAREAN SECTION    . CESAREAN SECTION WITH BILATERAL TUBAL LIGATION    . CHOLECYSTECTOMY    . EXPLORATORY LAPAROTOMY    . MASTECTOMY Right   . THYROIDECTOMY    . TONSILLECTOMY      Family  Psychiatric History: See intake H&P for full details. Reviewed, with no updates at this time.   Family History:  Family History  Problem Relation Age of Onset  . Arthritis Mother   . Mental illness Mother   . Depression Mother   . Alcohol abuse Father   . Hyperlipidemia Father   . Heart disease Father   . Stroke Father   . Hypertension Father   . Hypertension Sister   . Mental illness Sister   . Breast cancer Sister 13  . Mental illness Brother   . Depression Brother   . Cancer Paternal Grandfather        prostate  . Breast cancer Maternal Aunt     Social History:  Social History   Socioeconomic History  . Marital status: Divorced    Spouse name: Not on file  . Number of children: Not on file  . Years of education: Not on file  . Highest education level: Not on file  Occupational History  . Not on file  Social Needs  . Financial resource strain: Not on file  . Food insecurity:    Worry: Not on file    Inability: Not on file  . Transportation needs:    Medical: Not on file    Non-medical: Not on file  Tobacco Use  . Smoking status: Never Smoker  . Smokeless tobacco: Never Used  Substance and Sexual Activity  .  Alcohol use: No  . Drug use: No  . Sexual activity: Never  Lifestyle  . Physical activity:    Days per week: Not on file    Minutes per session: Not on file  . Stress: Not on file  Relationships  . Social connections:    Talks on phone: Not on file    Gets together: Not on file    Attends religious service: Not on file    Active member of club or organization: Not on file    Attends meetings of clubs or organizations: Not on file    Relationship status: Not on file  Other Topics Concern  . Not on file  Social History Narrative  . Not on file    Allergies:  Allergies  Allergen Reactions  . Codeine Nausea And Vomiting  . Gabapentin Nausea And Vomiting  . Sulfa Antibiotics Diarrhea    Metabolic Disorder Labs: No results found for:  HGBA1C, MPG No results found for: PROLACTIN Lab Results  Component Value Date   CHOL 195 04/26/2017   TRIG 125.0 04/26/2017   HDL 56.70 04/26/2017   CHOLHDL 3 04/26/2017   VLDL 25.0 04/26/2017   LDLCALC 113 (H) 04/26/2017   Lab Results  Component Value Date   TSH 5.14 (H) 04/26/2017   TSH 0.50 11/19/2016    Therapeutic Level Labs: No results found for: LITHIUM No results found for: VALPROATE No components found for:  CBMZ  Current Medications: Current Outpatient Medications  Medication Sig Dispense Refill  . buPROPion (WELLBUTRIN XL) 150 MG 24 hr tablet Take 1 tablet (150 mg total) by mouth every morning. Take with 300 for a total of 450 mg daily 90 tablet 3  . buPROPion (WELLBUTRIN XL) 300 MG 24 hr tablet Take 1 tablet (300 mg total) by mouth every morning. Take with 150 mg for a total of 450 mg daily 90 tablet 3  . citalopram (CELEXA) 40 MG tablet Take 1 tablet (40 mg total) by mouth daily. 90 tablet 3  . levothyroxine (SYNTHROID, LEVOTHROID) 50 MCG tablet  TAKE ONE TABLET BY MOUTH DAILY BEFORE BREAKFAST. 90 tablet 1  . meloxicam (MOBIC) 7.5 MG tablet Take 7.5 mg by mouth daily.    . montelukast (SINGULAIR) 10 MG tablet Take 1 tablet (10 mg total) by mouth at bedtime. 30 tablet 3  . nystatin-triamcinolone ointment (MYCOLOG) Apply 1 application topically 2 (two) times daily. 60 g 0  . valACYclovir (VALTREX) 1000 MG tablet Take 1 tablet (1,000 mg total) by mouth 2 (two) times daily. 30 tablet 0  . vitamin B-12 (CYANOCOBALAMIN) 100 MCG tablet Take 100 mcg by mouth daily.    . methylphenidate (RITALIN) 5 MG tablet Take 1 tablet (5 mg total) by mouth 2 (two) times daily with breakfast and lunch. 60 tablet 0  . omeprazole (PRILOSEC) 20 MG capsule Take 1 capsule (20 mg total) by mouth 2 (two) times daily. (Patient not taking: Reported on 01/02/2018) 180 capsule 3   No current facility-administered medications for this visit.     Musculoskeletal: Strength & Muscle Tone: within  normal limits Gait & Station: normal Patient leans: N/A  Psychiatric Specialty Exam: ROS  Blood pressure 120/69, pulse 82, height 5\' 4"  (1.626 m), weight 160 lb (72.6 kg), SpO2 96 %.Body mass index is 27.46 kg/m.  General Appearance: Casual and Well Groomed  Eye Contact:  Good  Speech:  Clear and Coherent and Normal Rate  Volume:  Normal  Mood:  Dysphoric  Affect:  Appropriate and Congruent  Thought Process:  Goal Directed and Descriptions of Associations: Intact  Orientation:  Full (Time, Place, and Person)  Thought Content: Logical   Suicidal Thoughts:  No  Homicidal Thoughts:  No  Memory:  Immediate;   Fair  Judgement:  Fair  Insight:  Fair  Psychomotor Activity:  Normal  Concentration:  Attention Span: Good  Recall:  Good  Fund of Knowledge: Good  Language: Good  Akathisia:  Negative  Handed:  Right  AIMS (if indicated): not done  Assets:  Communication Skills Desire for Improvement Financial Resources/Insurance Housing Transportation Vocational/Educational  ADL's:  Intact  Cognition: WNL  Sleep:  Good   Screenings: PHQ2-9     Office Visit from 10/25/2015 in [facility]  PHQ-2 Total Score  6  PHQ-9 Total Score  24       Assessment and Plan:  Shantoria Ellwood presents after self tapering off of Abilify.  She gained around 14 pounds from initiation.  We agreed to augment her antidepressant regimen with methylphenidate.  She has previously been on Vyvanse with good results but I have concerns about the use of dextroamphetamine in the elderly.  Educated her on the side effects and risks associated with stimulants and we agreed to follow-up in 1 month.  No acute safety concerns at this time.  1. Major depressive disorder, recurrent episode, moderate ([place])   2. Attention and concentration deficit     Status of current problems: unchanged  Labs Ordered: No orders of the defined types were placed in this encounter.   Labs Reviewed:  n/a  Collateral Obtained/Records Reviewed: n/a  Plan:  Continue Wellbutrin 450 mg XL daily Continue Celexa 40 mg daily Ritalin 5 mg BID RTC 2-3 months  Aundra Dubin, MD 01/02/2018, 2:30 PM

## 2018-01-03 ENCOUNTER — Ambulatory Visit: Payer: Medicare Other | Admitting: Psychology

## 2018-01-07 DIAGNOSIS — L309 Dermatitis, unspecified: Secondary | ICD-10-CM | POA: Diagnosis not present

## 2018-01-07 DIAGNOSIS — L089 Local infection of the skin and subcutaneous tissue, unspecified: Secondary | ICD-10-CM | POA: Diagnosis not present

## 2018-01-16 ENCOUNTER — Ambulatory Visit: Payer: Medicare Other | Admitting: Psychology

## 2018-01-31 DIAGNOSIS — F332 Major depressive disorder, recurrent severe without psychotic features: Secondary | ICD-10-CM | POA: Diagnosis not present

## 2018-02-02 NOTE — Progress Notes (Signed)
[place]  [phone] [phone]     ID: Robin Valencia DOB: Jul 06, 1945  MR#: [id number]  LXB#:[id number]  Patient Care Team: Robin Koch, MD as PCP - General (Internal Medicine) Valencia, Robin Dad, MD as Consulting Physician (Oncology) PCP: Robin Koch, MD GYN: SU:  OTHER MD: Robin Jacobson MD  CHIEF COMPLAINT: Triple negative breast cancer  CURRENT TREATMENT: Observation   BREAST CANCER HISTORY: From the original intake note:  Robin Valencia tells me she palpated a change in her right breast January 2015. It was "very high, almost outside the breast". She contacted her primary care physician, underwent mammography and ultrasonography, (I do not have those records) and on 06/03/2013 underwent biopsy of a mass described as "T2 NX) in the upper outer quadrant of the right breast. The pathology from that procedure [facility] [id number]) showed an invasive ductal carcinoma, grade 3, triple negative, with an MIB-1 of 91%.  The patient was treated neoadjuvantly with cyclophosphamide and doxorubicin between 07/23/2013 and 09/30/2013. There were no dose reductions, but there were delays between the second and third cycle (4 weeks (and the third and fourth (3 weeks). This was followed by paclitaxel weekly 12, with no treatment delays, but dose reduction of 20% with the final 4 doses. This was completed 01/14/2014.  On 02/19/2014 the patient underwent right mastectomy and sentinel lymph node sampling, with the final pathology ([id number]) showing a complete pathologic response in the breast (no invasive disease and no DCIS) and all 4 sentinel lymph nodes clear. The patient then proceeded to adjuvant radiation. I do not have the radiation records.  Her subsequent history is as detailed below  INTERVAL HISTORY: Robin Valencia returns today for follow-up of her estrogen receptor negative breast cancer. She continues under observation.  Since her last visit here she  underwent bilateral diagnostic mammography with tomography 04/18/2017 at the [facility] showing breast density category B.  No evidence of malignancy.    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 She was upset that her dog has not been feeling well--she thought the dog would die, but it actually has gotten better.  She has 1 grandchild and 1 on the way in [place]. She walks her dogs for 20 minutes together. She is seeing Dr. Trey Valencia for her depression. She was placed on Rexulti in addition to her Celexa and Wellbutrin. She tries to get out the house and visit her sister. She denies unusual headaches, visual changes, nausea, vomiting, or dizziness. There has been no unusual cough, phlegm production, or pleurisy. There has been no change in bowel or bladder habits. She denies unexplained fatigue or unexplained weight loss, bleeding, rash, or fever. A detailed review of systems was otherwise stable.    PAST MEDICAL HISTORY: Past Medical History:  Diagnosis Date  . Arthritis   . Breast cancer of upper-outer quadrant of right female breast ([place])   . Depression   . Eating disorder   . History of stomach ulcers   . Hypothyroidism   . Jaundice   . Pancreatitis 2009  . Personal history of chemotherapy   . Personal history of radiation therapy   . Reflux esophagitis     PAST SURGICAL HISTORY: Past Surgical History:  Procedure Laterality Date  . BREAST LUMPECTOMY    . BREAST SURGERY    . CESAREAN SECTION    . CESAREAN SECTION WITH BILATERAL TUBAL LIGATION    . CHOLECYSTECTOMY    .  EXPLORATORY LAPAROTOMY    . MASTECTOMY Right   . THYROIDECTOMY    . TONSILLECTOMY      FAMILY HISTORY Family History  Problem Relation Age of Onset  . Arthritis Mother   . Mental illness Mother   . Depression Mother   . Alcohol abuse Father   . Hyperlipidemia Father   . Heart disease Father   . Stroke Father   . Hypertension Father   . Hypertension  Sister   . Mental illness Sister   . Breast cancer Sister 63  . Mental illness Brother   . Depression Brother   . Cancer Paternal Grandfather        prostate  . Breast cancer Maternal Aunt    The patient's father died from a stroke at the age of 18. The patient's mother died at the age of 50. The patient had one brother, 2 sisters. One sister was diagnosed with breast cancer in her 42s. One maternal aunt was diagnosed with breast cancer in her 24s. There is no history of ovarian cancer in the family.  GYNECOLOGIC HISTORY:  No LMP recorded. Patient is postmenopausal. Menarche age 69, first live birth age 68, which the patient understands increases the risk of breast cancer. She went through the change of life in 1989, and used hormone replacement for approximately 10 years, which also increases the risk of breast cancer developing. 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 She is divorced, and lives by herself with 2 Robin Valencia. Her son Robin Valencia is a foreign service operative currently in [place]. He is married. The patient has 1 grandchild and another on the way. She attends a [employer].    ADVANCED DIRECTIVES: In place. The patient's son is her healthcare power of attorney  HEALTH MAINTENANCE: Social History   Tobacco Use  . Smoking status: Never Smoker  . Smokeless tobacco: Never Used  Substance Use Topics  . Alcohol use: No  . Drug use: No     Colonoscopy:  PAP:  Bone density:  Lipid panel:  Allergies  Allergen Reactions  . Codeine Nausea And Vomiting  . Gabapentin Nausea And Vomiting  . Sulfa Antibiotics Diarrhea    Current Outpatient Medications  Medication Sig Dispense Refill  . buPROPion (WELLBUTRIN XL) 150 MG 24 hr tablet Take 1 tablet (150 mg total) by mouth every morning. Take  with 300 for a total of 450 mg daily 90 tablet 3  . buPROPion (WELLBUTRIN XL) 300 MG 24 hr tablet Take 1 tablet (300 mg total) by mouth every morning. Take with 150 mg for a total of 450 mg daily 90 tablet 3  . citalopram (CELEXA) 40 MG tablet Take 1 tablet (40 mg total) by mouth daily. 90 tablet 3  . levothyroxine (SYNTHROID, LEVOTHROID) 50 MCG tablet  TAKE ONE TABLET BY MOUTH DAILY BEFORE BREAKFAST. 90 tablet 1  . meloxicam (MOBIC) 7.5 MG tablet Take 7.5 mg by mouth daily.    . methylphenidate (RITALIN) 5 MG tablet Take 1 tablet (5 mg total) by mouth 2 (two) times daily with breakfast and lunch. 60 tablet 0  . montelukast (SINGULAIR) 10 MG tablet Take 1 tablet (10 mg total) by mouth at bedtime. 30 tablet 3  . nystatin-triamcinolone ointment (MYCOLOG) Apply 1 application topically 2 (two) times daily. 60 g 0  . omeprazole (PRILOSEC) 20 MG  capsule Take 1 capsule (20 mg total) by mouth 2 (two) times daily. (Patient not taking: Reported on 01/02/2018) 180 capsule 3  . valACYclovir (VALTREX) 1000 MG tablet Take 1 tablet (1,000 mg total) by mouth 2 (two) times daily. 30 tablet 0  . vitamin B-12 (CYANOCOBALAMIN) 100 MCG tablet Take 100 mcg by mouth daily.     No current facility-administered medications for this visit.     OBJECTIVE: Middle-aged white woman in no acute distress  Vitals:   02/03/18 1508  BP: (!) 130/59  Pulse: 84  Resp: 18  Temp: 98.5 F (36.9 C)  SpO2: 97%     Body mass index is 27.58 kg/m.    ECOG FS:1 - Symptomatic but completely ambulatory  Sclerae unicteric, pupils round and equal No cervical or supraclavicular adenopathy Lungs no rales or rhonchi Heart regular rate and rhythm Abd soft, nontender, positive bowel sounds MSK no focal spinal tenderness, no upper extremity lymphedema Neuro: nonfocal, well oriented, appropriate affect Breasts: Right breast is status post lumpectomy and radiation.  There is no evidence of local recurrence.  The left breast is benign.  Both  axillae are benign.  LAB RESULTS:  CMP     Component Value Date/Time   NA 141 04/26/2017 1556   NA 144 08/01/2015 1013   K 3.3 (L) 04/26/2017 1556   K 3.9 08/01/2015 1013   CL 109 04/26/2017 1556   CO2 22 04/26/2017 1556   CO2 25 08/01/2015 1013   GLUCOSE 96 04/26/2017 1556   GLUCOSE 95 08/01/2015 1013   BUN 22 04/26/2017 1556   BUN 21.6 08/01/2015 1013   CREATININE 0.89 04/26/2017 1556   CREATININE 0.8 08/01/2015 1013   CALCIUM 8.9 04/26/2017 1556   CALCIUM 8.6 08/01/2015 1013   PROT 6.9 04/26/2017 1556   PROT 6.3 (L) 08/01/2015 1013   ALBUMIN 4.3 04/26/2017 1556   ALBUMIN 3.6 08/01/2015 1013   AST 13 04/26/2017 1556   AST 25 08/01/2015 1013   ALT 13 04/26/2017 1556   ALT 23 08/01/2015 1013   ALKPHOS 94 04/26/2017 1556   ALKPHOS 96 08/01/2015 1013   BILITOT 0.6 04/26/2017 1556   BILITOT 0.67 08/01/2015 1013   GFRNONAA >60 05/04/2015 1811   GFRAA >60 05/04/2015 1811    INo results found for: SPEP, UPEP  Lab Results  Component Value Date   WBC 5.7 02/03/2018   NEUTROABS 3.3 02/03/2018   HGB 13.7 02/03/2018   HCT 40.3 02/03/2018   MCV 92.3 02/03/2018   PLT 245 02/03/2018      Chemistry      Component Value Date/Time   NA 141 04/26/2017 1556   NA 144 08/01/2015 1013   K 3.3 (L) 04/26/2017 1556   K 3.9 08/01/2015 1013   CL 109 04/26/2017 1556   CO2 22 04/26/2017 1556   CO2 25 08/01/2015 1013   BUN 22 04/26/2017 1556   BUN 21.6 08/01/2015 1013   CREATININE 0.89 04/26/2017 1556   CREATININE 0.8 08/01/2015 1013      Component Value Date/Time   CALCIUM 8.9 04/26/2017 1556   CALCIUM 8.6 08/01/2015 1013   ALKPHOS 94 04/26/2017 1556   ALKPHOS 96 08/01/2015 1013   AST 13 04/26/2017 1556   AST 25 08/01/2015 1013   ALT 13 04/26/2017 1556   ALT 23 08/01/2015 1013   BILITOT 0.6 04/26/2017 1556   BILITOT 0.67 08/01/2015 1013       Lab Results  Component Value Date   LABCA2 30 04/07/2015  No components found for: LABCA125  No results for  input(s): INR in the last 168 hours.  Urinalysis    Component Value Date/Time   COLORURINE YELLOW 05/05/2015 0154   APPEARANCEUR CLOUDY (A) 05/05/2015 0154   LABSPEC 1.023 05/05/2015 0154   PHURINE 6.0 05/05/2015 0154   GLUCOSEU NEGATIVE 05/05/2015 0154   HGBUR TRACE (A) 05/05/2015 0154   BILIRUBINUR neg 11/19/2016 1013   KETONESUR 15 (A) 05/05/2015 0154   PROTEINUR neg 11/19/2016 1013   PROTEINUR NEGATIVE 05/05/2015 0154   UROBILINOGEN 0.2 11/19/2016 1013   NITRITE neg 11/19/2016 1013   NITRITE NEGATIVE 05/05/2015 0154   LEUKOCYTESUR Large (3+) (A) 11/19/2016 1013    STUDIES: Due for mammography in December 2019. December 2018 diagnostic mammography showed breast density category B and no evidence of malignancy.   ASSESSMENT: 72 y.o. [place], [place] woman status post right breast upper outer quadrant biopsy 06/04/2013 for a clinical T2 N0, stage IIA invasive ductal carcinoma, triple negative, with an MIB-1 of 91%  (1) neoadjuvant chemotherapy consisted of doxorubicin and cyclophosphamide 4 followed by weekly Taxol 12  (a) some treatment delays with doxorubicin/cyclophosphamide cycles  (b) 20% dose reduction final for Taxol cycles  (2) status post right lumpectomy and sentinel lymph node sampling 02/19/2014 showing a complete pathologic response (ypT0, ypN0)  (3) status post adjuvant radiation  (4) genetics counseling considered--low risk for family mutation to be present  PLAN:  Robin Valencia is now 4 years out from definitive surgery for breast cancer with no evidence of disease recurrence.  This is very favorable.  She is managing her depression fairly well.  I did encourage her to get out more and be as active as possible.  It certainly is helpful that she is working.  Her dogs are also a big help to her.  She will see me one more time a year from now at which point she will be ready to "graduate".  She knows to call for any other issues that may develop before that  visit.   Valencia, Robin Dad, MD  02/03/18 3:27 PM Medical Oncology and Hematology [facility] [address] [place], [place] [address] Tel. [phone]    Fax. [phone]  Alice Rieger, am acting as scribe for Chauncey Cruel MD.  I, Lurline Del MD, have reviewed the above documentation for accuracy and completeness, and I agree with the above.

## 2018-02-03 ENCOUNTER — Inpatient Hospital Stay: Payer: Medicare Other | Attending: Oncology

## 2018-02-03 ENCOUNTER — Inpatient Hospital Stay (HOSPITAL_BASED_OUTPATIENT_CLINIC_OR_DEPARTMENT_OTHER): Payer: Medicare Other | Admitting: Oncology

## 2018-02-03 VITALS — BP 130/59 | HR 84 | Temp 98.5°F | Resp 18 | Ht 64.0 in | Wt 160.7 lb

## 2018-02-03 DIAGNOSIS — Z9221 Personal history of antineoplastic chemotherapy: Secondary | ICD-10-CM

## 2018-02-03 DIAGNOSIS — F329 Major depressive disorder, single episode, unspecified: Secondary | ICD-10-CM | POA: Diagnosis not present

## 2018-02-03 DIAGNOSIS — Z923 Personal history of irradiation: Secondary | ICD-10-CM | POA: Insufficient documentation

## 2018-02-03 DIAGNOSIS — Z853 Personal history of malignant neoplasm of breast: Secondary | ICD-10-CM

## 2018-02-03 DIAGNOSIS — C50411 Malignant neoplasm of upper-outer quadrant of right female breast: Secondary | ICD-10-CM

## 2018-02-03 DIAGNOSIS — Z171 Estrogen receptor negative status [ER-]: Principal | ICD-10-CM

## 2018-02-03 LAB — CBC WITH DIFFERENTIAL/PLATELET
BASOS ABS: 0 10*3/uL (ref 0.0–0.1)
Basophils Relative: 1 %
Eosinophils Absolute: 0.1 10*3/uL (ref 0.0–0.5)
Eosinophils Relative: 2 %
HCT: 40.3 % (ref 34.8–46.6)
HEMOGLOBIN: 13.7 g/dL (ref 11.6–15.9)
Lymphocytes Relative: 29 %
Lymphs Abs: 1.7 10*3/uL (ref 0.9–3.3)
MCH: 31.4 pg (ref 25.1–34.0)
MCHC: 34 g/dL (ref 31.5–36.0)
MCV: 92.3 fL (ref 79.5–101.0)
MONO ABS: 0.5 10*3/uL (ref 0.1–0.9)
MONOS PCT: 9 %
NEUTROS ABS: 3.3 10*3/uL (ref 1.5–6.5)
NEUTROS PCT: 59 %
Platelets: 245 10*3/uL (ref 145–400)
RBC: 4.37 MIL/uL (ref 3.70–5.45)
RDW: 14.9 % — AB (ref 11.2–14.5)
WBC: 5.7 10*3/uL (ref 3.9–10.3)

## 2018-02-03 LAB — COMPREHENSIVE METABOLIC PANEL
ALBUMIN: 3.9 g/dL (ref 3.5–5.0)
ALT: 26 U/L (ref 0–44)
ANION GAP: 8 (ref 5–15)
AST: 21 U/L (ref 15–41)
Alkaline Phosphatase: 114 U/L (ref 38–126)
BUN: 28 mg/dL — ABNORMAL HIGH (ref 8–23)
CO2: 25 mmol/L (ref 22–32)
Calcium: 9.3 mg/dL (ref 8.9–10.3)
Chloride: 109 mmol/L (ref 98–111)
Creatinine, Ser: 0.86 mg/dL (ref 0.44–1.00)
GFR calc Af Amer: >60 mL/min (ref >60)
GFR calc non Af Amer: >60 mL/min (ref >60)
GLUCOSE: 113 mg/dL — AB (ref 70–99)
POTASSIUM: 3.5 mmol/L (ref 3.5–5.1)
SODIUM: 142 mmol/L (ref 135–145)
Total Bilirubin: 0.7 mg/dL (ref 0.3–1.2)
Total Protein: 6.8 g/dL (ref 6.5–8.1)

## 2018-02-21 DIAGNOSIS — M19012 Primary osteoarthritis, left shoulder: Secondary | ICD-10-CM | POA: Diagnosis not present

## 2018-03-13 ENCOUNTER — Ambulatory Visit: Payer: Medicare Other | Admitting: Psychology

## 2018-03-27 ENCOUNTER — Ambulatory Visit: Payer: Medicare Other | Admitting: Psychology

## 2018-03-28 DIAGNOSIS — R4586 Emotional lability: Secondary | ICD-10-CM | POA: Diagnosis not present

## 2018-03-28 DIAGNOSIS — F332 Major depressive disorder, recurrent severe without psychotic features: Secondary | ICD-10-CM | POA: Diagnosis not present

## 2018-03-31 ENCOUNTER — Ambulatory Visit (INDEPENDENT_AMBULATORY_CARE_PROVIDER_SITE_OTHER): Payer: Medicare Other | Admitting: Psychology

## 2018-03-31 DIAGNOSIS — F331 Major depressive disorder, recurrent, moderate: Secondary | ICD-10-CM

## 2018-04-08 ENCOUNTER — Ambulatory Visit: Payer: Self-pay

## 2018-04-08 MED ORDER — FLUCONAZOLE 150 MG PO TABS: 150.0000 mg | ORAL_TABLET | Freq: Once | ORAL | 0 refills | Status: DC

## 2018-04-08 NOTE — Telephone Encounter (Signed)
Pt. Reports 3 weeks ago she had oral surgery and was on an antibiotic. She developed a vaginal yeast infection. Tried OTC medication. It helped a little, "but has come back with a vengeance." Request medication be called in.Uses [facility] on [address]. Please advise pt.

## 2018-04-08 NOTE — Telephone Encounter (Signed)
LVM with MD response  

## 2018-04-08 NOTE — Telephone Encounter (Signed)
Pharmacy not updated prior to message so diflucan sent to [facility] which is her listed pharmacy. She can fill there.

## 2018-04-17 ENCOUNTER — Other Ambulatory Visit: Payer: Self-pay | Admitting: Internal Medicine

## 2018-04-17 NOTE — Telephone Encounter (Signed)
LVM for patient to call back and let us know if she is requesting this medication

## 2018-04-17 NOTE — Telephone Encounter (Signed)
Does patient need refill or this is from pharmacy?

## 2018-04-18 NOTE — Telephone Encounter (Signed)
Patient called back stating she is needing a refill.

## 2018-04-25 ENCOUNTER — Ambulatory Visit (INDEPENDENT_AMBULATORY_CARE_PROVIDER_SITE_OTHER): Payer: Medicare Other | Admitting: Psychology

## 2018-04-25 DIAGNOSIS — F331 Major depressive disorder, recurrent, moderate: Secondary | ICD-10-CM

## 2018-04-28 ENCOUNTER — Other Ambulatory Visit: Payer: Self-pay | Admitting: Internal Medicine

## 2018-05-02 ENCOUNTER — Ambulatory Visit: Payer: Medicare Other | Admitting: Psychology

## 2018-05-14 DIAGNOSIS — J069 Acute upper respiratory infection, unspecified: Secondary | ICD-10-CM | POA: Diagnosis not present

## 2018-05-14 DIAGNOSIS — L400 Psoriasis vulgaris: Secondary | ICD-10-CM | POA: Diagnosis not present

## 2018-05-18 DIAGNOSIS — R05 Cough: Secondary | ICD-10-CM | POA: Diagnosis not present

## 2018-05-18 DIAGNOSIS — J069 Acute upper respiratory infection, unspecified: Secondary | ICD-10-CM | POA: Diagnosis not present

## 2018-05-27 ENCOUNTER — Ambulatory Visit (INDEPENDENT_AMBULATORY_CARE_PROVIDER_SITE_OTHER): Payer: Medicare Other | Admitting: Internal Medicine

## 2018-05-27 ENCOUNTER — Encounter: Payer: Self-pay | Admitting: Internal Medicine

## 2018-05-27 ENCOUNTER — Other Ambulatory Visit (INDEPENDENT_AMBULATORY_CARE_PROVIDER_SITE_OTHER): Payer: Medicare Other

## 2018-05-27 VITALS — BP 120/82 | HR 80 | Temp 98.2°F | Ht 64.0 in | Wt 162.0 lb

## 2018-05-27 DIAGNOSIS — E559 Vitamin D deficiency, unspecified: Secondary | ICD-10-CM

## 2018-05-27 DIAGNOSIS — K2100 Gastro-esophageal reflux disease with esophagitis, without bleeding: Secondary | ICD-10-CM

## 2018-05-27 DIAGNOSIS — Z13220 Encounter for screening for lipoid disorders: Secondary | ICD-10-CM

## 2018-05-27 DIAGNOSIS — E890 Postprocedural hypothyroidism: Secondary | ICD-10-CM | POA: Diagnosis not present

## 2018-05-27 DIAGNOSIS — E538 Deficiency of other specified B group vitamins: Secondary | ICD-10-CM | POA: Diagnosis not present

## 2018-05-27 DIAGNOSIS — J40 Bronchitis, not specified as acute or chronic: Secondary | ICD-10-CM | POA: Diagnosis not present

## 2018-05-27 DIAGNOSIS — Z23 Encounter for immunization: Secondary | ICD-10-CM | POA: Diagnosis not present

## 2018-05-27 LAB — TSH: TSH: 2.66 u[IU]/mL (ref 0.35–4.50)

## 2018-05-27 LAB — LIPID PANEL
Cholesterol: 237 mg/dL — ABNORMAL HIGH (ref 0–200)
HDL: 67.4 mg/dL (ref >39.00)
LDL Cholesterol: 133 mg/dL — ABNORMAL HIGH (ref 0–99)
NonHDL: 169.1
Total CHOL/HDL Ratio: 4
Triglycerides: 182 mg/dL — ABNORMAL HIGH (ref 0.0–149.0)
VLDL: 36.4 mg/dL (ref 0.0–40.0)

## 2018-05-27 LAB — COMPREHENSIVE METABOLIC PANEL
ALT: 26 U/L (ref 0–35)
AST: 19 U/L (ref 0–37)
Albumin: 4.6 g/dL (ref 3.5–5.2)
Alkaline Phosphatase: 106 U/L (ref 39–117)
BUN: 28 mg/dL — ABNORMAL HIGH (ref 6–23)
CO2: 27 meq/L (ref 19–32)
Calcium: 10 mg/dL (ref 8.4–10.5)
Chloride: 101 mEq/L (ref 96–112)
Creatinine, Ser: 0.82 mg/dL (ref 0.40–1.20)
GFR: 68.74 mL/min (ref >60.00)
Glucose, Bld: 91 mg/dL (ref 70–99)
Potassium: 4.2 mEq/L (ref 3.5–5.1)
Sodium: 140 mEq/L (ref 135–145)
Total Bilirubin: 0.8 mg/dL (ref 0.2–1.2)
Total Protein: 7.4 g/dL (ref 6.0–8.3)

## 2018-05-27 LAB — CBC
HEMATOCRIT: 47.2 % — AB (ref 36.0–46.0)
Hemoglobin: 16.4 g/dL — ABNORMAL HIGH (ref 12.0–15.0)
MCHC: 34.8 g/dL (ref 30.0–36.0)
MCV: 91.7 fl (ref 78.0–100.0)
Platelets: 242 10*3/uL (ref 150.0–400.0)
RBC: 5.14 Mil/uL — ABNORMAL HIGH (ref 3.87–5.11)
RDW: 14 % (ref 11.5–15.5)
WBC: 13.4 10*3/uL — ABNORMAL HIGH (ref 4.0–10.5)

## 2018-05-27 MED ORDER — FLUCONAZOLE 150 MG PO TABS: 150.0000 mg | ORAL_TABLET | ORAL | 0 refills | Status: AC

## 2018-05-27 MED ORDER — LIDOCAINE VISCOUS HCL 2 % MT SOLN: 15.0000 mL | OROMUCOSAL | 3 refills | Status: DC | PRN

## 2018-05-27 MED ORDER — OMEPRAZOLE 20 MG PO CPDR: 20.0000 mg | DELAYED_RELEASE_CAPSULE | Freq: Two times a day (BID) | ORAL | 3 refills | Status: DC

## 2018-05-27 MED ORDER — LEVOTHYROXINE SODIUM 50 MCG PO TABS: ORAL_TABLET | ORAL | 3 refills | Status: DC

## 2018-05-27 NOTE — Patient Instructions (Addendum)
We have sent in lidocaine liquid to use on the tongue for pain.   We are checking the labs today.

## 2018-05-27 NOTE — Assessment & Plan Note (Signed)
Checking TSH and adjust synthroid 50 mcg daily as needed. 

## 2018-05-27 NOTE — Progress Notes (Signed)
   Subjective:   Patient ID: Robin Valencia, female    DOB: 27-Jan-1946, 73 y.o.   MRN: [id number]  HPI The patient is a 73 YO female coming in for bronchitis (over the last 3 weeks she has been suffering, went to another facility and got prednisone and antibiotics, then they were changed to augmentin and got steroid injection, this was about 1-2 weeks ago, yesterday she started really feeling better, still coughing some, denies SOB currently although she did have this, was wheezing some previous, denies fevers or chills currently, also tried cough medicine which did not help, has yeast infection from antibiotics for the last 4-5 days) and thyroid (taking her medication, denies side effects, denies weight change, diarrhea or constipation, heat or cold intolerance), and GERD (taking omeprazole BID and this is still controlling her symptoms, she has tried to stop several times and not had success, symptoms did return, dietary changes did not help enough)  Review of Systems  Constitutional: Negative.   HENT: Negative.   Eyes: Negative.   Respiratory: Positive for cough. Negative for chest tightness and shortness of breath.   Cardiovascular: Negative for chest pain, palpitations and leg swelling.  Gastrointestinal: Negative for abdominal distention, abdominal pain, constipation, diarrhea, nausea and vomiting.  Musculoskeletal: Negative.   Skin: Negative.   Neurological: Negative.   Psychiatric/Behavioral: Negative.     Objective:  Physical Exam Constitutional:      Appearance: She is well-developed.  HENT:     Head: Normocephalic and atraumatic.  Neck:     Musculoskeletal: Normal range of motion.  Cardiovascular:     Rate and Rhythm: Normal rate and regular rhythm.  Pulmonary:     Effort: Pulmonary effort is normal. No respiratory distress.     Breath sounds: Normal breath sounds. No wheezing or rales.  Abdominal:     General: Bowel sounds are normal. There is no distension.   Palpations: Abdomen is soft.     Tenderness: There is no abdominal tenderness. There is no rebound.  Skin:    General: Skin is warm and dry.  Neurological:     Mental Status: She is alert and oriented to person, place, and time.     Coordination: Coordination normal.     Vitals:   05/27/18 1553  BP: 120/82  Pulse: 79  Temp: 98.2 F (36.8 C)  TempSrc: Oral  SpO2: (!) 80%  Weight: 162 lb (73.5 kg)  Height: 5\' 4"  (1.626 m)    Assessment & Plan:  Flu shot given at visit

## 2018-05-27 NOTE — Assessment & Plan Note (Signed)
Rx for diflucan for yeast infection likely precipitated by antibiotics. She is clear lungs and okay to get flu shot today. No indication for more steroids or antibiotics. Advised that the cough will likely take several more weeks to resolve.

## 2018-05-27 NOTE — Assessment & Plan Note (Signed)
Omeprazole 20 mg BID is currently controlling her symptoms and she does have symptoms with lesser dose or lesser medication. Risk/benefit discussed.

## 2018-05-29 ENCOUNTER — Ambulatory Visit (INDEPENDENT_AMBULATORY_CARE_PROVIDER_SITE_OTHER): Payer: Medicare Other | Admitting: Psychology

## 2018-05-29 DIAGNOSIS — F331 Major depressive disorder, recurrent, moderate: Secondary | ICD-10-CM

## 2018-05-29 LAB — VITAMIN D 25 HYDROXY (VIT D DEFICIENCY, FRACTURES): VITD: 48.64 ng/mL (ref 30.00–100.00)

## 2018-05-29 LAB — VITAMIN B12: Vitamin B-12: >1525 pg/mL — ABNORMAL HIGH (ref 211–911)

## 2018-06-03 ENCOUNTER — Other Ambulatory Visit: Payer: Self-pay | Admitting: Oncology

## 2018-06-03 DIAGNOSIS — Z853 Personal history of malignant neoplasm of breast: Secondary | ICD-10-CM

## 2018-06-05 ENCOUNTER — Other Ambulatory Visit: Payer: Self-pay | Admitting: Internal Medicine

## 2018-06-06 DIAGNOSIS — M19012 Primary osteoarthritis, left shoulder: Secondary | ICD-10-CM | POA: Diagnosis not present

## 2018-06-13 DIAGNOSIS — F332 Major depressive disorder, recurrent severe without psychotic features: Secondary | ICD-10-CM | POA: Diagnosis not present

## 2018-06-13 DIAGNOSIS — R4586 Emotional lability: Secondary | ICD-10-CM | POA: Diagnosis not present

## 2018-06-20 ENCOUNTER — Ambulatory Visit (INDEPENDENT_AMBULATORY_CARE_PROVIDER_SITE_OTHER): Payer: Medicare Other | Admitting: Psychology

## 2018-06-20 DIAGNOSIS — F331 Major depressive disorder, recurrent, moderate: Secondary | ICD-10-CM

## 2018-06-26 ENCOUNTER — Ambulatory Visit: Payer: Self-pay | Admitting: Internal Medicine

## 2018-06-27 ENCOUNTER — Ambulatory Visit: Payer: Self-pay | Admitting: Internal Medicine

## 2018-07-03 ENCOUNTER — Ambulatory Visit: Payer: Self-pay | Admitting: Internal Medicine

## 2018-07-04 ENCOUNTER — Encounter: Payer: Self-pay | Admitting: Internal Medicine

## 2018-07-04 ENCOUNTER — Ambulatory Visit (INDEPENDENT_AMBULATORY_CARE_PROVIDER_SITE_OTHER): Payer: Medicare Other | Admitting: Internal Medicine

## 2018-07-04 DIAGNOSIS — N9089 Other specified noninflammatory disorders of vulva and perineum: Secondary | ICD-10-CM | POA: Insufficient documentation

## 2018-07-04 MED ORDER — CLOBETASOL PROPIONATE 0.05 % EX CREA: 1.0000 "application " | TOPICAL_CREAM | Freq: Two times a day (BID) | CUTANEOUS | 3 refills | Status: DC

## 2018-07-04 NOTE — Patient Instructions (Signed)
We have sent in cream to use twice a day while flared up and then once a day once clear for 2-3 months to see if we can get this to go away.

## 2018-07-04 NOTE — Assessment & Plan Note (Signed)
Suspect lichen sclerosis. Rx for clobetasol cream to use.

## 2018-07-04 NOTE — Progress Notes (Signed)
   Subjective:   Patient ID: Robin Valencia, female    DOB: 1945-08-23, 73 y.o.   MRN: [id number]  HPI The patient is a 73 YO female coming in for itching and redness vaginally and perineally. She thought that these were yeast infections and has been treating them with the diflucan we sent in. Has not taken this in several weeks at least. They will resolve for 3-4 days and then start again. She denies seeing anyone else for this previously. Denies pain and not typical for herpes outbreak.   Review of Systems  Constitutional: Negative.   HENT: Negative.   Eyes: Negative.   Respiratory: Negative for cough, chest tightness and shortness of breath.   Cardiovascular: Negative for chest pain, palpitations and leg swelling.  Gastrointestinal: Negative for abdominal distention, abdominal pain, constipation, diarrhea, nausea and vomiting.  Musculoskeletal: Negative.   Skin: Positive for rash.  Neurological: Negative.   Psychiatric/Behavioral: Negative.     Objective:  Physical Exam Constitutional:      Appearance: She is well-developed.  HENT:     Head: Normocephalic and atraumatic.  Neck:     Musculoskeletal: Normal range of motion.  Cardiovascular:     Rate and Rhythm: Normal rate and regular rhythm.  Pulmonary:     Effort: Pulmonary effort is normal.  Abdominal:     Palpations: Abdomen is soft.  Genitourinary:    Comments: Exam declined Skin:    General: Skin is warm and dry.  Neurological:     Mental Status: She is alert and oriented to person, place, and time.     Coordination: Coordination normal.     Vitals:   07/04/18 1035  BP: 122/74  Pulse: 80  Temp: 98.5 F (36.9 C)  TempSrc: Oral  SpO2: 96%  Weight: 162 lb (73.5 kg)  Height: 5\' 4"  (1.626 m)    Assessment & Plan:

## 2018-07-07 ENCOUNTER — Other Ambulatory Visit: Payer: Self-pay | Admitting: Adult Health

## 2018-07-07 ENCOUNTER — Telehealth: Payer: Self-pay | Admitting: Internal Medicine

## 2018-07-07 DIAGNOSIS — Z853 Personal history of malignant neoplasm of breast: Secondary | ICD-10-CM

## 2018-07-07 MED ORDER — TRIAMCINOLONE ACETONIDE 0.1 % EX CREA: 1.0000 "application " | TOPICAL_CREAM | Freq: Two times a day (BID) | CUTANEOUS | 3 refills | Status: DC

## 2018-07-07 NOTE — Telephone Encounter (Signed)
Copied from [place] [id number]. Topic: Quick Communication - Rx Refill/Question >> Jul 07, 2018  2:19 PM Margot Ables wrote: Medication: clobetasol cream (TEMOVATE) 0.05 %  - medication was $259 and pt cannot afford - please advise on less expensive alternative  Has the patient contacted their pharmacy? yes Preferred Pharmacy (with phone number or street name): [id number] - [place], [place] [phone] (Phone) [phone] (Fax)

## 2018-07-07 NOTE — Telephone Encounter (Signed)
LVM informing patient of MD response  

## 2018-07-07 NOTE — Telephone Encounter (Signed)
Sent in alternative although it may not work as well.

## 2018-07-11 DIAGNOSIS — M19012 Primary osteoarthritis, left shoulder: Secondary | ICD-10-CM | POA: Diagnosis not present

## 2018-09-12 DIAGNOSIS — R4586 Emotional lability: Secondary | ICD-10-CM | POA: Diagnosis not present

## 2018-09-12 DIAGNOSIS — F332 Major depressive disorder, recurrent severe without psychotic features: Secondary | ICD-10-CM | POA: Diagnosis not present

## 2018-12-05 DIAGNOSIS — M19012 Primary osteoarthritis, left shoulder: Secondary | ICD-10-CM | POA: Diagnosis not present

## 2018-12-09 ENCOUNTER — Telehealth: Payer: Self-pay | Admitting: Internal Medicine

## 2018-12-09 NOTE — Telephone Encounter (Signed)
Rec'd from [place] forwarded 2 pages to Dr. Pricilla Holm

## 2019-01-05 DIAGNOSIS — S93492A Sprain of other ligament of left ankle, initial encounter: Secondary | ICD-10-CM | POA: Diagnosis not present

## 2019-01-13 ENCOUNTER — Telehealth: Payer: Self-pay | Admitting: Internal Medicine

## 2019-01-13 NOTE — Telephone Encounter (Signed)
Rec'd from Raliegh Ip forwarded 2 pages to Dr. Pricilla Holm

## 2019-01-30 DIAGNOSIS — R4586 Emotional lability: Secondary | ICD-10-CM | POA: Diagnosis not present

## 2019-01-30 DIAGNOSIS — F332 Major depressive disorder, recurrent severe without psychotic features: Secondary | ICD-10-CM | POA: Diagnosis not present

## 2019-02-04 ENCOUNTER — Other Ambulatory Visit: Payer: Self-pay | Admitting: *Deleted

## 2019-02-04 DIAGNOSIS — C50411 Malignant neoplasm of upper-outer quadrant of right female breast: Secondary | ICD-10-CM

## 2019-02-04 DIAGNOSIS — Z171 Estrogen receptor negative status [ER-]: Secondary | ICD-10-CM

## 2019-02-04 NOTE — Progress Notes (Signed)
No show

## 2019-02-05 ENCOUNTER — Inpatient Hospital Stay: Payer: Medicare Other | Attending: Oncology

## 2019-02-05 ENCOUNTER — Encounter: Payer: Self-pay | Admitting: Oncology

## 2019-02-05 ENCOUNTER — Inpatient Hospital Stay (HOSPITAL_BASED_OUTPATIENT_CLINIC_OR_DEPARTMENT_OTHER): Payer: Medicare Other | Admitting: Oncology

## 2019-02-05 DIAGNOSIS — C50411 Malignant neoplasm of upper-outer quadrant of right female breast: Secondary | ICD-10-CM

## 2019-02-05 DIAGNOSIS — Z171 Estrogen receptor negative status [ER-]: Secondary | ICD-10-CM

## 2019-02-12 ENCOUNTER — Telehealth: Payer: Self-pay | Admitting: Oncology

## 2019-02-12 NOTE — Telephone Encounter (Signed)
Returned patient's phone call regarding rescheduling missed 10/01 appointment, per patient's request appointment has moved to 11/10.

## 2019-03-05 ENCOUNTER — Ambulatory Visit: Payer: Medicare Other

## 2019-03-10 ENCOUNTER — Ambulatory Visit: Payer: Self-pay

## 2019-03-12 DIAGNOSIS — F3341 Major depressive disorder, recurrent, in partial remission: Secondary | ICD-10-CM | POA: Diagnosis not present

## 2019-03-16 NOTE — Progress Notes (Signed)
[facility]  [phone] [phone]     ID: Robin Valencia DOB: November 13, 1945  MR#: [id number]  [id number]  Patient Care Team: Hoyt Koch, MD as PCP - General (Internal Medicine) Magrinat, Virgie Dad, MD as Consulting Physician (Oncology) Marchia Bond, MD as Consulting Physician (Orthopedic Surgery) Daron Offer, Richard Miu, MD as Consulting Physician (Psychiatry) OTHER MD: Loyal Jacobson MD  CHIEF COMPLAINT: Triple negative breast cancer  CURRENT TREATMENT: Observation   INTERVAL HISTORY: [name] returns today for follow-up of her estrogen receptor negative breast cancer. She continues under observation alone.  Since her last visit, she has not undergone any additional studies. Her most recent mammogram was performed in 04/2017 at [facility].   REVIEW OF SYSTEMS: Robin Valencia tells me she has developed psoriasis.  She has not yet seen a dermatologist that is being scheduled with 1 to [facility] dermatology.  She has significant left shoulder pain and has been told she needs surgery.  She is reluctant to undergo that.  For exercise she walks her 2 dogs which takes her about 20 minutes a day.  Aside from these issues a detailed review of systems today was stable   BREAST CANCER HISTORY: From the original intake note:  Robin Valencia tells me she palpated a change in her right breast January 2015. It was "very high, almost outside the breast". She contacted her primary care physician, underwent mammography and ultrasonography, (I do not have those records) and on 06/03/2013 underwent biopsy of a mass described as "T2 NX) in the upper outer quadrant of the right breast. The pathology from that procedure [facility] [id number]) showed an invasive ductal carcinoma, grade 3, triple negative, with an MIB-1 of 91%.  The patient was treated neoadjuvantly with cyclophosphamide and doxorubicin between 07/23/2013 and 09/30/2013. There were no dose reductions, but there were  delays between the second and third cycle (4 weeks (and the third and fourth (3 weeks). This was followed by paclitaxel weekly 12, with no treatment delays, but dose reduction of 20% with the final 4 doses. This was completed 01/14/2014.  On 02/19/2014 the patient underwent right mastectomy and sentinel lymph node sampling, with the final pathology ([id number]) showing a complete pathologic response in the breast (no invasive disease and no DCIS) and all 4 sentinel lymph nodes clear. The patient then proceeded to adjuvant radiation. I do not have the radiation records.  Her subsequent history is as detailed below   PAST MEDICAL HISTORY: Past Medical History:  Diagnosis Date  . Arthritis   . Breast cancer of upper-outer quadrant of right female breast ([place])   . Depression   . Eating disorder   . History of stomach ulcers   . Hypothyroidism   . Jaundice   . Pancreatitis 2009  . Personal history of chemotherapy   . Personal history of radiation therapy   . Reflux esophagitis     PAST SURGICAL HISTORY: Past Surgical History:  Procedure Laterality Date  . BREAST LUMPECTOMY    . BREAST SURGERY    . CESAREAN SECTION    . CESAREAN SECTION WITH BILATERAL TUBAL LIGATION    . CHOLECYSTECTOMY    . EXPLORATORY LAPAROTOMY    . MASTECTOMY Right   . THYROIDECTOMY    . TONSILLECTOMY      FAMILY HISTORY Family History  Problem Relation Age of Onset  . Arthritis Mother   . Mental illness Mother   . Depression Mother   . Alcohol abuse Father   . Hyperlipidemia Father   .  Heart disease Father   . Stroke Father   . Hypertension Father   . Hypertension Sister   . Mental illness Sister   . Breast cancer Sister 69  . Mental illness Brother   . Depression Brother   . Cancer Paternal Grandfather        prostate  . Breast cancer Maternal Aunt    The patient's father died from a stroke at the age of 55. The patient's mother died at the age of 59. The patient had one brother, 2 sisters.  One sister was diagnosed with breast cancer in her 54s. One maternal aunt was diagnosed with breast cancer in her 4s. There is no history of ovarian cancer in the family.   GYNECOLOGIC HISTORY:  No LMP recorded. Patient is postmenopausal. Menarche age 61, first live birth age 34, which the patient understands increases the risk of breast cancer. She went through the change of life in 1989, and used hormone replacement for approximately 10 years, which also increases the risk of breast cancer developing. 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 She is divorced, and lives by herself with 2 [name]. Her son Robin Valencia is a foreign service operative currently in [place]. He is married. The patient has 2 grandchildren. She attends a [employer].    ADVANCED DIRECTIVES: In place. The patient's son is her healthcare power of attorney   HEALTH MAINTENANCE: Social History   Tobacco Use  . Smoking status: Never Smoker  . Smokeless tobacco: Never Used  Substance Use Topics  . Alcohol use: No  . Drug use: No     Colonoscopy:  PAP:  Bone density:  Lipid panel:  Allergies  Allergen Reactions  . Codeine Nausea And Vomiting  . Gabapentin Nausea And Vomiting  . Sulfa Antibiotics Diarrhea    Current Outpatient Medications  Medication Sig Dispense Refill  . Brexpiprazole (REXULTI) 0.25 MG TABS Take by mouth. 60 tablet   . buPROPion (WELLBUTRIN XL) 300 MG 24 hr tablet Take 1 tablet (300 mg total) by mouth every morning. Take with 150 mg for a total of 450 mg daily 90 tablet 3  . citalopram (CELEXA) 40 MG tablet Take 1 tablet (40 mg total) by mouth daily. 90 tablet 3  . levothyroxine (SYNTHROID, LEVOTHROID) 50 MCG tablet TAKE ONE TABLET BY MOUTH DAILY BEFORE BREAKFAST. 90 tablet 3  . lidocaine (XYLOCAINE) 2 %  solution Use as directed 15 mLs in the mouth or throat as needed for mouth pain. 100 mL 3  . meloxicam (MOBIC) 7.5 MG tablet Take 7.5 mg by mouth daily.    . methylphenidate (RITALIN) 5 MG tablet Take 1 tablet (5 mg total) by mouth 2 (two) times daily with breakfast and lunch. 60 tablet 0  . montelukast (SINGULAIR) 10 MG tablet Take 1 tablet (10 mg total) by mouth at bedtime. 30 tablet 3  . nystatin-triamcinolone ointment (MYCOLOG) Apply 1 application topically 2 (two) times daily. 60 g 0  . omeprazole (PRILOSEC) 20 MG capsule Take 1 capsule (20 mg total) by mouth 2 (two) times daily. 180 capsule 3  . triamcinolone cream (KENALOG) 0.1 % Apply 1 application topically 2 (two) times daily. 100 g 3  . valACYclovir (VALTREX) 1000 MG tablet Take 1 tablet (1,000 mg total) by mouth 2 (two) times daily. 30 tablet 0  . vitamin  B-12 (CYANOCOBALAMIN) 100 MCG tablet Take 100 mcg by mouth daily.     No current facility-administered medications for this visit.     OBJECTIVE: Middle-aged white woman who appears stated age  32:   03/17/19 1427  BP: 123/66  Pulse: 79  Resp: 16  Temp: 98.3 F (36.8 C)  SpO2: 97%     Body mass index is 25.58 kg/m.    ECOG FS:1 - Symptomatic but completely ambulatory  Sclerae unicteric, EOMs intact Wearing a mask No cervical or supraclavicular adenopathy Lungs no rales or rhonchi Heart regular rate and rhythm Abd soft, nontender, positive bowel sounds MSK no focal spinal tenderness, no upper extremity lymphedema Neuro: nonfocal, well oriented, appropriate affect Breasts: The right breast has undergone lumpectomy and radiation.  There is no evidence of local recurrence.  Left breast is benign.  Both axillae are benign.   LAB RESULTS:  CMP     Component Value Date/Time   NA 141 03/17/2019 1410   NA 144 08/01/2015 1013   K 4.0 03/17/2019 1410   K 3.9 08/01/2015 1013   CL 108 03/17/2019 1410   CO2 24 03/17/2019 1410   CO2 25 08/01/2015 1013   GLUCOSE 90  03/17/2019 1410   GLUCOSE 95 08/01/2015 1013   BUN 22 03/17/2019 1410   BUN 21.6 08/01/2015 1013   CREATININE 0.98 03/17/2019 1410   CREATININE 0.8 08/01/2015 1013   CALCIUM 9.2 03/17/2019 1410   CALCIUM 8.6 08/01/2015 1013   PROT 6.8 03/17/2019 1410   PROT 6.3 (L) 08/01/2015 1013   ALBUMIN 4.2 03/17/2019 1410   ALBUMIN 3.6 08/01/2015 1013   AST 18 03/17/2019 1410   AST 25 08/01/2015 1013   ALT 18 03/17/2019 1410   ALT 23 08/01/2015 1013   ALKPHOS 90 03/17/2019 1410   ALKPHOS 96 08/01/2015 1013   BILITOT 0.7 03/17/2019 1410   BILITOT 0.67 08/01/2015 1013   GFRNONAA 58 (L) 03/17/2019 1410   GFRAA >60 03/17/2019 1410    INo results found for: SPEP, UPEP  Lab Results  Component Value Date   WBC 6.2 03/17/2019   NEUTROABS 3.5 03/17/2019   HGB 14.0 03/17/2019   HCT 40.0 03/17/2019   MCV 93.9 03/17/2019   PLT 213 03/17/2019      Chemistry      Component Value Date/Time   NA 141 03/17/2019 1410   NA 144 08/01/2015 1013   K 4.0 03/17/2019 1410   K 3.9 08/01/2015 1013   CL 108 03/17/2019 1410   CO2 24 03/17/2019 1410   CO2 25 08/01/2015 1013   BUN 22 03/17/2019 1410   BUN 21.6 08/01/2015 1013   CREATININE 0.98 03/17/2019 1410   CREATININE 0.8 08/01/2015 1013      Component Value Date/Time   CALCIUM 9.2 03/17/2019 1410   CALCIUM 8.6 08/01/2015 1013   ALKPHOS 90 03/17/2019 1410   ALKPHOS 96 08/01/2015 1013   AST 18 03/17/2019 1410   AST 25 08/01/2015 1013   ALT 18 03/17/2019 1410   ALT 23 08/01/2015 1013   BILITOT 0.7 03/17/2019 1410   BILITOT 0.67 08/01/2015 1013       Lab Results  Component Value Date   LABCA2 30 04/07/2015    No components found for: [id number]  No results for input(s): INR in the last 168 hours.  Urinalysis    Component Value Date/Time   COLORURINE YELLOW 05/05/2015 0154   APPEARANCEUR CLOUDY (A) 05/05/2015 0154   LABSPEC 1.023 05/05/2015 0154   PHURINE 6.0  05/05/2015 0154   GLUCOSEU NEGATIVE 05/05/2015 0154   HGBUR TRACE (A)  05/05/2015 0154   BILIRUBINUR neg 11/19/2016 1013   KETONESUR 15 (A) 05/05/2015 0154   PROTEINUR neg 11/19/2016 1013   PROTEINUR NEGATIVE 05/05/2015 0154   UROBILINOGEN 0.2 11/19/2016 1013   NITRITE neg 11/19/2016 1013   NITRITE NEGATIVE 05/05/2015 0154   LEUKOCYTESUR Large (3+) (A) 11/19/2016 1013    STUDIES: No results found.   ASSESSMENT: 73 y.o. [place] woman status post right breast upper outer quadrant biopsy 06/04/2013 for a clinical T2 N0, stage IIA invasive ductal carcinoma, triple negative, with an MIB-1 of 91%  (1) neoadjuvant chemotherapy consisted of doxorubicin and cyclophosphamide 4 followed by weekly Taxol 12  (a) some treatment delays with doxorubicin/cyclophosphamide cycles  (b) 20% dose reduction final for Taxol cycles  (2) status post right lumpectomy and sentinel lymph node sampling 02/19/2014 showing a complete pathologic response (ypT0, ypN0)  (3) status post adjuvant radiation  (4) genetics counseling considered--low risk for family mutation to be present  PLAN:  Robin Valencia is now just over 5 years out from definitive surgery for her breast cancer with no evidence of disease recurrence.  This is very favorable.  She is behind on mammography and I have added the order for her to receive it before the end of the year.  She understands from this point she will receive screening mammograms.  At this point I am comfortable releasing her to her primary care physician.  All she will need in terms of breast cancer follow-up is her yearly mammography and a yearly physician breast exam.  I will be glad to see May again at any point in the future if and when the need arises but as of now are making no further routine appointments for her here.  Magrinat, Virgie Dad, MD  03/17/19 5:31 PM Medical Oncology and Hematology [facility] [place], [place] [address] Tel. [phone]    Fax. [phone]   I, Wilburn Mylar, am acting as  scribe for Dr. Virgie Dad. Magrinat.  I, Lurline Del MD, have reviewed the above documentation for accuracy and completeness, and I agree with the above.

## 2019-03-17 ENCOUNTER — Other Ambulatory Visit: Payer: Self-pay

## 2019-03-17 ENCOUNTER — Inpatient Hospital Stay (HOSPITAL_BASED_OUTPATIENT_CLINIC_OR_DEPARTMENT_OTHER): Payer: Medicare Other | Admitting: Oncology

## 2019-03-17 ENCOUNTER — Inpatient Hospital Stay: Payer: Medicare Other | Attending: Oncology

## 2019-03-17 VITALS — BP 123/66 | HR 79 | Temp 98.3°F | Resp 16 | Ht 64.0 in | Wt 149.0 lb

## 2019-03-17 DIAGNOSIS — Z888 Allergy status to other drugs, medicaments and biological substances status: Secondary | ICD-10-CM | POA: Insufficient documentation

## 2019-03-17 DIAGNOSIS — Z803 Family history of malignant neoplasm of breast: Secondary | ICD-10-CM | POA: Insufficient documentation

## 2019-03-17 DIAGNOSIS — Z9011 Acquired absence of right breast and nipple: Secondary | ICD-10-CM | POA: Insufficient documentation

## 2019-03-17 DIAGNOSIS — Z885 Allergy status to narcotic agent status: Secondary | ICD-10-CM | POA: Diagnosis not present

## 2019-03-17 DIAGNOSIS — Z171 Estrogen receptor negative status [ER-]: Secondary | ICD-10-CM | POA: Diagnosis not present

## 2019-03-17 DIAGNOSIS — Z8042 Family history of malignant neoplasm of prostate: Secondary | ICD-10-CM | POA: Insufficient documentation

## 2019-03-17 DIAGNOSIS — Z8261 Family history of arthritis: Secondary | ICD-10-CM | POA: Insufficient documentation

## 2019-03-17 DIAGNOSIS — Z811 Family history of alcohol abuse and dependence: Secondary | ICD-10-CM | POA: Insufficient documentation

## 2019-03-17 DIAGNOSIS — Z882 Allergy status to sulfonamides status: Secondary | ICD-10-CM | POA: Insufficient documentation

## 2019-03-17 DIAGNOSIS — E039 Hypothyroidism, unspecified: Secondary | ICD-10-CM | POA: Insufficient documentation

## 2019-03-17 DIAGNOSIS — Z8249 Family history of ischemic heart disease and other diseases of the circulatory system: Secondary | ICD-10-CM | POA: Insufficient documentation

## 2019-03-17 DIAGNOSIS — Z818 Family history of other mental and behavioral disorders: Secondary | ICD-10-CM | POA: Diagnosis not present

## 2019-03-17 DIAGNOSIS — Z83438 Family history of other disorder of lipoprotein metabolism and other lipidemia: Secondary | ICD-10-CM | POA: Insufficient documentation

## 2019-03-17 DIAGNOSIS — Z823 Family history of stroke: Secondary | ICD-10-CM | POA: Diagnosis not present

## 2019-03-17 DIAGNOSIS — C50411 Malignant neoplasm of upper-outer quadrant of right female breast: Secondary | ICD-10-CM | POA: Diagnosis not present

## 2019-03-17 DIAGNOSIS — Z79899 Other long term (current) drug therapy: Secondary | ICD-10-CM | POA: Insufficient documentation

## 2019-03-17 LAB — CBC WITH DIFFERENTIAL (CANCER CENTER ONLY)
Abs Immature Granulocytes: 0.01 10*3/uL (ref 0.00–0.07)
Basophils Absolute: 0 10*3/uL (ref 0.0–0.1)
Basophils Relative: 1 %
Eosinophils Absolute: 0.1 10*3/uL (ref 0.0–0.5)
Eosinophils Relative: 2 %
HCT: 40 % (ref 36.0–46.0)
Hemoglobin: 14 g/dL (ref 12.0–15.0)
Immature Granulocytes: 0 %
Lymphocytes Relative: 30 %
Lymphs Abs: 1.8 10*3/uL (ref 0.7–4.0)
MCH: 32.9 pg (ref 26.0–34.0)
MCHC: 35 g/dL (ref 30.0–36.0)
MCV: 93.9 fL (ref 80.0–100.0)
Monocytes Absolute: 0.7 10*3/uL (ref 0.1–1.0)
Monocytes Relative: 11 %
Neutro Abs: 3.5 10*3/uL (ref 1.7–7.7)
Neutrophils Relative %: 56 %
Platelet Count: 213 10*3/uL (ref 150–400)
RBC: 4.26 MIL/uL (ref 3.87–5.11)
RDW: 13 % (ref 11.5–15.5)
WBC Count: 6.2 10*3/uL (ref 4.0–10.5)
nRBC: 0 % (ref 0.0–0.2)

## 2019-03-17 LAB — CMP (CANCER CENTER ONLY)
ALT: 18 U/L (ref 0–44)
AST: 18 U/L (ref 15–41)
Albumin: 4.2 g/dL (ref 3.5–5.0)
Alkaline Phosphatase: 90 U/L (ref 38–126)
Anion gap: 9 (ref 5–15)
BUN: 22 mg/dL (ref 8–23)
CO2: 24 mmol/L (ref 22–32)
Calcium: 9.2 mg/dL (ref 8.9–10.3)
Chloride: 108 mmol/L (ref 98–111)
Creatinine: 0.98 mg/dL (ref 0.44–1.00)
GFR, Est AFR Am: >60 mL/min (ref >60)
GFR, Estimated: 58 mL/min — ABNORMAL LOW (ref >60)
Glucose, Bld: 90 mg/dL (ref 70–99)
Potassium: 4 mmol/L (ref 3.5–5.1)
Sodium: 141 mmol/L (ref 135–145)
Total Bilirubin: 0.7 mg/dL (ref 0.3–1.2)
Total Protein: 6.8 g/dL (ref 6.5–8.1)

## 2019-03-23 DIAGNOSIS — L308 Other specified dermatitis: Secondary | ICD-10-CM | POA: Diagnosis not present

## 2019-03-27 DIAGNOSIS — M19012 Primary osteoarthritis, left shoulder: Secondary | ICD-10-CM | POA: Diagnosis not present

## 2019-03-31 ENCOUNTER — Ambulatory Visit (INDEPENDENT_AMBULATORY_CARE_PROVIDER_SITE_OTHER): Payer: Medicare Other | Admitting: *Deleted

## 2019-03-31 DIAGNOSIS — Z1211 Encounter for screening for malignant neoplasm of colon: Secondary | ICD-10-CM | POA: Diagnosis not present

## 2019-03-31 DIAGNOSIS — Z0000 Encounter for general adult medical examination without abnormal findings: Secondary | ICD-10-CM

## 2019-03-31 NOTE — Progress Notes (Addendum)
 Subjective:   Robin Valencia is a 73 y.o. female who presents for Medicare Annual (Subsequent) preventive examination. I connected with patient by a telephone and verified that I am speaking with the correct person using two identifiers. Patient stated full name and DOB. Patient gave permission to continue with telephonic visit. Patient's location was at home and Nurse's location was at [place] office. Participants during this visit included patient and nurse.  Review of Systems:   Cardiac Risk Factors include: advanced age (>55men, >65 women) Sleep patterns: feels rested on waking, gets up 1 times nightly to void and sleeps 7-8 hours nightly.    Home Safety/Smoke Alarms: Feels safe in home. Smoke alarms in place.  Living environment; residence and Firearm Safety: 1-story house/ trailer. Lives alone, no needs for DME, good support system Seat Belt Safety/Bike Helmet: Wears seat belt.     Objective:     Vitals: There were no vitals taken for this visit.  There is no height or weight on file to calculate BMI.  Advanced Directives 03/31/2019 08/08/2015 05/04/2015 04/08/2015 04/07/2015  Does Patient Have a Medical Advance Directive? No Yes No Yes Yes  Type of Advance Directive - Healthcare Power of Attorney - Healthcare Power of Attorney -  Copy of Healthcare Power of Attorney in Chart? - - - - No - copy requested  Would patient like information on creating a medical advance directive? Yes (MAU/Ambulatory/Procedural Areas - Information given) - - - -  Some encounter information is confidential and restricted. Go to Review Flowsheets activity to see all data.    Tobacco Social History   Tobacco Use  Smoking Status Never Smoker  Smokeless Tobacco Never Used     Counseling given: Not Answered  Past Medical History:  Diagnosis Date  . Arthritis   . Breast cancer of upper-outer quadrant of right female breast (HCC)   . Depression   . Eating disorder   . History of stomach ulcers   .  Hypothyroidism   . Jaundice   . Pancreatitis 2009  . Personal history of chemotherapy   . Personal history of radiation therapy   . Reflux esophagitis    Past Surgical History:  Procedure Laterality Date  . BREAST LUMPECTOMY    . BREAST SURGERY    . CESAREAN SECTION    . CESAREAN SECTION WITH BILATERAL TUBAL LIGATION    . CHOLECYSTECTOMY    . EXPLORATORY LAPAROTOMY     pancreatits due to gallbladder surgery  . MASTECTOMY Right   . THYROIDECTOMY    . TONSILLECTOMY     Family History  Problem Relation Age of Onset  . Arthritis Mother   . Mental illness Mother   . Depression Mother   . Alcohol abuse Father   . Hyperlipidemia Father   . Heart disease Father   . Stroke Father   . Hypertension Father   . Hypertension Sister   . Mental illness Sister   . Breast cancer Sister 68  . Mental illness Brother   . Depression Brother   . Cancer Paternal Grandfather        prostate  . Breast cancer Maternal Aunt    Social History   Socioeconomic History  . Marital status: Divorced    Spouse name: Not on file  . Number of children: 1  . Years of education: Not on file  . Highest education level: Not on file  Occupational History  . Occupation: Law [employer]  Social Needs  . Financial resource strain: Not   hard at all  . Food insecurity    Worry: Never true    Inability: Never true  . Transportation needs    Medical: No    Non-medical: No  Tobacco Use  . Smoking status: Never Smoker  . Smokeless tobacco: Never Used  Substance and Sexual Activity  . Alcohol use: No  . Drug use: No  . Sexual activity: Never  Lifestyle  . Physical activity    Days per week: 0 days    Minutes per session: 0 min  . Stress: Not at all  Relationships  . Social connections    Talks on phone: More than three times a week    Gets together: More than three times a week    Attends religious service: Not on file    Active member of club or organization: Not on file    Attends meetings of clubs  or organizations: Not on file    Relationship status: Divorced  Other Topics Concern  . Not on file  Social History Narrative  . Not on file    Outpatient Encounter Medications as of 03/31/2019  Medication Sig  . acetaminophen (TYLENOL) 500 MG tablet Take 500 mg by mouth every 6 (six) hours as needed.  . citalopram (CELEXA) 40 MG tablet Take 1 tablet (40 mg total) by mouth daily.  . levothyroxine (SYNTHROID, LEVOTHROID) 50 MCG tablet TAKE ONE TABLET BY MOUTH DAILY BEFORE BREAKFAST.  . meloxicam (MOBIC) 7.5 MG tablet Take 7.5 mg by mouth daily.  . omeprazole (PRILOSEC) 20 MG capsule Take 1 capsule (20 mg total) by mouth 2 (two) times daily.  . triamcinolone cream (KENALOG) 0.1 % Apply 1 application topically 2 (two) times daily.  . vitamin B-12 (CYANOCOBALAMIN) 100 MCG tablet Take 100 mcg by mouth daily.  . buPROPion (WELLBUTRIN XL) 300 MG 24 hr tablet Take 1 tablet (300 mg total) by mouth every morning. Take with 150 mg for a total of 450 mg daily  . [DISCONTINUED] Brexpiprazole (REXULTI) 0.25 MG TABS Take by mouth.  . [DISCONTINUED] lidocaine (XYLOCAINE) 2 % solution Use as directed 15 mLs in the mouth or throat as needed for mouth pain. (Patient not taking: Reported on 03/31/2019)  . [DISCONTINUED] methylphenidate (RITALIN) 5 MG tablet Take 1 tablet (5 mg total) by mouth 2 (two) times daily with breakfast and lunch.  . [DISCONTINUED] montelukast (SINGULAIR) 10 MG tablet Take 1 tablet (10 mg total) by mouth at bedtime. (Patient not taking: Reported on 03/31/2019)  . [DISCONTINUED] nystatin-triamcinolone ointment (MYCOLOG) Apply 1 application topically 2 (two) times daily. (Patient not taking: Reported on 03/31/2019)  . [DISCONTINUED] valACYclovir (VALTREX) 1000 MG tablet Take 1 tablet (1,000 mg total) by mouth 2 (two) times daily. (Patient not taking: Reported on 03/31/2019)   No facility-administered encounter medications on file as of 03/31/2019.     Activities of Daily Living In your  present state of health, do you have any difficulty performing the following activities: 03/31/2019  Hearing? N  Vision? N  Difficulty concentrating or making decisions? N  Walking or climbing stairs? N  Dressing or bathing? N  Doing errands, shopping? N  Preparing Food and eating ? N  Using the Toilet? N  In the past six months, have you accidently leaked urine? N  Do you have problems with loss of bowel control? N  Managing your Medications? N  Managing your Finances? N  Housekeeping or managing your Housekeeping? N  Some recent data might be hidden    Patient Care   Team: Hoyt Koch, MD as PCP - General (Internal Medicine) Magrinat, Virgie Dad, MD as Consulting Physician (Oncology) Marchia Bond, MD as Consulting Physician (Orthopedic Surgery) Daron Offer Richard Miu, MD as Consulting Physician (Psychiatry)    Assessment:   This is a routine wellness examination for Tajia. Physical assessment deferred to PCP.  Exercise Activities and Dietary recommendations Current Exercise Habits: The patient does not participate in regular exercise at present, Exercise limited by: orthopedic condition(s)  Diet (meal preparation, eat out, water intake, caffeinated beverages, dairy products, fruits and vegetables): in general, a "healthy" diet  , well balanced   Reviewed heart healthy diet. Encouraged patient to increase daily water and healthy fluid intake.   Goals    . Patient Stated     Maintain current health status        Fall Risk Fall Risk  03/31/2019 05/27/2018 10/25/2015  Falls in the past year? 0 1 No  Number falls in past yr: 0 0 -  Injury with Fall? 0 0 -   Is the patient's home free of loose throw rugs in walkways, pet beds, electrical cords, etc?   yes      Grab bars in the bathroom? no      Handrails on the stairs?   yes      Adequate lighting?   yes  Depression Screen PHQ 2/9 Scores 03/31/2019 05/27/2018 10/25/2015 10/25/2015  PHQ - 2 Score _0 0  PHQ- 9  Score _1 -     Cognitive Function       Ad8 score reviewed for issues:  Issues making decisions: no  Less interest in hobbies / activities: no  Repeats questions, stories (family complaining): no  Trouble using ordinary gadgets (microwave, computer, phone):no  Forgets the month or year: no  Mismanaging finances: no  Remembering appts: no  Daily problems with thinking and/or memory: no Ad8 score is= 0  Immunization History  Administered Date(s) Administered  . Influenza, High Dose Seasonal PF 04/26/2017, 05/27/2018    Screening Tests Health Maintenance  Topic Date Due  . COLONOSCOPY  06/27/1997  . INFLUENZA VACCINE  12/06/2018  . TETANUS/TDAP  05/28/2019 (Originally 06/27/1966)  . Hepatitis C Screening  05/28/2019 (Originally [birth year])  . PNA vac Low Risk Adult (1 of 2 - PCV13) 05/28/2019 (Originally 06/27/2012)  . DEXA SCAN  03/30/2020 (Originally 06/27/2012)  . MAMMOGRAM  04/19/2019      Plan:     Reviewed health maintenance screenings with patient today and relevant education, vaccines, and/or referrals were provided.  Nurse visit for flu vaccine was scheduled and cologuard kit was ordered for colon screening.   Continue to eat heart healthy diet (full of fruits, vegetables, whole grains, lean protein, water--limit salt, fat, and sugar intake) and increase physical activity as tolerated.  Continue doing brain stimulating activities (puzzles, reading, adult coloring books, staying active) to keep memory sharp.   I have personally reviewed and noted the following in the patient's chart:   . Medical and social history . Use of alcohol, tobacco or illicit drugs  . Current medications and supplements . Functional ability and status . Nutritional status . Physical activity . Advanced directives . List of other physicians . Vitals . Screenings to include cognitive, depression, and falls . Referrals and appointments  In addition, I have reviewed and  discussed with patient certain preventive protocols, quality metrics, and best practice recommendations. A written personalized care plan for preventive services as well as general preventive health recommendations  were provided to patient.     Michiel Cowboy, RN  03/31/2019

## 2019-03-31 NOTE — Progress Notes (Signed)
Medical screening examination/treatment/procedure(s) were performed by non-physician practitioner and as supervising physician I was immediately available for consultation/collaboration. I agree with above. Sasuke Yaffe A Esma Kilts, MD 

## 2019-04-10 ENCOUNTER — Ambulatory Visit: Payer: Self-pay

## 2019-04-17 DIAGNOSIS — Z79899 Other long term (current) drug therapy: Secondary | ICD-10-CM | POA: Diagnosis not present

## 2019-04-29 ENCOUNTER — Other Ambulatory Visit: Payer: Self-pay | Admitting: Internal Medicine

## 2019-05-15 DIAGNOSIS — F3341 Major depressive disorder, recurrent, in partial remission: Secondary | ICD-10-CM | POA: Diagnosis not present

## 2019-05-16 ENCOUNTER — Other Ambulatory Visit: Payer: Self-pay | Admitting: Internal Medicine

## 2019-05-16 DIAGNOSIS — E890 Postprocedural hypothyroidism: Secondary | ICD-10-CM

## 2019-05-22 ENCOUNTER — Other Ambulatory Visit: Payer: Self-pay | Admitting: Orthopedic Surgery

## 2019-05-22 DIAGNOSIS — M25512 Pain in left shoulder: Secondary | ICD-10-CM

## 2019-05-22 DIAGNOSIS — M19012 Primary osteoarthritis, left shoulder: Secondary | ICD-10-CM | POA: Diagnosis not present

## 2019-05-26 ENCOUNTER — Ambulatory Visit
Admission: RE | Admit: 2019-05-26 | Discharge: 2019-05-26 | Disposition: A | Discharge status: Home / Self Care | Payer: Medicare Other | Source: Ambulatory Visit | Attending: Orthopedic Surgery | Admitting: Orthopedic Surgery

## 2019-05-26 DIAGNOSIS — M25512 Pain in left shoulder: Secondary | ICD-10-CM

## 2019-05-26 DIAGNOSIS — M19012 Primary osteoarthritis, left shoulder: Secondary | ICD-10-CM | POA: Diagnosis not present

## 2019-06-02 ENCOUNTER — Other Ambulatory Visit: Payer: Self-pay

## 2019-06-02 ENCOUNTER — Ambulatory Visit (INDEPENDENT_AMBULATORY_CARE_PROVIDER_SITE_OTHER): Payer: Medicare Other | Admitting: Internal Medicine

## 2019-06-02 ENCOUNTER — Encounter: Payer: Self-pay | Admitting: Internal Medicine

## 2019-06-02 VITALS — BP 136/84 | HR 93 | Temp 98.2°F | Ht 64.0 in | Wt 149.0 lb

## 2019-06-02 DIAGNOSIS — Z01810 Encounter for preprocedural cardiovascular examination: Secondary | ICD-10-CM

## 2019-06-02 DIAGNOSIS — Z13220 Encounter for screening for lipoid disorders: Secondary | ICD-10-CM

## 2019-06-02 DIAGNOSIS — E890 Postprocedural hypothyroidism: Secondary | ICD-10-CM | POA: Diagnosis not present

## 2019-06-02 DIAGNOSIS — Z23 Encounter for immunization: Secondary | ICD-10-CM | POA: Diagnosis not present

## 2019-06-02 LAB — CBC
HCT: 43.3 % (ref 36.0–46.0)
Hemoglobin: 14.6 g/dL (ref 12.0–15.0)
MCHC: 33.8 g/dL (ref 30.0–36.0)
MCV: 97.6 fl (ref 78.0–100.0)
Platelets: 235 10*3/uL (ref 150.0–400.0)
RBC: 4.44 Mil/uL (ref 3.87–5.11)
RDW: 12.9 % (ref 11.5–15.5)
WBC: 5.4 10*3/uL (ref 4.0–10.5)

## 2019-06-02 LAB — LIPID PANEL
Cholesterol: 249 mg/dL — ABNORMAL HIGH (ref 0–200)
HDL: 59.7 mg/dL (ref >39.00)
LDL Cholesterol: 160 mg/dL — ABNORMAL HIGH (ref 0–99)
NonHDL: 189.69
Total CHOL/HDL Ratio: 4
Triglycerides: 146 mg/dL (ref 0.0–149.0)
VLDL: 29.2 mg/dL (ref 0.0–40.0)

## 2019-06-02 LAB — COMPREHENSIVE METABOLIC PANEL
ALT: 18 U/L (ref 0–35)
AST: 18 U/L (ref 0–37)
Albumin: 4.4 g/dL (ref 3.5–5.2)
Alkaline Phosphatase: 97 U/L (ref 39–117)
BUN: 19 mg/dL (ref 6–23)
CO2: 26 mEq/L (ref 19–32)
Calcium: 9.5 mg/dL (ref 8.4–10.5)
Chloride: 105 mEq/L (ref 96–112)
Creatinine, Ser: 0.93 mg/dL (ref 0.40–1.20)
GFR: 59.27 mL/min — ABNORMAL LOW (ref >60.00)
Glucose, Bld: 95 mg/dL (ref 70–99)
Potassium: 4.1 mEq/L (ref 3.5–5.1)
Sodium: 141 mEq/L (ref 135–145)
Total Bilirubin: 0.7 mg/dL (ref 0.2–1.2)
Total Protein: 6.8 g/dL (ref 6.0–8.3)

## 2019-06-02 LAB — T4, FREE: Free T4: 0.68 ng/dL (ref 0.60–1.60)

## 2019-06-02 LAB — TSH: TSH: 3.05 u[IU]/mL (ref 0.35–4.50)

## 2019-06-02 NOTE — Patient Instructions (Signed)
You are cleared for the surgery.

## 2019-06-02 NOTE — Progress Notes (Signed)
   Subjective:   Patient ID: Robin Valencia, female    DOB: Apr 29, 1946, 74 y.o.   MRN: [id number]  HPI The patient is a 74 YO female coming in for pre-op clearance. She is supposed to be getting left shoulder surgery. She is a non-smoker. BP at goal and she does not take blood pressure medication. Her arm is hurting so much that she is unable to get good rest and it limits some activities. She is able to still walk her dog without chest pains or SOB. Denies new chest pains or SOB or abdominal pain. Denies N/V/D.   PMH, [facility], social history reviewed and updated  Review of Systems  Constitutional: Negative.   HENT: Negative.   Eyes: Negative.   Respiratory: Negative for cough, chest tightness and shortness of breath.   Cardiovascular: Negative for chest pain, palpitations and leg swelling.  Gastrointestinal: Negative for abdominal distention, abdominal pain, constipation, diarrhea, nausea and vomiting.  Musculoskeletal: Positive for arthralgias and myalgias.  Skin: Negative.   Neurological: Negative.   Psychiatric/Behavioral: Negative.     Objective:  Physical Exam Constitutional:      Appearance: She is well-developed.  HENT:     Head: Normocephalic and atraumatic.  Cardiovascular:     Rate and Rhythm: Normal rate and regular rhythm.  Pulmonary:     Effort: Pulmonary effort is normal. No respiratory distress.     Breath sounds: Normal breath sounds. No wheezing or rales.  Abdominal:     General: Bowel sounds are normal. There is no distension.     Palpations: Abdomen is soft.     Tenderness: There is no abdominal tenderness. There is no rebound.  Musculoskeletal:        General: Tenderness present.     Cervical back: Normal range of motion.     Comments: Left shoulder pain  Skin:    General: Skin is warm and dry.  Neurological:     Mental Status: She is alert and oriented to person, place, and time.     Coordination: Coordination normal.     Vitals:   06/02/19 0815  BP:  136/84  Pulse: 93  Temp: 98.2 F (36.8 C)  TempSrc: Oral  SpO2: 99%  Weight: 149 lb (67.6 kg)  Height: 5\' 4"  (1.626 m)   EKG: rate 81, axis normal, interval normal, sinus, couple PVCs, otherwise no st or t wave changes, no significant change when compared to 2016  This visit occurred during the SARS-CoV-2 public health emergency.  Safety protocols were in place, including screening questions prior to the visit, additional usage of staff PPE, and extensive cleaning of exam room while observing appropriate contact time as indicated for disinfecting solutions.   Assessment & Plan:  Flu shot given at visit

## 2019-06-02 NOTE — Assessment & Plan Note (Addendum)
EKG done today and reviewed with patient. No significant changes. She is overall low CV risk for planned surgical procedure and should proceed without further testing. Counseled about need for bowel regimen when taking pain medication to avoid constipation.

## 2019-06-05 ENCOUNTER — Ambulatory Visit: Payer: Medicare Other | Admitting: Internal Medicine

## 2019-06-16 ENCOUNTER — Encounter (HOSPITAL_BASED_OUTPATIENT_CLINIC_OR_DEPARTMENT_OTHER): Payer: Self-pay | Admitting: Orthopedic Surgery

## 2019-06-16 ENCOUNTER — Other Ambulatory Visit: Payer: Self-pay

## 2019-06-19 ENCOUNTER — Other Ambulatory Visit (HOSPITAL_COMMUNITY)
Admission: RE | Admit: 2019-06-19 | Discharge: 2019-06-19 | Disposition: A | Discharge status: Home / Self Care | Payer: Medicare Other | Source: Ambulatory Visit | Attending: Orthopedic Surgery | Admitting: Orthopedic Surgery

## 2019-06-19 DIAGNOSIS — Z01812 Encounter for preprocedural laboratory examination: Secondary | ICD-10-CM | POA: Diagnosis not present

## 2019-06-19 DIAGNOSIS — Z20822 Contact with and (suspected) exposure to covid-19: Secondary | ICD-10-CM | POA: Diagnosis not present

## 2019-06-19 LAB — SARS CORONAVIRUS 2 (TAT 6-24 HRS): SARS Coronavirus 2: NEGATIVE

## 2019-06-23 ENCOUNTER — Ambulatory Visit (HOSPITAL_COMMUNITY): Payer: Medicare Other

## 2019-06-23 ENCOUNTER — Other Ambulatory Visit: Payer: Self-pay

## 2019-06-23 ENCOUNTER — Ambulatory Visit (HOSPITAL_BASED_OUTPATIENT_CLINIC_OR_DEPARTMENT_OTHER)
Admission: RE | Admit: 2019-06-23 | Discharge: 2019-06-23 | Disposition: A | Discharge status: Home / Self Care | Payer: Medicare Other | Attending: Orthopedic Surgery | Admitting: Orthopedic Surgery

## 2019-06-23 ENCOUNTER — Ambulatory Visit (HOSPITAL_BASED_OUTPATIENT_CLINIC_OR_DEPARTMENT_OTHER): Payer: Medicare Other | Admitting: Anesthesiology

## 2019-06-23 ENCOUNTER — Encounter (HOSPITAL_BASED_OUTPATIENT_CLINIC_OR_DEPARTMENT_OTHER): Payer: Self-pay | Admitting: Orthopedic Surgery

## 2019-06-23 ENCOUNTER — Encounter (HOSPITAL_BASED_OUTPATIENT_CLINIC_OR_DEPARTMENT_OTHER)
Admission: RE | Disposition: A | Discharge status: Home / Self Care | Payer: Self-pay | Source: Home / Self Care | Attending: Orthopedic Surgery

## 2019-06-23 DIAGNOSIS — Z8042 Family history of malignant neoplasm of prostate: Secondary | ICD-10-CM | POA: Insufficient documentation

## 2019-06-23 DIAGNOSIS — Z888 Allergy status to other drugs, medicaments and biological substances status: Secondary | ICD-10-CM | POA: Insufficient documentation

## 2019-06-23 DIAGNOSIS — Z803 Family history of malignant neoplasm of breast: Secondary | ICD-10-CM | POA: Insufficient documentation

## 2019-06-23 DIAGNOSIS — Z8249 Family history of ischemic heart disease and other diseases of the circulatory system: Secondary | ICD-10-CM | POA: Insufficient documentation

## 2019-06-23 DIAGNOSIS — Z79899 Other long term (current) drug therapy: Secondary | ICD-10-CM | POA: Insufficient documentation

## 2019-06-23 DIAGNOSIS — Z818 Family history of other mental and behavioral disorders: Secondary | ICD-10-CM | POA: Insufficient documentation

## 2019-06-23 DIAGNOSIS — Z8261 Family history of arthritis: Secondary | ICD-10-CM | POA: Insufficient documentation

## 2019-06-23 DIAGNOSIS — Z853 Personal history of malignant neoplasm of breast: Secondary | ICD-10-CM | POA: Diagnosis not present

## 2019-06-23 DIAGNOSIS — Z471 Aftercare following joint replacement surgery: Secondary | ICD-10-CM | POA: Diagnosis not present

## 2019-06-23 DIAGNOSIS — K219 Gastro-esophageal reflux disease without esophagitis: Secondary | ICD-10-CM | POA: Insufficient documentation

## 2019-06-23 DIAGNOSIS — Z9221 Personal history of antineoplastic chemotherapy: Secondary | ICD-10-CM | POA: Diagnosis not present

## 2019-06-23 DIAGNOSIS — Z823 Family history of stroke: Secondary | ICD-10-CM | POA: Diagnosis not present

## 2019-06-23 DIAGNOSIS — F329 Major depressive disorder, single episode, unspecified: Secondary | ICD-10-CM | POA: Insufficient documentation

## 2019-06-23 DIAGNOSIS — G8918 Other acute postprocedural pain: Secondary | ICD-10-CM | POA: Diagnosis not present

## 2019-06-23 DIAGNOSIS — F509 Eating disorder, unspecified: Secondary | ICD-10-CM | POA: Diagnosis not present

## 2019-06-23 DIAGNOSIS — Z791 Long term (current) use of non-steroidal anti-inflammatories (NSAID): Secondary | ICD-10-CM | POA: Insufficient documentation

## 2019-06-23 DIAGNOSIS — Z9049 Acquired absence of other specified parts of digestive tract: Secondary | ICD-10-CM | POA: Insufficient documentation

## 2019-06-23 DIAGNOSIS — Z8711 Personal history of peptic ulcer disease: Secondary | ICD-10-CM | POA: Insufficient documentation

## 2019-06-23 DIAGNOSIS — M19012 Primary osteoarthritis, left shoulder: Secondary | ICD-10-CM | POA: Diagnosis not present

## 2019-06-23 DIAGNOSIS — Z882 Allergy status to sulfonamides status: Secondary | ICD-10-CM | POA: Diagnosis not present

## 2019-06-23 DIAGNOSIS — E890 Postprocedural hypothyroidism: Secondary | ICD-10-CM | POA: Insufficient documentation

## 2019-06-23 DIAGNOSIS — Z811 Family history of alcohol abuse and dependence: Secondary | ICD-10-CM | POA: Insufficient documentation

## 2019-06-23 DIAGNOSIS — E039 Hypothyroidism, unspecified: Secondary | ICD-10-CM | POA: Diagnosis not present

## 2019-06-23 DIAGNOSIS — Z96612 Presence of left artificial shoulder joint: Secondary | ICD-10-CM | POA: Diagnosis not present

## 2019-06-23 DIAGNOSIS — Z885 Allergy status to narcotic agent status: Secondary | ICD-10-CM | POA: Insufficient documentation

## 2019-06-23 DIAGNOSIS — Z8349 Family history of other endocrine, nutritional and metabolic diseases: Secondary | ICD-10-CM | POA: Insufficient documentation

## 2019-06-23 DIAGNOSIS — Z6825 Body mass index (BMI) 25.0-25.9, adult: Secondary | ICD-10-CM | POA: Diagnosis not present

## 2019-06-23 DIAGNOSIS — M25712 Osteophyte, left shoulder: Secondary | ICD-10-CM | POA: Diagnosis not present

## 2019-06-23 DIAGNOSIS — Z923 Personal history of irradiation: Secondary | ICD-10-CM | POA: Diagnosis not present

## 2019-06-23 HISTORY — PX: TOTAL SHOULDER ARTHROPLASTY: SHX126

## 2019-06-23 SURGERY — ARTHROPLASTY, SHOULDER, TOTAL
Anesthesia: Regional | Site: Shoulder | Laterality: Left

## 2019-06-23 MED ORDER — TRANEXAMIC ACID-NACL 1000-0.7 MG/100ML-% IV SOLN
INTRAVENOUS | Status: DC | PRN
  Administered 2019-06-23: 1000 mg via INTRAVENOUS

## 2019-06-23 MED ORDER — ALBUMIN HUMAN 5 % IV SOLN
12.5000 g | Freq: Once | INTRAVENOUS | Status: AC
  Administered 2019-06-23: 12.5 g via INTRAVENOUS

## 2019-06-23 MED ORDER — ONDANSETRON HCL 4 MG PO TABS: 4.0000 mg | ORAL_TABLET | Freq: Three times a day (TID) | ORAL | 0 refills | Status: DC | PRN

## 2019-06-23 MED ORDER — PROPOFOL 10 MG/ML IV BOLUS
INTRAVENOUS | Status: DC | PRN
  Administered 2019-06-23: 120 mg via INTRAVENOUS

## 2019-06-23 MED ORDER — ROCURONIUM BROMIDE 10 MG/ML (PF) SYRINGE
PREFILLED_SYRINGE | INTRAVENOUS | Status: AC
  Filled 2019-06-23: qty 10

## 2019-06-23 MED ORDER — ACETAMINOPHEN 500 MG PO TABS: 1000.0000 mg | ORAL_TABLET | Freq: Once | ORAL | Status: DC

## 2019-06-23 MED ORDER — ACETAMINOPHEN 500 MG PO TABS
1000.0000 mg | ORAL_TABLET | Freq: Once | ORAL | Status: AC
  Administered 2019-06-23: 1000 mg via ORAL

## 2019-06-23 MED ORDER — LIDOCAINE 2% (20 MG/ML) 5 ML SYRINGE
INTRAMUSCULAR | Status: AC
  Filled 2019-06-23: qty 5

## 2019-06-23 MED ORDER — LACTATED RINGERS IV SOLN: INTRAVENOUS | Status: DC

## 2019-06-23 MED ORDER — BUPIVACAINE HCL (PF) 0.5 % IJ SOLN
INTRAMUSCULAR | Status: DC | PRN
  Administered 2019-06-23: 15 mL via PERINEURAL

## 2019-06-23 MED ORDER — HYDROCODONE-ACETAMINOPHEN 10-325 MG PO TABS: 1.0000 | ORAL_TABLET | Freq: Four times a day (QID) | ORAL | 0 refills | Status: DC | PRN

## 2019-06-23 MED ORDER — PHENYLEPHRINE HCL (PRESSORS) 10 MG/ML IV SOLN
INTRAVENOUS | Status: AC
  Filled 2019-06-23: qty 3

## 2019-06-23 MED ORDER — ONDANSETRON HCL 4 MG/2ML IJ SOLN
INTRAMUSCULAR | Status: DC | PRN
  Administered 2019-06-23: 4 mg via INTRAVENOUS

## 2019-06-23 MED ORDER — BACLOFEN 10 MG PO TABS: 10.0000 mg | ORAL_TABLET | Freq: Three times a day (TID) | ORAL | 0 refills | Status: DC

## 2019-06-23 MED ORDER — ACETAMINOPHEN 500 MG PO TABS
ORAL_TABLET | ORAL | Status: AC
  Filled 2019-06-23: qty 2

## 2019-06-23 MED ORDER — SUGAMMADEX SODIUM 200 MG/2ML IV SOLN
INTRAVENOUS | Status: DC | PRN
  Administered 2019-06-23: 140 mg via INTRAVENOUS

## 2019-06-23 MED ORDER — MIDAZOLAM HCL 2 MG/2ML IJ SOLN
1.0000 mg | INTRAMUSCULAR | Status: DC | PRN
  Administered 2019-06-23: 10:00:00 2 mg via INTRAVENOUS

## 2019-06-23 MED ORDER — ONDANSETRON HCL 4 MG/2ML IJ SOLN
INTRAMUSCULAR | Status: AC
  Filled 2019-06-23: qty 2

## 2019-06-23 MED ORDER — PHENYLEPHRINE 40 MCG/ML (10ML) SYRINGE FOR IV PUSH (FOR BLOOD PRESSURE SUPPORT)
PREFILLED_SYRINGE | INTRAVENOUS | Status: DC | PRN
  Administered 2019-06-23: 120 ug via INTRAVENOUS

## 2019-06-23 MED ORDER — ROCURONIUM BROMIDE 10 MG/ML (PF) SYRINGE
PREFILLED_SYRINGE | INTRAVENOUS | Status: DC | PRN
  Administered 2019-06-23: 60 mg via INTRAVENOUS

## 2019-06-23 MED ORDER — PHENYLEPHRINE HCL-NACL 10-0.9 MG/250ML-% IV SOLN
INTRAVENOUS | Status: DC | PRN
  Administered 2019-06-23: 50 ug/min via INTRAVENOUS

## 2019-06-23 MED ORDER — MIDAZOLAM HCL 2 MG/2ML IJ SOLN
INTRAMUSCULAR | Status: AC
  Filled 2019-06-23: qty 2

## 2019-06-23 MED ORDER — ALBUMIN HUMAN 5 % IV SOLN
INTRAVENOUS | Status: AC
  Filled 2019-06-23: qty 250

## 2019-06-23 MED ORDER — DEXAMETHASONE SODIUM PHOSPHATE 10 MG/ML IJ SOLN
INTRAMUSCULAR | Status: DC | PRN
  Administered 2019-06-23: 5 mg via INTRAVENOUS

## 2019-06-23 MED ORDER — POVIDONE-IODINE 10 % EX SWAB
2.0000 "application " | Freq: Once | CUTANEOUS | Status: AC
  Administered 2019-06-23: 2 via TOPICAL

## 2019-06-23 MED ORDER — FENTANYL CITRATE (PF) 100 MCG/2ML IJ SOLN: 25.0000 ug | INTRAMUSCULAR | Status: DC | PRN

## 2019-06-23 MED ORDER — CEFAZOLIN SODIUM-DEXTROSE 2-4 GM/100ML-% IV SOLN
INTRAVENOUS | Status: AC
  Filled 2019-06-23: qty 100

## 2019-06-23 MED ORDER — CHLORHEXIDINE GLUCONATE 4 % EX LIQD: 60.0000 mL | Freq: Once | CUTANEOUS | Status: DC

## 2019-06-23 MED ORDER — SENNA-DOCUSATE SODIUM 8.6-50 MG PO TABS: 2.0000 | ORAL_TABLET | Freq: Every day | ORAL | 1 refills | Status: DC

## 2019-06-23 MED ORDER — BUPIVACAINE LIPOSOME 1.3 % IJ SUSP
INTRAMUSCULAR | Status: DC | PRN
  Administered 2019-06-23: 10 mL via PERINEURAL

## 2019-06-23 MED ORDER — FENTANYL CITRATE (PF) 100 MCG/2ML IJ SOLN
50.0000 ug | INTRAMUSCULAR | Status: DC | PRN
  Administered 2019-06-23 (×2): 50 ug via INTRAVENOUS

## 2019-06-23 MED ORDER — CEFAZOLIN SODIUM-DEXTROSE 2-4 GM/100ML-% IV SOLN
2.0000 g | INTRAVENOUS | Status: AC
  Administered 2019-06-23: 2 g via INTRAVENOUS

## 2019-06-23 MED ORDER — PROPOFOL 10 MG/ML IV BOLUS
INTRAVENOUS | Status: AC
  Filled 2019-06-23: qty 20

## 2019-06-23 MED ORDER — SODIUM CHLORIDE 0.9 % IV SOLN
INTRAVENOUS | Status: AC | PRN
  Administered 2019-06-23: 650 mL

## 2019-06-23 MED ORDER — FENTANYL CITRATE (PF) 100 MCG/2ML IJ SOLN
INTRAMUSCULAR | Status: AC
  Filled 2019-06-23: qty 2

## 2019-06-23 SURGICAL SUPPLY — 84 items
BIT DRILL 5/64X5 DISP (BIT) IMPLANT
BIT DRILL 7/64X5 DISP (BIT) IMPLANT
BIT DRILL QUICK RELEASE PRPHRL (DRILL) ×3 IMPLANT
BLADE HEX COATED 2.75 (ELECTRODE) ×3 IMPLANT
BLADE SAW SAG 29X58X.64 (BLADE) IMPLANT
BLADE SAW SGTL MED 73X18.5 STR (BLADE) ×3 IMPLANT
BLADE SURG 10 STRL SS (BLADE) IMPLANT
BLADE SURG 15 STRL LF DISP TIS (BLADE) IMPLANT
BLADE SURG 15 STRL SS (BLADE)
BOWL SMART MIX CTS (DISPOSABLE) IMPLANT
CEMENT BONE R 1X40 (Cement) ×3 IMPLANT
CLOSURE STERI-STRIP 1/2X4 (GAUZE/BANDAGES/DRESSINGS) ×1
CLSR STERI-STRIP ANTIMIC 1/2X4 (GAUZE/BANDAGES/DRESSINGS) ×2 IMPLANT
COVER BACK TABLE 60X90IN (DRAPES) ×3 IMPLANT
COVER MAYO STAND STRL (DRAPES) ×3 IMPLANT
COVER WAND RF STERILE (DRAPES) IMPLANT
DRAPE IMP U-DRAPE 54X76 (DRAPES) IMPLANT
DRAPE SURG 17X23 STRL (DRAPES) ×6 IMPLANT
DRAPE U-SHAPE 47X51 STRL (DRAPES) ×3 IMPLANT
DRAPE U-SHAPE 76X120 STRL (DRAPES) ×6 IMPLANT
DRILL QUICK RELEASE PERIPHERAL (DRILL) ×9
DRSG MEPILEX BORDER 4X8 (GAUZE/BANDAGES/DRESSINGS) ×3 IMPLANT
DURAPREP 26ML APPLICATOR (WOUND CARE) ×3 IMPLANT
ELECT BLADE 6.5 EXT (BLADE) IMPLANT
ELECT REM PT RETURN 9FT ADLT (ELECTROSURGICAL) ×3
ELECTRODE REM PT RTRN 9FT ADLT (ELECTROSURGICAL) ×1 IMPLANT
GLENOID PEGGED HYBRID SM 4MM (Orthopedic Implant) ×3 IMPLANT
GLENOID POST REGENREX HYBRID (Orthopedic Implant) ×3 IMPLANT
GLOVE BIO SURGEON STRL SZ7 (GLOVE) ×3 IMPLANT
GLOVE BIOGEL PI IND STRL 7.0 (GLOVE) ×5 IMPLANT
GLOVE BIOGEL PI IND STRL 8 (GLOVE) ×1 IMPLANT
GLOVE BIOGEL PI INDICATOR 7.0 (GLOVE) ×10
GLOVE BIOGEL PI INDICATOR 8 (GLOVE) ×2
GLOVE ECLIPSE 6.5 STRL STRAW (GLOVE) ×6 IMPLANT
GLOVE ORTHO TXT STRL SZ7.5 (GLOVE) ×3 IMPLANT
GLOVE SURG SS PI 7.0 STRL IVOR (GLOVE) ×3 IMPLANT
GOWN STRL REUS W/ TWL LRG LVL3 (GOWN DISPOSABLE) ×3 IMPLANT
GOWN STRL REUS W/ TWL XL LVL3 (GOWN DISPOSABLE) ×1 IMPLANT
GOWN STRL REUS W/TWL LRG LVL3 (GOWN DISPOSABLE) ×6
GOWN STRL REUS W/TWL XL LVL3 (GOWN DISPOSABLE) ×2
HANDPIECE INTERPULSE COAX TIP (DISPOSABLE) ×2
HEAD HUMERAL BIPOLAR 38X19X39 (Miscellaneous) ×1 IMPLANT
HEAD HUMERAL COMP STD (Orthopedic Implant) ×1 IMPLANT
HOOD PEEL AWAY FLYTE STAYCOOL (MISCELLANEOUS) ×9 IMPLANT
HUMERAL HEAD BIPOLAR 38X19X39 (Miscellaneous) ×3 IMPLANT
HUMERAL HEAD COMP STD (Orthopedic Implant) ×3 IMPLANT
KIT TURNOVER KIT B (KITS) ×3 IMPLANT
MANIFOLD NEPTUNE II (INSTRUMENTS) ×3 IMPLANT
NS IRRIG 1000ML POUR BTL (IV SOLUTION) ×3 IMPLANT
PACK BASIN DAY SURGERY FS (CUSTOM PROCEDURE TRAY) ×3 IMPLANT
PACK SHOULDER (CUSTOM PROCEDURE TRAY) ×3 IMPLANT
PIN HUMERAL STMN 3.2MMX9IN (INSTRUMENTS) ×3 IMPLANT
PIN STEINMANN THREADED TIP (PIN) ×3 IMPLANT
RETRIEVER SUT HEWSON (MISCELLANEOUS) IMPLANT
SET HNDPC FAN SPRY TIP SCT (DISPOSABLE) ×1 IMPLANT
SHEET MEDIUM DRAPE 40X70 STRL (DRAPES) ×3 IMPLANT
SLEEVE SCD COMPRESS KNEE MED (MISCELLANEOUS) ×3 IMPLANT
SLING ARM FOAM STRAP LRG (SOFTGOODS) ×3 IMPLANT
SLING ARM IMMOBILIZER LRG (SOFTGOODS) IMPLANT
SLING ARM IMMOBILIZER MED (SOFTGOODS) IMPLANT
SMARTMIX MINI TOWER (MISCELLANEOUS) ×6
SPONGE LAP 18X18 RF (DISPOSABLE) IMPLANT
SPONGE LAP 4X18 RFD (DISPOSABLE) IMPLANT
STEM HUMERAL STRL 10MMX55MM (Stem) ×3 IMPLANT
SUCTION FRAZIER HANDLE 10FR (MISCELLANEOUS) ×2
SUCTION TUBE FRAZIER 10FR DISP (MISCELLANEOUS) ×1 IMPLANT
SUPPORT WRAP ARM LG (MISCELLANEOUS) ×3 IMPLANT
SUT FIBERWIRE #2 38 REV NDL BL (SUTURE) ×15
SUT FIBERWIRE #2 38 T-5 BLUE (SUTURE)
SUT MNCRL AB 4-0 PS2 18 (SUTURE) ×3 IMPLANT
SUT VIC AB 0 CT1 27 (SUTURE) ×2
SUT VIC AB 0 CT1 27XBRD ANBCTR (SUTURE) ×1 IMPLANT
SUT VIC AB 2-0 CT1 27 (SUTURE) ×2
SUT VIC AB 2-0 CT1 TAPERPNT 27 (SUTURE) ×1 IMPLANT
SUT VIC AB 2-0 SH 27 (SUTURE)
SUT VIC AB 2-0 SH 27XBRD (SUTURE) IMPLANT
SUTURE FIBERWR #2 38 T-5 BLUE (SUTURE) IMPLANT
SUTURE FIBERWR#2 38 REV NDL BL (SUTURE) ×5 IMPLANT
TAPE STRIPS DRAPE STRL (GAUZE/BANDAGES/DRESSINGS) IMPLANT
TOWEL GREEN STERILE FF (TOWEL DISPOSABLE) ×6 IMPLANT
TOWER SMARTMIX MINI (MISCELLANEOUS) ×2 IMPLANT
TUBE CONNECTING 20'X1/4 (TUBING)
TUBE CONNECTING 20X1/4 (TUBING) IMPLANT
YANKAUER SUCT BULB TIP NO VENT (SUCTIONS) ×3 IMPLANT

## 2019-06-23 NOTE — Anesthesia Procedure Notes (Signed)
Anesthesia Regional Block: Interscalene brachial plexus block   Pre-Anesthetic Checklist: ,, timeout performed, Correct Patient, Correct Site, Correct Laterality, Correct Procedure, Correct Position, site marked, Risks and benefits discussed,  Surgical consent,  Pre-op evaluation,  At surgeon's request and post-op pain management  Laterality: Left  Prep: Maximum Sterile Barrier Precautions used, chloraprep       Needles:  Injection technique: Single-shot  Needle Type: Echogenic Stimulator Needle     Needle Length: 4cm  Needle Gauge: 22     Additional Needles:   Procedures:,,,, ultrasound used (permanent image in chart),,,,  Narrative:  Start time: 06/23/2019 9:32 AM End time: 06/23/2019 9:42 AM Injection made incrementally with aspirations every 5 mL.  Performed by: Personally  Anesthesiologist: Freddrick March, MD  Additional Notes: Monitors applied. No increased pain on injection. No increased resistance to injection. Injection made in 5cc increments. Good needle visualization. Patient tolerated procedure well.

## 2019-06-23 NOTE — Progress Notes (Signed)
Assisted Dr. Woodrum with left, ultrasound guided, interscalene  block. Side rails up, monitors on throughout procedure. See vital signs in flow sheet. Tolerated Procedure well. 

## 2019-06-23 NOTE — Op Note (Signed)
06/23/2019  12:27 PM  PATIENT:  Robin Valencia    PRE-OPERATIVE DIAGNOSIS: Left shoulder primary localized osteoarthritis  POST-OPERATIVE DIAGNOSIS:  Same  PROCEDURE: LEFT total Shoulder Arthroplasty  SURGEON:  Johnny Bridge, MD  PHYSICIAN ASSISTANT: Merlene Pulling, PA-C, present and scrubbed throughout the case, critical for completion in a timely fashion, and for retraction, instrumentation, and closure.  ANESTHESIA:   General with an interscalene block  ESTIMATED BLOOD LOSS: 125 mL  UNIQUE ASPECTS OF THE CASE: She is extremely stiff during examination under anesthesia.  She had a fair amount of contracture around her subscapularis.  Access to the glenoid was very challenging, I went back and released more of the posterior glenohumeral ligaments off of the humeral neck, taking care to protect the axillary nerve, but this was somewhat challenging.  Her humeral head was fairly vertical, with loss of contour, and she was very small.  The glenoid bone stock was somewhat poor, I did have the central vault contained but I was just at the limit of the medial wall.  I was contained within the bone superiorly, although anterior inferior and posterior inferior were bicortical.  I had a small shelf of bone from the preparation on the inferior aspect, and was flush superiorly.  The humerus was fairly tight, bone quality was intact, I had a good press-fit, and I will ream to an 11 but only place to 10 because it was tight.  I was flush with the bone posteriorly, anteriorly had a little bit of recess I suspect because of impaction from the [facility] retractor during the glenoid exposure.  The subscapularis repair came together appropriately, although she was tight, and did not want to externally rotate beyond neutral.  PREOPERATIVE INDICATIONS:  Robin Valencia is a  74 y.o. female who failed conservative measures and elected for surgical management.    The risks benefits and alternatives were discussed with the  patient preoperatively including but not limited to the risks of infection, bleeding, nerve injury, cardiopulmonary complications, the need for revision surgery, dislocation, loosening, incomplete relief of pain, among others, and the patient was willing to proceed.   OPERATIVE IMPLANTS: Biomet size 10 micro press-fit humeral stem, size 38+18 Versa-dial humeral head, set in the E position with increased coverage posteriorly, with a small cemented glenoid polyethylene 3 peg implant with a central regenerex noncemented post.   OPERATIVE FINDINGS: Advanced glenohumeral osteoarthritis involving the glenoid and the humeral head with substantial osteophyte formation inferiorly.   OPERATIVE PROCEDURE: The patient was brought to the operating room and placed in the supine position. General anesthesia was administered. IV antibiotics were given.  The upper extremity was prepped and draped in usual sterile fashion. The patient was in a beachchair position with all bony prominences padded. She had an Exparel block.  Time out was performed and a deltopectoral approach was carried out. The biceps tendon was tenodesed to the pectoralis tendon. The subscapularis was released, tagging it with a #2 FiberWire, leaving a cuff of tendon for repair.   The inferior osteophyte was removed, and release of the capsule off of the humeral side was completed. The head was dislocated, and I reamed sequentially. I placed the humeral cutting guide at 30 of retroversion, and then pinned this into place, and made my humeral neck cut. This was at the appropriate level.   I then placed deep retractors and exposed the glenoid. I excised the labrum circumferentially, taking care to protect the axillary nerve inferiorly.   I then placed  a guidewire into the center position, controlling appropriate version and inclination. I then reamed over the guidewire with the small reamer, and was satisfied with the preparation. I preserved the  subchondral bone in order to maximize the strength and minimize the risk for subsequent subsidence. She was fairly shallow to begin with.  I then drilled the central hole for the regenerex peg, and then placed the guide, and then drilled the 3 peripheral peg holes. I had excellent bony circumferential contact.   I then cleaned the glenoid, irrigated it copiously, and then dried it and cemented the prosthesis into place. Excellent seating was achieved. I had full exposure. The cement cured while I turned my attention to the humeral side.   I sequentially broached, up to the selected size, with the broach set at 30 of retroversion. I placed 3 #2 Fiberwire through the bone for subsequent repair.  I then placed the real stem. I trialed with multiple heads, and the above-named component was selected. Increased posterior coverage improved the coverage. The soft tissue tension was appropriate.   I then impacted the real humeral head into place, reduced the head, and irrigated copiously. Excellent stability and range of motion was achieved. I repaired the subscapularis with a total of 5 #2 FiberWire; one for the interval, one for the corner, and then the remaining three from the lesser tuberosity which had already been passed.  Excellent repair achieved and I irrigated copiously once more. The subcutaneous tissue was closed with Vicryl including the deltopectoral fascia.   The skin was closed with Steri-Strips and sterile gauze was applied. She had a preoperative nerve block. She tolerated the procedure well and there were no complications.

## 2019-06-23 NOTE — Anesthesia Preprocedure Evaluation (Addendum)
Anesthesia Evaluation  Patient identified by MRN, date of birth, ID band Patient awake    Reviewed: Allergy & Precautions, NPO status , Patient's Chart, lab work & pertinent test results  Airway Mallampati: II  TM Distance: >3 FB Neck ROM: Full    Dental no notable dental hx. (+) Teeth Intact, Dental Advisory Given   Pulmonary neg pulmonary ROS,    Pulmonary exam normal breath sounds clear to auscultation       Cardiovascular negative cardio ROS Normal cardiovascular exam Rhythm:Regular Rate:Normal     Neuro/Psych PSYCHIATRIC DISORDERS Depression negative neurological ROS     GI/Hepatic Neg liver ROS, PUD, GERD  Medicated,  Endo/Other  Hypothyroidism   Renal/GU negative Renal ROS  negative genitourinary   Musculoskeletal  (+) Arthritis ,   Abdominal   Peds  Hematology negative hematology ROS (+)   Anesthesia Other Findings   Reproductive/Obstetrics                            Anesthesia Physical Anesthesia Plan  ASA: II  Anesthesia Plan: General and Regional   Post-op Pain Management:  Regional for Post-op pain   Induction: Intravenous  PONV Risk Score and Plan: 3 and Midazolam, Dexamethasone and Ondansetron  Airway Management Planned: Oral ETT  Additional Equipment:   Intra-op Plan:   Post-operative Plan: Extubation in OR  Informed Consent: I have reviewed the patients History and Physical, chart, labs and discussed the procedure including the risks, benefits and alternatives for the proposed anesthesia with the patient or authorized representative who has indicated his/her understanding and acceptance.     Dental advisory given  Plan Discussed with: CRNA  Anesthesia Plan Comments:         Anesthesia Quick Evaluation

## 2019-06-23 NOTE — Anesthesia Procedure Notes (Signed)
Procedure Name: Intubation Date/Time: 06/23/2019 10:05 AM Performed by: Raenette Rover, CRNA Pre-anesthesia Checklist: Patient identified, Emergency Drugs available, Suction available and Patient being monitored Patient Re-evaluated:Patient Re-evaluated prior to induction Oxygen Delivery Method: Circle system utilized Preoxygenation: Pre-oxygenation with 100% oxygen Induction Type: IV induction Ventilation: Mask ventilation without difficulty Laryngoscope Size: Mac and 3 Grade View: Grade I Tube type: Oral Tube size: 7.0 mm Number of attempts: 1 Airway Equipment and Method: Stylet Placement Confirmation: ETT inserted through vocal cords under direct vision,  positive ETCO2 and breath sounds checked- equal and bilateral Secured at: 21 cm Tube secured with: Tape Dental Injury: Teeth and Oropharynx as per pre-operative assessment

## 2019-06-23 NOTE — Progress Notes (Signed)
OT suggest that patient needs shower chair and that it would not be delivered to her house. Called patient to leave a message, but went to voicemail. Left message.Setzer, Marchelle Folks

## 2019-06-23 NOTE — Anesthesia Postprocedure Evaluation (Signed)
Anesthesia Post Note  Patient: Gitel Nissley  Procedure(s) Performed: LEFT TOTAL SHOULDER ARTHROPLASTY (Left Shoulder)     Patient location during evaluation: PACU Anesthesia Type: Regional and General Level of consciousness: awake and alert Pain management: pain level controlled Vital Signs Assessment: post-procedure vital signs reviewed and stable Respiratory status: spontaneous breathing, nonlabored ventilation, respiratory function stable and patient connected to nasal cannula oxygen Cardiovascular status: stable Postop Assessment: no apparent nausea or vomiting Anesthetic complications: no Comments: Patient slightly hypotensive in PACU. Patient given 2 liters of crystalloid and 250 ml of 5% albumin. Patient remains asymptomatic and requests discharge to home. Surgeon notified. Plan to discharge, however an overnight bed in [place] is an option, if patient desires. Above communicated to patient and phase II nurse.     Last Vitals:  Vitals:   06/23/19 1615 06/23/19 1630  BP: (!) 92/51 (!) 96/54  Pulse: 78 86  Resp: 18 (!) 21  Temp: 36.8 C   SpO2: 97% 97%    Last Pain:  Vitals:   06/23/19 1630  TempSrc:   PainSc: 0-No pain                 Marelly Wehrman P Newton Frutiger

## 2019-06-23 NOTE — Evaluation (Signed)
Occupational Therapy Evaluation Patient Details Name: Robin Valencia MRN: [id number] DOB: April 20, 1946 Today's Date: 06/23/2019    History of Present Illness 74 yo female s/p left total shoulder arthroplasty. PMH including arthritis, Breast cancer of upper-outer quadrant of right, depression, and eating disorder.      Clinical Impression   PTA, pt was living alone and was independent; pt's sister planning to stay at dc. Currently, pt requires Min-Mod A for ADLs and functional mobility using RW. Provided education and handout on shoulder precautions, RUE exercises, sling management, UB ADLs, sleep positioning, and tub transfer with shower seat; pt demonstrated and verbalized understanding. Pt participating in RUE exercises for hand, wrist, and elbow; requiring assistance as nerve block still in place.  Answered all pt questions. Recommend dc home once medically stable per physician. All acute OT needs met and will sign off. Thank you.    Follow Up Recommendations  No OT follow up;Supervision/Assistance - 24 hour    Equipment Recommendations  Tub/shower seat    Recommendations for Other Services PT consult     Precautions / Restrictions Precautions Precautions: Shoulder Type of Shoulder Precautions: Conservative protocal. No shoulder movement. AROM of hand, wrist, and elbow Shoulder Interventions: Shoulder sling/immobilizer;Off for dressing/bathing/exercises Precaution Booklet Issued: Yes (comment) Precaution Comments: Reviewed shoulder handout and provided education on shoulder precautions and compensatory techniques for ADLs Required Braces or Orthoses: Sling Restrictions Weight Bearing Restrictions: Yes LUE Weight Bearing: Non weight bearing Other Position/Activity Restrictions: No shoulder movement      Mobility Bed Mobility               General bed mobility comments: Sitting in recliner  Transfers                 General transfer comment: defer due to BP     Balance                                           ADL either performed or assessed with clinical judgement   ADL Overall ADL's : Needs assistance/impaired Eating/Feeding: Set up;Sitting   Grooming: Set up;Sitting   Upper Body Bathing: Minimal assistance;Sitting Upper Body Bathing Details (indicate cue type and reason): Educating pt on UB bathing techniques and adherance to shoulder precautions Lower Body Bathing: Min guard;Sitting/lateral leans   Upper Body Dressing : Moderate assistance;Sitting Upper Body Dressing Details (indicate cue type and reason): Mod A for donning sling. Educating pt on compensatory techniques for donning shirts and managing sling Lower Body Dressing: Minimal assistance;Sitting/lateral leans             Tub/Shower Transfer Details (indicate cue type and reason): Educaing pt on use of shower seat for tub to sitting to adhere to precautions Functional mobility during ADLs: (Defered due to BP) General ADL Comments: Educating pt on shoulder precautions, UB ADLs, LB ADLs, sling management, sleep positions, and exercises. Pt verablizing udnerstanding     Vision         Perception     Praxis      Pertinent Vitals/Pain Pain Assessment: Faces Faces Pain Scale: Hurts little more Pain Location: Left shoulder Pain Descriptors / Indicators: Discomfort Pain Intervention(s): Monitored during session;Repositioned     Hand Dominance Right   Extremity/Trunk Assessment Upper Extremity Assessment Upper Extremity Assessment: LUE deficits/detail LUE Deficits / Details: s/p total shoulder replacement. Continues to have numbness or nerve block limiting AROM.  LUE Coordination: decreased fine motor;decreased gross motor   Lower Extremity Assessment Lower Extremity Assessment: Overall WFL for tasks assessed   Cervical / Trunk Assessment Cervical / Trunk Assessment: Normal   Communication Communication Communication: No difficulties    Cognition Arousal/Alertness: Awake/alert Behavior During Therapy: WFL for tasks assessed/performed Overall Cognitive Status: Within Functional Limits for tasks assessed                                 General Comments: Very attentive to education   General Comments  BP upon arrival 94/47. BP upon leaving 96/54    Exercises Exercises: Shoulder Shoulder Exercises Elbow Flexion: AAROM;Self ROM;10 reps;Left;Seated Elbow Extension: AAROM;Self ROM;Left;10 reps;Seated Wrist Flexion: AAROM;Left;10 reps;Seated Wrist Extension: AROM;Left;10 reps;Seated Digit Composite Flexion: AAROM;Left;10 reps;Seated Composite Extension: AAROM;Left;10 reps;Seated   Shoulder Instructions Shoulder Instructions Donning/doffing shirt without moving shoulder: Moderate assistance Method for sponge bathing under operated UE: Min-guard Donning/doffing sling/immobilizer: Moderate assistance Correct positioning of sling/immobilizer: Moderate assistance ROM for elbow, wrist and digits of operated UE: Moderate assistance Sling wearing schedule (on at all times/off for ADL's): Independent Proper positioning of operated UE when showering: Min-guard Positioning of UE while sleeping: Minimal assistance    Home Living Family/patient expects to be discharged to:: Private residence Living Arrangements: Alone Available Help at Discharge: Family;Available 24 hours/day(planning for sister to stay at dc) Type of Home: House Home Access: Stairs to enter [employer] of Steps: 3 Entrance Stairs-Rails: None Home Layout: One level     Bathroom Shower/Tub: Corporate investment banker: Standard     Home Equipment: None   Additional Comments: Has two [employer]      Prior Functioning/Environment Level of Independence: Independent                 OT Problem List: Decreased activity tolerance;Decreased knowledge of use of DME or AE;Decreased knowledge of  precautions;Impaired UE functional use      OT Treatment/Interventions:      OT Goals(Current goals can be found in the care plan section) Acute Rehab OT Goals Patient Stated Goal: "Go home to my doggies" OT Goal Formulation: With patient Time For Goal Achievement: 07/07/19 Potential to Achieve Goals: Good  OT Frequency:     Barriers to D/C:            Co-evaluation              AM-PAC OT "6 Clicks" Daily Activity     Outcome Measure Help from another person eating meals?: None Help from another person taking care of personal grooming?: A Little Help from another person toileting, which includes using toliet, bedpan, or urinal?: A Little Help from another person bathing (including washing, rinsing, drying)?: A Little Help from another person to put on and taking off regular upper body clothing?: A Lot Help from another person to put on and taking off regular lower body clothing?: A Little 6 Click Score: 18   End of Session Equipment Utilized During Treatment: Other (comment)(Sling) Nurse Communication: Mobility status  Activity Tolerance: Patient tolerated treatment well Patient left: in chair;with call bell/phone within reach;with nursing/sitter in room  OT Visit Diagnosis: Unsteadiness on feet (R26.81);Other abnormalities of gait and mobility (R26.89);Muscle weakness (generalized) (M62.81)                Time: 1962-2297 OT Time Calculation (min): 41 min Charges:  OT General Charges $OT Visit: 1 Visit OT Evaluation $OT Eval  Low Complexity: 1 Low OT Treatments $Self Care/Home Management : 23-37 mins  Frimy Uffelman MSOT, OTR/L Acute Rehab Pager: [phone] Office: [place] 06/23/2019, 5:12 PM

## 2019-06-23 NOTE — Discharge Instructions (Signed)
Diet: As you were doing prior to hospitalization   Shower:  May shower but keep the wounds dry, use an occlusive plastic wrap, NO SOAKING IN TUB.  If the bandage gets wet, change with a clean dry gauze.  If you have a splint on, leave the splint in place and keep the splint dry with a plastic bag.  Dressing:  You may change your dressing 3-5 days after surgery, unless you have a splint.  If you have a splint, then just leave the splint in place and we will change your bandages during your first follow-up appointment.    If you had hand or foot surgery, we will plan to remove your stitches in about 2 weeks in the office.  For all other surgeries, there are sticky tapes (steri-strips) on your wounds and all the stitches are absorbable.  Leave the steri-strips in place when changing your dressings, they will peel off with time, usually 2-3 weeks.  Activity:  Increase activity slowly as tolerated, but follow the weight bearing instructions below.  The rules on driving is that you can not be taking narcotics while you drive, and you must feel in control of the vehicle.    Weight Bearing:   No weight bearing with left arm.  To prevent constipation: you may use a stool softener such as -  Colace (over the counter) 100 mg by mouth twice a day  Drink plenty of fluids (prune juice may be helpful) and high fiber foods Miralax (over the counter) for constipation as needed.    Itching:  If you experience itching with your medications, try taking only a single pain pill, or even half a pain pill at a time.  You may take up to 10 pain pills per day, and you can also use benadryl over the counter for itching or also to help with sleep.   Precautions:  If you experience chest pain or shortness of breath - call 911 immediately for transfer to the hospital emergency department!!  If you develop a fever greater that 101 F, purulent drainage from wound, increased redness or drainage from wound, or calf pain -- Call  the office at [phone]                                                Follow- Up Appointment:  Please call for an appointment to be seen in 2 weeks [place] - [phone]   NO TYLENOL PRODUCTS UNTIL  3:20 PM    Information for Discharge Teaching: EXPAREL (bupivacaine liposome injectable suspension)   Your surgeon or anesthesiologist gave you EXPAREL(bupivacaine) to help control your pain after surgery.   EXPAREL is a local anesthetic that provides pain relief by numbing the tissue around the surgical site.  EXPAREL is designed to release pain medication over time and can control pain for up to 72 hours.  Depending on how you respond to EXPAREL, you may require less pain medication during your recovery.  Possible side effects:  Temporary loss of sensation or ability to move in the area where bupivacaine was injected.  Nausea, vomiting, constipation  Rarely, numbness and tingling in your mouth or lips, lightheadedness, or anxiety may occur.  Call your doctor right away if you think you may be experiencing any of these sensations, or if you have other questions regarding possible side effects.  Follow all  other discharge instructions given to you by your surgeon or nurse. Eat a healthy diet and drink plenty of water or other fluids.  If you return to the hospital for any reason within 96 hours following the administration of EXPAREL, it is important for health care providers to know that you have received this anesthetic. A teal colored band has been placed on your arm with the date, time and amount of EXPAREL you have received in order to alert and inform your health care providers. Please leave this armband in place for the full 96 hours following administration, and then you may remove the band.Information for Discharge Teaching: EXPAREL (bupivacaine liposome injectable suspension)   Your surgeon or anesthesiologist gave you EXPAREL(bupivacaine) to help control your pain  after surgery.   EXPAREL is a local anesthetic that provides pain relief by numbing the tissue around the surgical site.  EXPAREL is designed to release pain medication over time and can control pain for up to 72 hours.  Depending on how you respond to EXPAREL, you may require less pain medication during your recovery.  Possible side effects:  Temporary loss of sensation or ability to move in the area where bupivacaine was injected.  Nausea, vomiting, constipation  Rarely, numbness and tingling in your mouth or lips, lightheadedness, or anxiety may occur.  Call your doctor right away if you think you may be experiencing any of these sensations, or if you have other questions regarding possible side effects.  Follow all other discharge instructions given to you by your surgeon or nurse. Eat a healthy diet and drink plenty of water or other fluids.  If you return to the hospital for any reason within 96 hours following the administration of EXPAREL, it is important for health care providers to know that you have received this anesthetic. A teal colored band has been placed on your arm with the date, time and amount of EXPAREL you have received in order to alert and inform your health care providers. Please leave this armband in place for the full 96 hours following administration, and then you may remove the band.   Post Anesthesia Home Care Instructions  Activity: Get plenty of rest for the remainder of the day. A responsible individual must stay with you for 24 hours following the procedure.  For the next 24 hours, DO NOT: -Drive a car -Paediatric nurse -Drink alcoholic beverages -Take any medication unless instructed by your physician -Make any legal decisions or sign important papers.  Meals: Start with liquid foods such as gelatin or soup. Progress to regular foods as tolerated. Avoid greasy, spicy, heavy foods. If nausea and/or vomiting occur, drink only clear liquids until  the nausea and/or vomiting subsides. Call your physician if vomiting continues.  Special Instructions/Symptoms: Your throat may feel dry or sore from the anesthesia or the breathing tube placed in your throat during surgery. If this causes discomfort, gargle with warm salt water. The discomfort should disappear within 24 hours.  If you had a scopolamine patch placed behind your ear for the management of post- operative nausea and/or vomiting:  1. The medication in the patch is effective for 72 hours, after which it should be removed.  Wrap patch in a tissue and discard in the trash. Wash hands thoroughly with soap and water. 2. You may remove the patch earlier than 72 hours if you experience unpleasant side effects which may include dry mouth, dizziness or visual disturbances. 3. Avoid touching the patch. Wash your hands with  soap and water after contact with the patch.     Regional Anesthesia Blocks  1. Numbness or the inability to move the "blocked" extremity may last from 3-48 hours after placement. The length of time depends on the medication injected and your individual response to the medication. If the numbness is not going away after 48 hours, call your surgeon.  2. The extremity that is blocked will need to be protected until the numbness is gone and the  Strength has returned. Because you cannot feel it, you will need to take extra care to avoid injury. Because it may be weak, you may have difficulty moving it or using it. You may not know what position it is in without looking at it while the block is in effect.  3. For blocks in the legs and feet, returning to weight bearing and walking needs to be done carefully. You will need to wait until the numbness is entirely gone and the strength has returned. You should be able to move your leg and foot normally before you try and bear weight or walk. You will need someone to be with you when you first try to ensure you do not fall and possibly  risk injury.  4. Bruising and tenderness at the needle site are common side effects and will resolve in a few days.  5. Persistent numbness or new problems with movement should be communicated to the surgeon or the [facility] [phone] [place] [phone]).

## 2019-06-23 NOTE — H&P (Signed)
PREOPERATIVE H&P  Chief Complaint: Left shoulder pain  HPI: Iliah Moffitt is a 74 y.o. female who presents for preoperative history and physical with a diagnosis of left shoulder primary localized osteoarthritis. Symptoms are rated as moderate to severe, and have been worsening.  This is significantly impairing activities of daily living.  She has elected for surgical management.   She has failed injections, activity modification, anti-inflammatories, and assistive devices.  Preoperative X-rays demonstrate end stage degenerative changes with osteophyte formation, loss of joint space, subchondral sclerosis.   Past Medical History:  Diagnosis Date  . Arthritis   . Breast cancer of upper-outer quadrant of right female breast ([place])   . Depression   . Eating disorder   . History of stomach ulcers   . Hypothyroidism   . Jaundice   . Pancreatitis 2009  . Personal history of chemotherapy   . Personal history of radiation therapy   . Reflux esophagitis    Past Surgical History:  Procedure Laterality Date  . BREAST LUMPECTOMY    . BREAST SURGERY    . CESAREAN SECTION    . CESAREAN SECTION WITH BILATERAL TUBAL LIGATION    . CHOLECYSTECTOMY    . EXPLORATORY LAPAROTOMY     pancreatits due to gallbladder surgery  . MASTECTOMY Right   . THYROIDECTOMY    . TONSILLECTOMY     Social History   Socioeconomic History  . Marital status: Divorced    Spouse name: Not on file  . Number of children: 1  . Years of education: Not on file  . Highest education level: Not on file  Occupational History  . Occupation: Law firm  Tobacco Use  . Smoking status: Never Smoker  . Smokeless tobacco: Never Used  Substance and Sexual Activity  . Alcohol use: No  . Drug use: No  . Sexual activity: Never  Other Topics Concern  . Not on file  Social History Narrative  . Not on file   Social Determinants of Health   Financial Resource Strain: Low Risk   . Difficulty of Paying Living Expenses: Not  hard at all  Food Insecurity: No Food Insecurity  . Worried About Charity fundraiser in the Last Year: Never true  . Ran Out of Food in the Last Year: Never true  Transportation Needs: No Transportation Needs  . Lack of Transportation (Medical): No  . Lack of Transportation (Non-Medical): No  Physical Activity: Inactive  . Days of Exercise per Week: 0 days  . Minutes of Exercise per Session: 0 min  Stress: No Stress Concern Present  . Feeling of Stress : Not at all  Social Connections: Unknown  . Frequency of Communication with Friends and Family: More than three times a week  . Frequency of Social Gatherings with Friends and Family: More than three times a week  . Attends Religious Services: Not on file  . Active Member of Clubs or Organizations: Not on file  . Attends Archivist Meetings: Not on file  . Marital Status: Divorced   Family History  Problem Relation Age of Onset  . Arthritis Mother   . Mental illness Mother   . Depression Mother   . Alcohol abuse Father   . Hyperlipidemia Father   . Heart disease Father   . Stroke Father   . Hypertension Father   . Hypertension Sister   . Mental illness Sister   . Breast cancer Sister 47  . Mental illness Brother   . Depression Brother   .  Cancer Paternal Grandfather        prostate  . Breast cancer Maternal Aunt    Allergies  Allergen Reactions  . Codeine Nausea And Vomiting  . Gabapentin Nausea And Vomiting  . Sulfa Antibiotics Diarrhea   Prior to Admission medications   Medication Sig Start Date End Date Taking? Authorizing Provider  acetaminophen (TYLENOL) 500 MG tablet Take 500 mg by mouth every 6 (six) hours as needed.   Yes [provider]  brexpiprazole (REXULTI) TABS tablet Take by mouth daily.   Yes [provider]  buPROPion (WELLBUTRIN XL) 150 MG 24 hr tablet Take 150 mg by mouth daily.   Yes [provider]  citalopram (CELEXA) 40 MG tablet Take 1 tablet (40 mg total)  by mouth daily. 08/01/17  Yes Eksir, Richard Miu, MD  levothyroxine (SYNTHROID) 50 MCG tablet TAKE 1 TABLET BY MOUTH  DAILY BEFORE BREAKFAST 05/18/19  Yes Hoyt Koch, MD  meloxicam (MOBIC) 7.5 MG tablet Take 7.5 mg by mouth daily.   Yes [provider]  Multiple Vitamin (MULTIVITAMIN ADULT PO) Take by mouth.   Yes [provider]  omeprazole (PRILOSEC) 20 MG capsule TAKE 1 CAPSULE BY MOUTH TWO TIMES DAILY 04/29/19  Yes Hoyt Koch, MD  vitamin B-12 (CYANOCOBALAMIN) 100 MCG tablet Take 100 mcg by mouth daily.   Yes [provider]  buPROPion (WELLBUTRIN XL) 300 MG 24 hr tablet Take 1 tablet (300 mg total) by mouth every morning. Take with 150 mg for a total of 450 mg daily 08/01/17 08/01/18  Aundra Dubin, MD  triamcinolone cream (KENALOG) 0.1 % Apply 1 application topically 2 (two) times daily. 07/07/18   Hoyt Koch, MD     Positive ROS: All other systems have been reviewed and were otherwise negative with the exception of those mentioned in the HPI and as above.  Physical Exam: General: Alert, no acute distress Cardiovascular: No pedal edema Respiratory: No cyanosis, no use of accessory musculature GI: No organomegaly, abdomen is soft and non-tender Skin: No lesions in the area of chief complaint Neurologic: Sensation intact distally Psychiatric: Patient is competent for consent with normal mood and affect Lymphatic: No axillary or cervical lymphadenopathy  MUSCULOSKELETAL: Left shoulder has active motion 0 to 175 degrees, external rotation to 0 degrees, internal rotation to the lateral thigh.  Significant crepitance and weakness with motion.  Assessment: Left shoulder primary localized osteoarthritis   Plan: Plan for Procedure(s): LEFT TOTAL SHOULDER ARTHROPLASTY  The risks benefits and alternatives were discussed with the patient including but not limited to the risks of nonoperative treatment, versus surgical  intervention including infection, bleeding, nerve injury,  blood clots, cardiopulmonary complications, morbidity, mortality, among others, and they were willing to proceed.    Patient's anticipated LOS is less than 2 midnights, meeting these requirements: - Younger than 40 - Lives within 1 hour of care - Has a competent adult at home to recover with post-op recover - NO history of  - Chronic pain requiring opiods  - Diabetes  - Coronary Artery Disease  - Heart failure  - Heart attack  - Stroke  - DVT/VTE  - Cardiac arrhythmia  - Respiratory Failure/COPD  - Renal failure  - Anemia  - Advanced Liver disease        Johnny Bridge, MD Cell [phone]   06/23/2019 9:24 AM

## 2019-06-23 NOTE — Transfer of Care (Signed)
Immediate Anesthesia Transfer of Care Note  Patient: Robin Valencia  Procedure(s) Performed: LEFT TOTAL SHOULDER ARTHROPLASTY (Left Shoulder)  Patient Location: PACU  Anesthesia Type:GA combined with regional for post-op pain  Level of Consciousness: awake, alert , oriented, drowsy and patient cooperative  Airway & Oxygen Therapy: Patient Spontanous Breathing and Patient connected to nasal cannula oxygen  Post-op Assessment: Report given to RN and Post -op Vital signs reviewed and stable  Post vital signs: Reviewed and stable  Last Vitals:  Vitals Value Taken Time  BP 129/57 06/23/19 1256  Temp    Pulse 91 06/23/19 1259  Resp 19 06/23/19 1259  SpO2 99 % 06/23/19 1259  Vitals shown include unvalidated device data.  Last Pain:  Vitals:   06/23/19 0917  TempSrc: Oral  PainSc: 6       Patients Stated Pain Goal: 3 ([id number])  Complications: No apparent anesthesia complications

## 2019-06-24 ENCOUNTER — Encounter: Payer: Self-pay | Admitting: *Deleted

## 2019-07-06 DIAGNOSIS — Z96612 Presence of left artificial shoulder joint: Secondary | ICD-10-CM | POA: Diagnosis not present

## 2019-07-30 DIAGNOSIS — Z23 Encounter for immunization: Secondary | ICD-10-CM | POA: Diagnosis not present

## 2019-08-03 DIAGNOSIS — M19012 Primary osteoarthritis, left shoulder: Secondary | ICD-10-CM | POA: Diagnosis not present

## 2019-08-04 ENCOUNTER — Other Ambulatory Visit: Payer: Self-pay | Admitting: Internal Medicine

## 2019-08-04 DIAGNOSIS — E890 Postprocedural hypothyroidism: Secondary | ICD-10-CM

## 2019-08-05 DIAGNOSIS — M25511 Pain in right shoulder: Secondary | ICD-10-CM | POA: Diagnosis not present

## 2019-08-05 DIAGNOSIS — M25611 Stiffness of right shoulder, not elsewhere classified: Secondary | ICD-10-CM | POA: Diagnosis not present

## 2019-08-05 DIAGNOSIS — M6281 Muscle weakness (generalized): Secondary | ICD-10-CM | POA: Diagnosis not present

## 2019-08-10 DIAGNOSIS — M25611 Stiffness of right shoulder, not elsewhere classified: Secondary | ICD-10-CM | POA: Diagnosis not present

## 2019-08-10 DIAGNOSIS — M25511 Pain in right shoulder: Secondary | ICD-10-CM | POA: Diagnosis not present

## 2019-08-10 DIAGNOSIS — M6281 Muscle weakness (generalized): Secondary | ICD-10-CM | POA: Diagnosis not present

## 2019-08-13 DIAGNOSIS — M6281 Muscle weakness (generalized): Secondary | ICD-10-CM | POA: Diagnosis not present

## 2019-08-13 DIAGNOSIS — M25611 Stiffness of right shoulder, not elsewhere classified: Secondary | ICD-10-CM | POA: Diagnosis not present

## 2019-08-13 DIAGNOSIS — M25511 Pain in right shoulder: Secondary | ICD-10-CM | POA: Diagnosis not present

## 2019-08-17 DIAGNOSIS — M6281 Muscle weakness (generalized): Secondary | ICD-10-CM | POA: Diagnosis not present

## 2019-08-17 DIAGNOSIS — M25611 Stiffness of right shoulder, not elsewhere classified: Secondary | ICD-10-CM | POA: Diagnosis not present

## 2019-08-17 DIAGNOSIS — M25511 Pain in right shoulder: Secondary | ICD-10-CM | POA: Diagnosis not present

## 2019-08-18 ENCOUNTER — Other Ambulatory Visit: Payer: Self-pay | Admitting: Internal Medicine

## 2019-08-20 DIAGNOSIS — M6281 Muscle weakness (generalized): Secondary | ICD-10-CM | POA: Diagnosis not present

## 2019-08-20 DIAGNOSIS — M25511 Pain in right shoulder: Secondary | ICD-10-CM | POA: Diagnosis not present

## 2019-08-20 DIAGNOSIS — M25611 Stiffness of right shoulder, not elsewhere classified: Secondary | ICD-10-CM | POA: Diagnosis not present

## 2019-08-24 DIAGNOSIS — M25511 Pain in right shoulder: Secondary | ICD-10-CM | POA: Diagnosis not present

## 2019-08-24 DIAGNOSIS — M6281 Muscle weakness (generalized): Secondary | ICD-10-CM | POA: Diagnosis not present

## 2019-08-24 DIAGNOSIS — M25611 Stiffness of right shoulder, not elsewhere classified: Secondary | ICD-10-CM | POA: Diagnosis not present

## 2019-08-27 DIAGNOSIS — M25611 Stiffness of right shoulder, not elsewhere classified: Secondary | ICD-10-CM | POA: Diagnosis not present

## 2019-08-27 DIAGNOSIS — M25511 Pain in right shoulder: Secondary | ICD-10-CM | POA: Diagnosis not present

## 2019-08-27 DIAGNOSIS — M6281 Muscle weakness (generalized): Secondary | ICD-10-CM | POA: Diagnosis not present

## 2019-08-27 DIAGNOSIS — Z23 Encounter for immunization: Secondary | ICD-10-CM | POA: Diagnosis not present

## 2019-08-31 DIAGNOSIS — M25611 Stiffness of right shoulder, not elsewhere classified: Secondary | ICD-10-CM | POA: Diagnosis not present

## 2019-08-31 DIAGNOSIS — M6281 Muscle weakness (generalized): Secondary | ICD-10-CM | POA: Diagnosis not present

## 2019-08-31 DIAGNOSIS — M25511 Pain in right shoulder: Secondary | ICD-10-CM | POA: Diagnosis not present

## 2019-09-03 DIAGNOSIS — M25611 Stiffness of right shoulder, not elsewhere classified: Secondary | ICD-10-CM | POA: Diagnosis not present

## 2019-09-03 DIAGNOSIS — M6281 Muscle weakness (generalized): Secondary | ICD-10-CM | POA: Diagnosis not present

## 2019-09-03 DIAGNOSIS — M25511 Pain in right shoulder: Secondary | ICD-10-CM | POA: Diagnosis not present

## 2019-09-08 DIAGNOSIS — M25511 Pain in right shoulder: Secondary | ICD-10-CM | POA: Diagnosis not present

## 2019-09-08 DIAGNOSIS — M6281 Muscle weakness (generalized): Secondary | ICD-10-CM | POA: Diagnosis not present

## 2019-09-08 DIAGNOSIS — M25611 Stiffness of right shoulder, not elsewhere classified: Secondary | ICD-10-CM | POA: Diagnosis not present

## 2019-09-14 DIAGNOSIS — M6281 Muscle weakness (generalized): Secondary | ICD-10-CM | POA: Diagnosis not present

## 2019-09-14 DIAGNOSIS — M25511 Pain in right shoulder: Secondary | ICD-10-CM | POA: Diagnosis not present

## 2019-09-14 DIAGNOSIS — M25611 Stiffness of right shoulder, not elsewhere classified: Secondary | ICD-10-CM | POA: Diagnosis not present

## 2019-09-22 DIAGNOSIS — M25511 Pain in right shoulder: Secondary | ICD-10-CM | POA: Diagnosis not present

## 2019-09-22 DIAGNOSIS — M25611 Stiffness of right shoulder, not elsewhere classified: Secondary | ICD-10-CM | POA: Diagnosis not present

## 2019-09-22 DIAGNOSIS — M6281 Muscle weakness (generalized): Secondary | ICD-10-CM | POA: Diagnosis not present

## 2019-09-24 DIAGNOSIS — M25611 Stiffness of right shoulder, not elsewhere classified: Secondary | ICD-10-CM | POA: Diagnosis not present

## 2019-09-24 DIAGNOSIS — M25511 Pain in right shoulder: Secondary | ICD-10-CM | POA: Diagnosis not present

## 2019-09-24 DIAGNOSIS — M6281 Muscle weakness (generalized): Secondary | ICD-10-CM | POA: Diagnosis not present

## 2019-09-28 ENCOUNTER — Telehealth: Payer: Self-pay

## 2019-09-28 NOTE — Telephone Encounter (Signed)
New message    The patient voiced she would like Dr. Sharlet Salina to called her in something C/o outbreak of herpes.   Offer an appt to see the MD the patient decline the appt asking can a message be sent around to the MD   [facility], [address] AT [place]

## 2019-09-29 DIAGNOSIS — M25611 Stiffness of right shoulder, not elsewhere classified: Secondary | ICD-10-CM | POA: Diagnosis not present

## 2019-09-29 DIAGNOSIS — M25511 Pain in right shoulder: Secondary | ICD-10-CM | POA: Diagnosis not present

## 2019-09-29 DIAGNOSIS — M6281 Muscle weakness (generalized): Secondary | ICD-10-CM | POA: Diagnosis not present

## 2019-09-29 MED ORDER — VALACYCLOVIR HCL 1 G PO TABS: 1000.0000 mg | ORAL_TABLET | Freq: Two times a day (BID) | ORAL | 0 refills | Status: DC

## 2019-09-29 NOTE — Telephone Encounter (Signed)
Please advise this 

## 2019-09-29 NOTE — Telephone Encounter (Signed)
Sent in valtrex for flare, 1 pill twice a day for 10 days.

## 2019-09-30 DIAGNOSIS — M25611 Stiffness of right shoulder, not elsewhere classified: Secondary | ICD-10-CM | POA: Diagnosis not present

## 2019-09-30 DIAGNOSIS — M6281 Muscle weakness (generalized): Secondary | ICD-10-CM | POA: Diagnosis not present

## 2019-09-30 DIAGNOSIS — M25511 Pain in right shoulder: Secondary | ICD-10-CM | POA: Diagnosis not present

## 2019-09-30 NOTE — Telephone Encounter (Signed)
Called [facility] letting patient know prescription had been sent to pharmacy

## 2019-10-02 DIAGNOSIS — M25512 Pain in left shoulder: Secondary | ICD-10-CM | POA: Diagnosis not present

## 2019-10-02 DIAGNOSIS — M25511 Pain in right shoulder: Secondary | ICD-10-CM | POA: Diagnosis not present

## 2020-01-01 DIAGNOSIS — M25511 Pain in right shoulder: Secondary | ICD-10-CM | POA: Diagnosis not present

## 2020-02-24 DIAGNOSIS — H2513 Age-related nuclear cataract, bilateral: Secondary | ICD-10-CM | POA: Diagnosis not present

## 2020-02-24 DIAGNOSIS — H43811 Vitreous degeneration, right eye: Secondary | ICD-10-CM | POA: Diagnosis not present

## 2020-02-24 DIAGNOSIS — H16223 Keratoconjunctivitis sicca, not specified as Sjogren's, bilateral: Secondary | ICD-10-CM | POA: Diagnosis not present

## 2020-03-03 DIAGNOSIS — F3341 Major depressive disorder, recurrent, in partial remission: Secondary | ICD-10-CM | POA: Diagnosis not present

## 2020-03-24 DIAGNOSIS — H2512 Age-related nuclear cataract, left eye: Secondary | ICD-10-CM | POA: Diagnosis not present

## 2020-03-31 ENCOUNTER — Other Ambulatory Visit: Payer: Self-pay | Admitting: Internal Medicine

## 2020-04-06 DIAGNOSIS — H2511 Age-related nuclear cataract, right eye: Secondary | ICD-10-CM | POA: Diagnosis not present

## 2020-04-07 DIAGNOSIS — H2511 Age-related nuclear cataract, right eye: Secondary | ICD-10-CM | POA: Diagnosis not present

## 2020-04-12 ENCOUNTER — Other Ambulatory Visit: Payer: Self-pay | Admitting: Internal Medicine

## 2020-04-12 DIAGNOSIS — E890 Postprocedural hypothyroidism: Secondary | ICD-10-CM

## 2020-04-14 DIAGNOSIS — Z23 Encounter for immunization: Secondary | ICD-10-CM | POA: Diagnosis not present

## 2020-04-18 ENCOUNTER — Telehealth: Payer: Self-pay | Admitting: Internal Medicine

## 2020-04-18 MED ORDER — VALACYCLOVIR HCL 1 G PO TABS: 1000.0000 mg | ORAL_TABLET | Freq: Two times a day (BID) | ORAL | 0 refills | Status: DC

## 2020-04-18 NOTE — Telephone Encounter (Signed)
Sent in refill valtrex.

## 2020-04-18 NOTE — Telephone Encounter (Signed)
Left detailed message informing pt of below.  

## 2020-04-18 NOTE — Telephone Encounter (Signed)
  Patient states she has had issue with herpes in the past Patient requesting medication to genital herpes be called to [facility], [address] AT [place]

## 2020-05-02 DIAGNOSIS — F3341 Major depressive disorder, recurrent, in partial remission: Secondary | ICD-10-CM | POA: Diagnosis not present

## 2020-05-31 DIAGNOSIS — F3181 Bipolar II disorder: Secondary | ICD-10-CM | POA: Diagnosis not present

## 2020-05-31 DIAGNOSIS — F410 Panic disorder [episodic paroxysmal anxiety] without agoraphobia: Secondary | ICD-10-CM | POA: Diagnosis not present

## 2020-06-07 DIAGNOSIS — F3341 Major depressive disorder, recurrent, in partial remission: Secondary | ICD-10-CM | POA: Diagnosis not present

## 2020-06-07 DIAGNOSIS — F410 Panic disorder [episodic paroxysmal anxiety] without agoraphobia: Secondary | ICD-10-CM | POA: Diagnosis not present

## 2020-06-14 DIAGNOSIS — F3181 Bipolar II disorder: Secondary | ICD-10-CM | POA: Diagnosis not present

## 2020-06-14 DIAGNOSIS — F410 Panic disorder [episodic paroxysmal anxiety] without agoraphobia: Secondary | ICD-10-CM | POA: Diagnosis not present

## 2020-06-21 DIAGNOSIS — F410 Panic disorder [episodic paroxysmal anxiety] without agoraphobia: Secondary | ICD-10-CM | POA: Diagnosis not present

## 2020-06-21 DIAGNOSIS — F3181 Bipolar II disorder: Secondary | ICD-10-CM | POA: Diagnosis not present

## 2020-06-28 DIAGNOSIS — F410 Panic disorder [episodic paroxysmal anxiety] without agoraphobia: Secondary | ICD-10-CM | POA: Diagnosis not present

## 2020-06-28 DIAGNOSIS — F3181 Bipolar II disorder: Secondary | ICD-10-CM | POA: Diagnosis not present

## 2020-07-01 ENCOUNTER — Ambulatory Visit: Payer: Medicare Other | Admitting: Internal Medicine

## 2020-07-05 DIAGNOSIS — F3181 Bipolar II disorder: Secondary | ICD-10-CM | POA: Diagnosis not present

## 2020-07-05 DIAGNOSIS — F410 Panic disorder [episodic paroxysmal anxiety] without agoraphobia: Secondary | ICD-10-CM | POA: Diagnosis not present

## 2020-07-11 DIAGNOSIS — F410 Panic disorder [episodic paroxysmal anxiety] without agoraphobia: Secondary | ICD-10-CM | POA: Diagnosis not present

## 2020-07-11 DIAGNOSIS — F3341 Major depressive disorder, recurrent, in partial remission: Secondary | ICD-10-CM | POA: Diagnosis not present

## 2020-07-18 ENCOUNTER — Ambulatory Visit: Payer: Medicare Other | Admitting: Internal Medicine

## 2020-07-21 ENCOUNTER — Ambulatory Visit: Payer: Medicare Other | Admitting: Internal Medicine

## 2020-07-22 ENCOUNTER — Encounter: Payer: Self-pay | Admitting: Internal Medicine

## 2020-07-22 ENCOUNTER — Ambulatory Visit (INDEPENDENT_AMBULATORY_CARE_PROVIDER_SITE_OTHER): Payer: Medicare Other | Admitting: Internal Medicine

## 2020-07-22 ENCOUNTER — Other Ambulatory Visit: Payer: Self-pay

## 2020-07-22 VITALS — BP 118/90 | HR 95 | Temp 98.4°F | Resp 18 | Ht 64.0 in | Wt 154.6 lb

## 2020-07-22 DIAGNOSIS — R7301 Impaired fasting glucose: Secondary | ICD-10-CM | POA: Diagnosis not present

## 2020-07-22 DIAGNOSIS — K2100 Gastro-esophageal reflux disease with esophagitis, without bleeding: Secondary | ICD-10-CM

## 2020-07-22 DIAGNOSIS — R21 Rash and other nonspecific skin eruption: Secondary | ICD-10-CM

## 2020-07-22 DIAGNOSIS — E890 Postprocedural hypothyroidism: Secondary | ICD-10-CM

## 2020-07-22 DIAGNOSIS — R5383 Other fatigue: Secondary | ICD-10-CM | POA: Diagnosis not present

## 2020-07-22 DIAGNOSIS — E559 Vitamin D deficiency, unspecified: Secondary | ICD-10-CM

## 2020-07-22 DIAGNOSIS — Z23 Encounter for immunization: Secondary | ICD-10-CM

## 2020-07-22 DIAGNOSIS — E538 Deficiency of other specified B group vitamins: Secondary | ICD-10-CM

## 2020-07-22 DIAGNOSIS — Z1211 Encounter for screening for malignant neoplasm of colon: Secondary | ICD-10-CM

## 2020-07-22 LAB — CBC
HCT: 45.3 % (ref 36.0–46.0)
Hemoglobin: 15.6 g/dL — ABNORMAL HIGH (ref 12.0–15.0)
MCHC: 34.5 g/dL (ref 30.0–36.0)
MCV: 92.4 fl (ref 78.0–100.0)
Platelets: 237 10*3/uL (ref 150.0–400.0)
RBC: 4.9 Mil/uL (ref 3.87–5.11)
RDW: 13.5 % (ref 11.5–15.5)
WBC: 8.3 10*3/uL (ref 4.0–10.5)

## 2020-07-22 LAB — VITAMIN D 25 HYDROXY (VIT D DEFICIENCY, FRACTURES): VITD: 45.97 ng/mL (ref 30.00–100.00)

## 2020-07-22 LAB — COMPREHENSIVE METABOLIC PANEL
ALT: 22 U/L (ref 0–35)
AST: 22 U/L (ref 0–37)
Albumin: 4.4 g/dL (ref 3.5–5.2)
Alkaline Phosphatase: 102 U/L (ref 39–117)
BUN: 18 mg/dL (ref 6–23)
CO2: 28 mEq/L (ref 19–32)
Calcium: 9.8 mg/dL (ref 8.4–10.5)
Chloride: 103 mEq/L (ref 96–112)
Creatinine, Ser: 0.84 mg/dL (ref 0.40–1.20)
GFR: 69.11 mL/min (ref >60.00)
Glucose, Bld: 78 mg/dL (ref 70–99)
Potassium: 4.2 mEq/L (ref 3.5–5.1)
Sodium: 141 mEq/L (ref 135–145)
Total Bilirubin: 0.7 mg/dL (ref 0.2–1.2)
Total Protein: 7.2 g/dL (ref 6.0–8.3)

## 2020-07-22 LAB — LIPID PANEL
Cholesterol: 228 mg/dL — ABNORMAL HIGH (ref 0–200)
HDL: 67 mg/dL (ref >39.00)
LDL Cholesterol: 127 mg/dL — ABNORMAL HIGH (ref 0–99)
NonHDL: 161.07
Total CHOL/HDL Ratio: 3
Triglycerides: 172 mg/dL — ABNORMAL HIGH (ref 0.0–149.0)
VLDL: 34.4 mg/dL (ref 0.0–40.0)

## 2020-07-22 LAB — HEMOGLOBIN A1C: Hgb A1c MFr Bld: 5.6 % (ref 4.6–6.5)

## 2020-07-22 LAB — T4, FREE: Free T4: 0.85 ng/dL (ref 0.60–1.60)

## 2020-07-22 LAB — TSH: TSH: 3.16 u[IU]/mL (ref 0.35–4.50)

## 2020-07-22 LAB — VITAMIN B12: Vitamin B-12: 1251 pg/mL — ABNORMAL HIGH (ref 211–911)

## 2020-07-22 MED ORDER — LEVOTHYROXINE SODIUM 50 MCG PO TABS: 50.0000 ug | ORAL_TABLET | Freq: Every day | ORAL | 3 refills | Status: DC

## 2020-07-22 MED ORDER — TERBINAFINE HCL 250 MG PO TABS: ORAL_TABLET | ORAL | 0 refills | Status: DC

## 2020-07-22 MED ORDER — OMEPRAZOLE 20 MG PO CPDR: 20.0000 mg | DELAYED_RELEASE_CAPSULE | Freq: Two times a day (BID) | ORAL | 3 refills | Status: DC

## 2020-07-22 MED ORDER — TRIAMCINOLONE ACETONIDE 0.1 % EX CREA: 1.0000 "application " | TOPICAL_CREAM | Freq: Two times a day (BID) | CUTANEOUS | 0 refills | Status: DC

## 2020-07-22 NOTE — Patient Instructions (Addendum)
We have sent in the refill of the cream triamcinolone.  We have sent in lamisil for the toenail fungus. Take 1 pill daily for 1 week, then skip for 3 weeks. Then take 1 pill daily for 1 week then skip for 3 weeks.   You can get a covid-19 booster shot in June 2022.   We are checking the labs and will call you back about the results.   We have given you the pneumonia shot and will get cologuard to you in the mail.

## 2020-07-22 NOTE — Assessment & Plan Note (Signed)
Refill prilosec

## 2020-07-22 NOTE — Assessment & Plan Note (Signed)
Rx triamcinolone and advised to use only 1-2 weeks on the face. Avoid eye makeup or lotions.

## 2020-07-22 NOTE — Progress Notes (Signed)
   Subjective:   Patient ID: Robin Valencia, female    DOB: February 15, 1946, 75 y.o.   MRN: [id number]  HPI The patient is a 75 YO female coming in for multiple concerns including rash on face (on the eyes and neck, off and on for some time, had used some prior prescription cream on this rarely and it helps, does use makeup and products on her face but no new ones) and toenail fungus (came back, previously did lamisil and would like to do again, denies pain or itching) and fatigue (takes thyroid medication, denies missing doses, still tired and constipated, not sure if this is normal or not, denies problems with sleeping and wakes feeling refreshed).  Review of Systems  Constitutional: Positive for fatigue.  HENT: Negative.   Eyes: Negative.   Respiratory: Negative for cough, chest tightness and shortness of breath.   Cardiovascular: Negative for chest pain, palpitations and leg swelling.  Gastrointestinal: Positive for constipation. Negative for abdominal distention, abdominal pain, diarrhea, nausea and vomiting.  Musculoskeletal: Positive for arthralgias.  Skin: Positive for rash.  Neurological: Negative.   Psychiatric/Behavioral: Negative.     Objective:  Physical Exam Constitutional:      Appearance: She is well-developed.  HENT:     Head: Normocephalic and atraumatic.  Cardiovascular:     Rate and Rhythm: Normal rate and regular rhythm.  Pulmonary:     Effort: Pulmonary effort is normal. No respiratory distress.     Breath sounds: Normal breath sounds. No wheezing or rales.  Abdominal:     General: Bowel sounds are normal. There is no distension.     Palpations: Abdomen is soft.     Tenderness: There is no abdominal tenderness. There is no rebound.  Musculoskeletal:     Cervical back: Normal range of motion.  Skin:    General: Skin is warm and dry.     Comments: Ras on the eyelids and the neck, toenail fungus noted  Neurological:     Mental Status: She is alert and oriented to  person, place, and time.     Coordination: Coordination normal.     Vitals:   07/22/20 1323  BP: 118/90  Pulse: 95  Resp: 18  Temp: 98.4 F (36.9 C)  TempSrc: Oral  SpO2: 98%  Weight: 154 lb 9.6 oz (70.1 kg)  Height: 5\' 4"  (1.626 m)    This visit occurred during the SARS-CoV-2 public health emergency.  Safety protocols were in place, including screening questions prior to the visit, additional usage of staff PPE, and extensive cleaning of exam room while observing appropriate contact time as indicated for disinfecting solutions.   Assessment & Plan:  Pneumonia 23 given at visit

## 2020-07-22 NOTE — Assessment & Plan Note (Signed)
Checking CBC, CMP, thyroid, B12, vitamin D and adjust as needed.

## 2020-07-22 NOTE — Assessment & Plan Note (Signed)
Taking synthroid 50 mcg daily and will check thyroid levels. Adjust as needed.

## 2020-07-26 DIAGNOSIS — F3341 Major depressive disorder, recurrent, in partial remission: Secondary | ICD-10-CM | POA: Diagnosis not present

## 2020-07-26 DIAGNOSIS — F410 Panic disorder [episodic paroxysmal anxiety] without agoraphobia: Secondary | ICD-10-CM | POA: Diagnosis not present

## 2020-08-02 DIAGNOSIS — F410 Panic disorder [episodic paroxysmal anxiety] without agoraphobia: Secondary | ICD-10-CM | POA: Diagnosis not present

## 2020-08-02 DIAGNOSIS — F3341 Major depressive disorder, recurrent, in partial remission: Secondary | ICD-10-CM | POA: Diagnosis not present

## 2020-08-15 ENCOUNTER — Telehealth (INDEPENDENT_AMBULATORY_CARE_PROVIDER_SITE_OTHER): Payer: Medicare Other | Admitting: Family

## 2020-08-15 DIAGNOSIS — J209 Acute bronchitis, unspecified: Secondary | ICD-10-CM | POA: Diagnosis not present

## 2020-08-15 MED ORDER — BENZONATATE 100 MG PO CAPS: 100.0000 mg | ORAL_CAPSULE | Freq: Three times a day (TID) | ORAL | 0 refills | Status: DC | PRN

## 2020-08-15 MED ORDER — AZITHROMYCIN 250 MG PO TABS: ORAL_TABLET | ORAL | 0 refills | Status: DC

## 2020-08-15 NOTE — Progress Notes (Signed)
Robin Valencia is a 75 y.o. female with the following history as recorded in [employer]:  Patient Active Problem List   Diagnosis Date Noted  . Other fatigue 07/22/2020  . Osteoarthritis of left shoulder 06/23/2019  . Pre-operative cardiovascular examination 06/02/2019  . Labia irritation 07/04/2018  . Rash 10/19/2017  . Genital herpes 07/04/2017  . Encounter for general adult medical examination with abnormal findings 04/26/2017  . Major depressive disorder, recurrent episode, moderate ([place]) 03/19/2016  . Attention and concentration deficit 10/27/2015  . Memory difficulties 10/27/2015  . Hypothyroidism, 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/08/2015  . Depression 04/08/2015  . Reflux esophagitis 04/08/2015  . Malignant neoplasm of upper-outer quadrant of right breast in female, estrogen receptor negative ([place]) 04/06/2015    Current Outpatient Medications  Medication Sig Dispense Refill  . azithromycin (ZITHROMAX) 250 MG tablet 2 tabs po qd x 1 day; 1 tablet per day x 4 days; 6 tablet 0  . benzonatate (TESSALON) 100 MG capsule Take 1 capsule (100 mg total) by mouth 3 (three) times daily as needed for cough. 20 capsule 0  . brexpiprazole (REXULTI) TABS tablet Take by mouth daily.    Marland Kitchen buPROPion (WELLBUTRIN XL) 150 MG 24 hr tablet Take 150 mg by mouth daily.    Marland Kitchen buPROPion (WELLBUTRIN XL) 300 MG 24 hr tablet Take 1 tablet (300 mg total) by mouth every morning. Take with 150 mg for a total of 450 mg daily 90 tablet 3  . levothyroxine (SYNTHROID) 50 MCG tablet Take 1 tablet (50 mcg total) by mouth daily before breakfast. 90 tablet 3  . Multiple Vitamin (MULTIVITAMIN ADULT PO) Take by mouth.    Marland Kitchen omeprazole (PRILOSEC) 20 MG capsule Take 1 capsule (20 mg total) by mouth 2 (two) times daily. 180 capsule 3  . terbinafine (LAMISIL) 250 MG tablet Take 1 pill daily for 1 week, then skip for 3 weeks, then take 1 pill daily for 1 week, then skip for 3 weeks, then take 1 pill daily for 1 week. Then stop. 28 tablet 0  .  triamcinolone (KENALOG) 0.1 % Apply 1 application topically 2 (two) times daily. 100 g 0  . valACYclovir (VALTREX) 1000 MG tablet Take 1 tablet (1,000 mg total) by mouth 2 (two) times daily. (Patient taking differently: Take 1,000 mg by mouth as needed.) 20 tablet 0  . venlafaxine XR (EFFEXOR-XR) 75 MG 24 hr capsule Take 75 mg by mouth daily.    . vitamin B-12 (CYANOCOBALAMIN) 100 MCG tablet Take 100 mcg by mouth daily.     No current facility-administered medications for this visit.    Allergies: Codeine, Gabapentin, and Sulfa antibiotics  Past Medical History:  Diagnosis Date  . Arthritis   . Breast cancer of upper-outer quadrant of right female breast ([place])   . Depression   . Eating disorder   . History of stomach ulcers   . Hypothyroidism   . Jaundice   . Pancreatitis 2009  . Personal history of chemotherapy   . Personal history of radiation therapy   . Reflux esophagitis     Past Surgical History:  Procedure Laterality Date  . BREAST LUMPECTOMY    . BREAST SURGERY    . CESAREAN SECTION    . CESAREAN SECTION WITH BILATERAL TUBAL LIGATION    . CHOLECYSTECTOMY    . EXPLORATORY LAPAROTOMY     pancreatits due to gallbladder surgery  . MASTECTOMY Right   . THYROIDECTOMY    . TONSILLECTOMY    . TOTAL SHOULDER ARTHROPLASTY Left 06/23/2019  Procedure: LEFT TOTAL SHOULDER ARTHROPLASTY;  Surgeon: Marchia Bond, MD;  Location: [facility];  Service: Orthopedics;  Laterality: Left;    Family History  Problem Relation Age of Onset  . Arthritis Mother   . Mental illness Mother   . Depression Mother   . Alcohol abuse Father   . Hyperlipidemia Father   . Heart disease Father   . Stroke Father   . Hypertension Father   . Hypertension Sister   . Mental illness Sister   . Breast cancer Sister 28  . Mental illness Brother   . Depression Brother   . Cancer Paternal Grandfather        prostate  . Breast cancer Maternal Aunt     Social History   Tobacco Use  .  Smoking status: Never Smoker  . Smokeless tobacco: Never Used  Substance Use Topics  . Alcohol use: No    Subjective:   I connected with [name] on 08/15/20 at  2:00 PM EDT by a video enabled telemedicine application and verified that I am speaking with the correct person using two identifiers.   I discussed the limitations of evaluation and management by telemedicine and the availability of in person appointments. The patient expressed understanding and agreed to proceed. Provider in office/ patient is at home; provider and patient are only 2 people on video call.   Worsening cough/ chest congestion x 3 days; prone to bronchitis; " I can hear it in my chest." Taking OTC Mucinex and Nyquil; no fever; has not taken home COVID test- not concerned for recent exposure;     Objective:  There were no vitals filed for this visit.  General: Well developed, well nourished, in no acute distress  Head: Normocephalic and atraumatic  Lungs: Respirations unlabored;  Neurologic: Alert and oriented; speech intact; face symmetrical;   Assessment:  1. Acute bronchitis, unspecified organism     Plan:  Rx for Z-pak and Tessalon Perles; she is also encouraged to use OTC saline spray to help moisturize nasal passages; if no improvement in 24-48 hours, she will call back and agrees to come for COVID testing; increase fluids, rest and follow-up worse, no better.   No follow-ups on file.  No orders of the defined types were placed in this encounter.   Requested Prescriptions   Signed Prescriptions Disp Refills  . azithromycin (ZITHROMAX) 250 MG tablet 6 tablet 0    Sig: 2 tabs po qd x 1 day; 1 tablet per day x 4 days;  . benzonatate (TESSALON) 100 MG capsule 20 capsule 0    Sig: Take 1 capsule (100 mg total) by mouth 3 (three) times daily as needed for cough.

## 2020-08-26 ENCOUNTER — Telehealth: Payer: Self-pay | Admitting: Internal Medicine

## 2020-08-26 MED ORDER — BENZONATATE 100 MG PO CAPS: 100.0000 mg | ORAL_CAPSULE | Freq: Three times a day (TID) | ORAL | 0 refills | Status: DC | PRN

## 2020-08-26 NOTE — Telephone Encounter (Signed)
See below

## 2020-08-26 NOTE — Telephone Encounter (Signed)
Patient called back and said that she would like to refill benzonatate (TESSALON) 100 MG capsule it can be sent to [facility], [address] AT [address]. Please advise

## 2020-08-26 NOTE — Telephone Encounter (Signed)
I would not recommend further antibiotics. Cough can last for up to a month after resolution of other symptoms. Does she need refill tessalon perles?

## 2020-08-26 NOTE — Telephone Encounter (Signed)
Unable to get in contact with the patient. LDVM with Dr. Nathanial Millman recommendation. Office number was provided.

## 2020-08-26 NOTE — Telephone Encounter (Signed)
Patient was seen on 04.11.22 and she finished her medication but she feels like she is getting sick again because her cough is coming back. Patient wondering if she needs a refill of the medication.

## 2020-08-26 NOTE — Telephone Encounter (Signed)
Sent in

## 2020-08-26 NOTE — Addendum Note (Signed)
Addended by: Hoyt Koch on: 08/26/2020 02:35 PM   Modules accepted: Orders

## 2020-09-01 ENCOUNTER — Emergency Department (HOSPITAL_BASED_OUTPATIENT_CLINIC_OR_DEPARTMENT_OTHER): Payer: Medicare Other | Admitting: Radiology

## 2020-09-01 ENCOUNTER — Encounter (HOSPITAL_BASED_OUTPATIENT_CLINIC_OR_DEPARTMENT_OTHER): Payer: Self-pay | Admitting: Radiology

## 2020-09-01 ENCOUNTER — Other Ambulatory Visit: Payer: Self-pay

## 2020-09-01 ENCOUNTER — Emergency Department (HOSPITAL_BASED_OUTPATIENT_CLINIC_OR_DEPARTMENT_OTHER): Payer: Medicare Other

## 2020-09-01 ENCOUNTER — Emergency Department (HOSPITAL_BASED_OUTPATIENT_CLINIC_OR_DEPARTMENT_OTHER)
Admission: EM | Admit: 2020-09-01 | Discharge: 2020-09-01 | Disposition: A | Discharge status: Home / Self Care | Payer: Medicare Other | Attending: Emergency Medicine | Admitting: Emergency Medicine

## 2020-09-01 DIAGNOSIS — R9431 Abnormal electrocardiogram [ECG] [EKG]: Secondary | ICD-10-CM | POA: Diagnosis not present

## 2020-09-01 DIAGNOSIS — Z853 Personal history of malignant neoplasm of breast: Secondary | ICD-10-CM | POA: Diagnosis not present

## 2020-09-01 DIAGNOSIS — Z96612 Presence of left artificial shoulder joint: Secondary | ICD-10-CM | POA: Diagnosis not present

## 2020-09-01 DIAGNOSIS — Z20822 Contact with and (suspected) exposure to covid-19: Secondary | ICD-10-CM | POA: Diagnosis not present

## 2020-09-01 DIAGNOSIS — J029 Acute pharyngitis, unspecified: Secondary | ICD-10-CM | POA: Insufficient documentation

## 2020-09-01 DIAGNOSIS — Z79899 Other long term (current) drug therapy: Secondary | ICD-10-CM | POA: Insufficient documentation

## 2020-09-01 DIAGNOSIS — R6883 Chills (without fever): Secondary | ICD-10-CM | POA: Diagnosis not present

## 2020-09-01 DIAGNOSIS — J40 Bronchitis, not specified as acute or chronic: Secondary | ICD-10-CM | POA: Diagnosis not present

## 2020-09-01 DIAGNOSIS — R059 Cough, unspecified: Secondary | ICD-10-CM | POA: Insufficient documentation

## 2020-09-01 DIAGNOSIS — R0602 Shortness of breath: Secondary | ICD-10-CM | POA: Diagnosis not present

## 2020-09-01 DIAGNOSIS — R911 Solitary pulmonary nodule: Secondary | ICD-10-CM | POA: Diagnosis not present

## 2020-09-01 DIAGNOSIS — E039 Hypothyroidism, unspecified: Secondary | ICD-10-CM | POA: Insufficient documentation

## 2020-09-01 DIAGNOSIS — J988 Other specified respiratory disorders: Secondary | ICD-10-CM | POA: Diagnosis not present

## 2020-09-01 LAB — CBC WITH DIFFERENTIAL/PLATELET
Abs Immature Granulocytes: 0.02 10*3/uL (ref 0.00–0.07)
Basophils Absolute: 0.1 10*3/uL (ref 0.0–0.1)
Basophils Relative: 1 %
Eosinophils Absolute: 0.2 10*3/uL (ref 0.0–0.5)
Eosinophils Relative: 2 %
HCT: 40.4 % (ref 36.0–46.0)
Hemoglobin: 14.2 g/dL (ref 12.0–15.0)
Immature Granulocytes: 0 %
Lymphocytes Relative: 20 %
Lymphs Abs: 1.6 10*3/uL (ref 0.7–4.0)
MCH: 31.3 pg (ref 26.0–34.0)
MCHC: 35.1 g/dL (ref 30.0–36.0)
MCV: 89.2 fL (ref 80.0–100.0)
Monocytes Absolute: 0.7 10*3/uL (ref 0.1–1.0)
Monocytes Relative: 9 %
Neutro Abs: 5.5 10*3/uL (ref 1.7–7.7)
Neutrophils Relative %: 68 %
Platelets: 264 10*3/uL (ref 150–400)
RBC: 4.53 MIL/uL (ref 3.87–5.11)
RDW: 13.1 % (ref 11.5–15.5)
WBC: 8.1 10*3/uL (ref 4.0–10.5)
nRBC: 0 % (ref 0.0–0.2)

## 2020-09-01 LAB — BASIC METABOLIC PANEL
Anion gap: 11 (ref 5–15)
BUN: 21 mg/dL (ref 8–23)
CO2: 23 mmol/L (ref 22–32)
Calcium: 9.1 mg/dL (ref 8.9–10.3)
Chloride: 106 mmol/L (ref 98–111)
Creatinine, Ser: 0.83 mg/dL (ref 0.44–1.00)
GFR, Estimated: >60 mL/min (ref >60)
Glucose, Bld: 97 mg/dL (ref 70–99)
Potassium: 3.8 mmol/L (ref 3.5–5.1)
Sodium: 140 mmol/L (ref 135–145)

## 2020-09-01 LAB — RESP PANEL BY RT-PCR (FLU A&B, COVID) ARPGX2
Influenza A by PCR: NEGATIVE
Influenza B by PCR: NEGATIVE
SARS Coronavirus 2 by RT PCR: NEGATIVE

## 2020-09-01 MED ORDER — DOXYCYCLINE HYCLATE 100 MG PO TABS
100.0000 mg | ORAL_TABLET | Freq: Once | ORAL | Status: AC
  Administered 2020-09-01: 100 mg via ORAL
  Filled 2020-09-01: qty 1

## 2020-09-01 MED ORDER — HYDROCOD POLST-CPM POLST ER 10-8 MG/5ML PO SUER: 5.0000 mL | Freq: Two times a day (BID) | ORAL | 0 refills | Status: DC | PRN

## 2020-09-01 MED ORDER — IOHEXOL 300 MG/ML  SOLN
60.0000 mL | Freq: Once | INTRAMUSCULAR | Status: AC | PRN
  Administered 2020-09-01: 60 mL via INTRAVENOUS

## 2020-09-01 MED ORDER — ALBUTEROL SULFATE HFA 108 (90 BASE) MCG/ACT IN AERS
2.0000 | INHALATION_SPRAY | RESPIRATORY_TRACT | Status: DC | PRN
  Administered 2020-09-01: 2 via RESPIRATORY_TRACT
  Filled 2020-09-01: qty 6.7

## 2020-09-01 MED ORDER — DOXYCYCLINE HYCLATE 100 MG PO CAPS: 100.0000 mg | ORAL_CAPSULE | Freq: Two times a day (BID) | ORAL | 0 refills | Status: DC

## 2020-09-01 MED ORDER — AEROCHAMBER Z-STAT PLUS/MEDIUM MISC
1.0000 | Freq: Once | Status: AC
  Administered 2020-09-01: 1
  Filled 2020-09-01: qty 1

## 2020-09-01 NOTE — ED Provider Notes (Signed)
[facility] EMERGENCY DEPT Provider Note   CSN: [id number] Arrival date & time: 09/01/20  1304     History Chief Complaint  Patient presents with  . Cough    Robin Valencia is a 75 y.o. female.  The history is provided by the patient.  Cough Cough characteristics:  Non-productive Sputum characteristics:  Unable to specify Severity:  Moderate Onset quality:  Sudden Duration:  3 weeks Timing:  Intermittent Progression:  Unchanged Chronicity:  New Smoker: no   Context: sick contacts and upper respiratory infection   Context comment:  Her son and his family had "bronchitis" Relieved by:  Nothing Worsened by:  Nothing Ineffective treatments: azithromycin and tessalon perles. Associated symptoms: chills and sore throat   Associated symptoms: no chest pain, no ear pain, no fever, no rash and no shortness of breath   Risk factors: no recent travel        Past Medical History:  Diagnosis Date  . Arthritis   . Breast cancer of upper-outer quadrant of right female breast ([place])   . Depression   . Eating disorder   . History of stomach ulcers   . Hypothyroidism   . Jaundice   . Pancreatitis 2009  . Personal history of chemotherapy   . Personal history of radiation therapy   . Reflux esophagitis     Patient Active Problem List   Diagnosis Date Noted  . Other fatigue 07/22/2020  . Osteoarthritis of left shoulder 06/23/2019  . Pre-operative cardiovascular examination 06/02/2019  . Labia irritation 07/04/2018  . Rash 10/19/2017  . Genital herpes 07/04/2017  . Encounter for general adult medical examination with abnormal findings 04/26/2017  . Major depressive disorder, recurrent episode, moderate ([place]) 03/19/2016  . Attention and concentration deficit 10/27/2015  . Memory difficulties 10/27/2015  . Hypothyroidism, 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/08/2015  . Depression 04/08/2015  . Reflux esophagitis 04/08/2015  . Malignant neoplasm of upper-outer quadrant of right  breast in female, estrogen receptor negative ([place]) 04/06/2015    Past Surgical History:  Procedure Laterality Date  . BREAST LUMPECTOMY    . BREAST SURGERY    . CESAREAN SECTION    . CESAREAN SECTION WITH BILATERAL TUBAL LIGATION    . CHOLECYSTECTOMY    . EXPLORATORY LAPAROTOMY     pancreatits due to gallbladder surgery  . MASTECTOMY Right   . THYROIDECTOMY    . TONSILLECTOMY    . TOTAL SHOULDER ARTHROPLASTY Left 06/23/2019   Procedure: LEFT TOTAL SHOULDER ARTHROPLASTY;  Surgeon: Marchia Bond, MD;  Location: [facility];  Service: Orthopedics;  Laterality: Left;     OB History   No obstetric history on file.     Family History  Problem Relation Age of Onset  . Arthritis Mother   . Mental illness Mother   . Depression Mother   . Alcohol abuse Father   . Hyperlipidemia Father   . Heart disease Father   . Stroke Father   . Hypertension Father   . Hypertension Sister   . Mental illness Sister   . Breast cancer Sister 57  . Mental illness Brother   . Depression Brother   . Cancer Paternal Grandfather        prostate  . Breast cancer Maternal Aunt     Social History   Tobacco Use  . Smoking status: Never Smoker  . Smokeless tobacco: Never Used  Vaping Use  . Vaping Use: Never used  Substance Use Topics  . Alcohol use: No  . Drug use: No  Home Medications Prior to Admission medications   Medication Sig Start Date End Date Taking? Authorizing Provider  azithromycin (ZITHROMAX) 250 MG tablet 2 tabs po qd x 1 day; 1 tablet per day x 4 days; 08/15/20   Marrian Salvage, FNP  benzonatate (TESSALON) 100 MG capsule Take 1 capsule (100 mg total) by mouth 3 (three) times daily as needed for cough. 08/26/20   Hoyt Koch, MD  brexpiprazole (REXULTI) TABS tablet Take by mouth daily.    [provider]  buPROPion (WELLBUTRIN XL) 150 MG 24 hr tablet Take 150 mg by mouth daily.    [provider]  buPROPion (WELLBUTRIN XL) 300  MG 24 hr tablet Take 1 tablet (300 mg total) by mouth every morning. Take with 150 mg for a total of 450 mg daily 08/01/17 08/01/18  Aundra Dubin, MD  levothyroxine (SYNTHROID) 50 MCG tablet Take 1 tablet (50 mcg total) by mouth daily before breakfast. 07/22/20   Hoyt Koch, MD  Multiple Vitamin (MULTIVITAMIN ADULT PO) Take by mouth.    [provider]  omeprazole (PRILOSEC) 20 MG capsule Take 1 capsule (20 mg total) by mouth 2 (two) times daily. 07/22/20   Hoyt Koch, MD  terbinafine (LAMISIL) 250 MG tablet Take 1 pill daily for 1 week, then skip for 3 weeks, then take 1 pill daily for 1 week, then skip for 3 weeks, then take 1 pill daily for 1 week. Then stop. 07/22/20   Hoyt Koch, MD  triamcinolone (KENALOG) 0.1 % Apply 1 application topically 2 (two) times daily. 07/22/20   Hoyt Koch, MD  valACYclovir (VALTREX) 1000 MG tablet Take 1 tablet (1,000 mg total) by mouth 2 (two) times daily. Patient taking differently: Take 1,000 mg by mouth as needed. 04/18/20   Hoyt Koch, MD  venlafaxine XR (EFFEXOR-XR) 75 MG 24 hr capsule Take 75 mg by mouth daily. 06/27/20   [provider]  vitamin B-12 (CYANOCOBALAMIN) 100 MCG tablet Take 100 mcg by mouth daily.    [provider]    Allergies    Codeine, Gabapentin, and Sulfa antibiotics  Review of Systems   Review of Systems  Constitutional: Positive for chills. Negative for fever.  HENT: Positive for sore throat. Negative for ear pain.   Eyes: Negative for pain and visual disturbance.  Respiratory: Positive for cough. Negative for shortness of breath.   Cardiovascular: Negative for chest pain and palpitations.  Gastrointestinal: Negative for abdominal pain and vomiting.  Genitourinary: Negative for dysuria and hematuria.  Musculoskeletal: Negative for arthralgias and back pain.  Skin: Negative for color change and rash.  Neurological: Negative for seizures and  syncope.  All other systems reviewed and are negative.   Physical Exam Updated Vital Signs BP 130/68 (BP Location: Left Arm)   Pulse 87   Temp 98.4 F (36.9 C)   Resp 18   Ht 5\' 4"  (1.626 m)   Wt 68 kg   SpO2 99%   BMI 25.75 kg/m   Physical Exam Vitals and nursing note reviewed.  Constitutional:      General: She is not in acute distress.    Appearance: She is well-developed.     Comments: Appears uncomfortable  HENT:     Head: Normocephalic and atraumatic.  Eyes:     Conjunctiva/sclera: Conjunctivae normal.  Cardiovascular:     Rate and Rhythm: Normal rate and regular rhythm.     Heart sounds: No murmur heard.   Pulmonary:  Effort: Pulmonary effort is normal. No respiratory distress.     Breath sounds: Normal breath sounds.  Abdominal:     Palpations: Abdomen is soft.     Tenderness: There is no abdominal tenderness.  Musculoskeletal:     Cervical back: Neck supple.  Skin:    General: Skin is warm and dry.  Neurological:     General: No focal deficit present.     Mental Status: She is alert.  Psychiatric:        Mood and Affect: Mood normal.     ED Results / Procedures / Treatments   Labs (all labs ordered are listed, but only abnormal results are displayed) Labs Reviewed  RESP PANEL BY RT-PCR (FLU A&B, COVID) ARPGX2  BASIC METABOLIC PANEL  CBC WITH DIFFERENTIAL/PLATELET    EKG None  Radiology DG Chest 2 View  Result Date: 09/01/2020 CLINICAL DATA:  Cough, shortness of breath EXAM: CHEST - 2 VIEW COMPARISON:  None. FINDINGS: Mild interstitial prominence. No pleural effusion. Normal heart size. Calcified breast implants. Partially imaged left shoulder arthroplasty. IMPRESSION: Nonspecific mild interstitial prominence is most likely chronic. No definite acute process in the chest. Electronically Signed   By: Macy Mis M.D.   On: 09/01/2020 13:49   CT Chest W Contrast  Result Date: 09/01/2020 CLINICAL DATA:  75 year old female with  respiratory illness. EXAM: CT CHEST WITH CONTRAST TECHNIQUE: Multidetector CT imaging of the chest was performed during intravenous contrast administration. CONTRAST:  67mL OMNIPAQUE IOHEXOL 300 MG/ML  SOLN COMPARISON:  Chest radiograph dated 09/01/2020. FINDINGS: Cardiovascular: There is no cardiomegaly or pericardial effusion. The thoracic aorta is unremarkable. The origins of the great vessels of the aortic arch appear patent. The central pulmonary arteries are unremarkable for the degree of opacification. Mediastinum/Nodes: No hilar or mediastinal adenopathy. The esophagus is grossly unremarkable. Probable prior right hemithyroidectomy. No mediastinal fluid collection. Lungs/Pleura: Bibasilar linear atelectasis/scarring. There is a 6 mm subpleural nodule along the lateral right lung base, likely scarring. Attention on follow-up imaging recommended. No focal consolidation, pleural effusion, or pneumothorax. The central airways are patent. Upper Abdomen: Mild fatty liver. Musculoskeletal: Bilateral breast implants with calcified capsule. There may be areas of capsular rupture on the right. Osteopenia with degenerative changes of the spine. No acute osseous pathology. IMPRESSION: No acute intrathoracic pathology. Electronically Signed   By: Anner Crete M.D.   On: 09/01/2020 15:55    Procedures Procedures   Medications Ordered in ED Medications  albuterol (VENTOLIN HFA) 108 (90 Base) MCG/ACT inhaler 2 puff (has no administration in time range)  iohexol (OMNIPAQUE) 300 MG/ML solution 60 mL (60 mLs Intravenous Contrast Given 09/01/20 1529)    ED Course  I have reviewed the triage vital signs and the nursing notes.  Pertinent labs & imaging results that were available during my care of the patient were reviewed by me and considered in my medical decision making (see chart for details).    MDM Rules/Calculators/A&P                          [name] presented with 3 weeks of cough. Symptoms  began as a URI and persisted despite antibiotics. She was evaluated for evidence of pneumonia. No hypoxia.  Final Clinical Impression(s) / ED Diagnoses Final diagnoses:  Cough    Rx / DC Orders ED Discharge Orders    None       Arnaldo Natal, MD 09/01/20 1601

## 2020-09-01 NOTE — ED Triage Notes (Signed)
Patient reports to the ER for SOB. Patient states she has been sick x3 weeks. Patient states on April 9th she started feeling SOB and did a televisit with her PCP on April 10th and was diagnosed with bronchitis and prescribed a z-pack. Patient reports she had a negative self done COVID test today. Patient reports increasing SOB and feeling weak.

## 2020-09-01 NOTE — ED Provider Notes (Signed)
Pt signed out by Dr. Joya Gaskins pending CTA chest.  IMPRESSION:  No acute intrathoracic pathology.     Pt d/c with doxy, albuterol inh + spacer, and tussionex.  Pt is stable for d/c.  Return if worse.  F/u with pcp.     Isla Pence, MD 09/01/20 [phone]

## 2020-09-20 DIAGNOSIS — F410 Panic disorder [episodic paroxysmal anxiety] without agoraphobia: Secondary | ICD-10-CM | POA: Diagnosis not present

## 2020-09-20 DIAGNOSIS — F3181 Bipolar II disorder: Secondary | ICD-10-CM | POA: Diagnosis not present

## 2020-09-26 ENCOUNTER — Other Ambulatory Visit: Payer: Self-pay

## 2020-09-26 ENCOUNTER — Emergency Department (HOSPITAL_BASED_OUTPATIENT_CLINIC_OR_DEPARTMENT_OTHER): Payer: Medicare Other | Admitting: Radiology

## 2020-09-26 ENCOUNTER — Emergency Department (HOSPITAL_BASED_OUTPATIENT_CLINIC_OR_DEPARTMENT_OTHER)
Admission: EM | Admit: 2020-09-26 | Discharge: 2020-09-26 | Disposition: A | Discharge status: Home / Self Care | Payer: Medicare Other | Attending: Emergency Medicine | Admitting: Emergency Medicine

## 2020-09-26 ENCOUNTER — Encounter (HOSPITAL_BASED_OUTPATIENT_CLINIC_OR_DEPARTMENT_OTHER): Payer: Self-pay | Admitting: Obstetrics and Gynecology

## 2020-09-26 DIAGNOSIS — Z20822 Contact with and (suspected) exposure to covid-19: Secondary | ICD-10-CM | POA: Insufficient documentation

## 2020-09-26 DIAGNOSIS — R0602 Shortness of breath: Secondary | ICD-10-CM | POA: Insufficient documentation

## 2020-09-26 DIAGNOSIS — R6883 Chills (without fever): Secondary | ICD-10-CM | POA: Insufficient documentation

## 2020-09-26 DIAGNOSIS — R0789 Other chest pain: Secondary | ICD-10-CM | POA: Insufficient documentation

## 2020-09-26 DIAGNOSIS — R059 Cough, unspecified: Secondary | ICD-10-CM | POA: Diagnosis not present

## 2020-09-26 DIAGNOSIS — Z79899 Other long term (current) drug therapy: Secondary | ICD-10-CM | POA: Diagnosis not present

## 2020-09-26 DIAGNOSIS — E039 Hypothyroidism, unspecified: Secondary | ICD-10-CM | POA: Diagnosis not present

## 2020-09-26 DIAGNOSIS — R062 Wheezing: Secondary | ICD-10-CM | POA: Insufficient documentation

## 2020-09-26 DIAGNOSIS — Z96612 Presence of left artificial shoulder joint: Secondary | ICD-10-CM | POA: Insufficient documentation

## 2020-09-26 DIAGNOSIS — Z853 Personal history of malignant neoplasm of breast: Secondary | ICD-10-CM | POA: Diagnosis not present

## 2020-09-26 LAB — RESP PANEL BY RT-PCR (FLU A&B, COVID) ARPGX2
Influenza A by PCR: NEGATIVE
Influenza B by PCR: NEGATIVE
SARS Coronavirus 2 by RT PCR: NEGATIVE

## 2020-09-26 MED ORDER — PREDNISONE 20 MG PO TABS: ORAL_TABLET | ORAL | 0 refills | Status: DC

## 2020-09-26 NOTE — Discharge Instructions (Signed)
If you develop high fever, severe cough or cough with blood, trouble breathing, severe headache, neck pain/stiffness, vomiting, or any other new/concerning symptoms then return to the ER for evaluation  

## 2020-09-26 NOTE — ED Provider Notes (Signed)
[facility] Provider Note   CSN: [id number] Arrival date & time: 09/26/20  1536     History Chief Complaint  Patient presents with  . Shortness of Breath    Robin Valencia is a 75 y.o. female.  HPI 75 year old female presents with cough.  Originally started last month.  Seem to get partially better with the treatment she was given and then has begun getting worse over the last week or so.  She was given azithromycin by her PCP and then eventually doxycycline in the ED.  She was also given an inhaler.  She has a cough in addition to feeling very congested in her ears and head.  No fevers but she gets chills and sweats.  She feels a little short of breath and sometimes chest tightness.  Past Medical History:  Diagnosis Date  . Arthritis   . Breast cancer of upper-outer quadrant of right female breast ([place])   . Depression   . Eating disorder   . History of stomach ulcers   . Hypothyroidism   . Jaundice   . Pancreatitis 2009  . Personal history of chemotherapy   . Personal history of radiation therapy   . Reflux esophagitis     Patient Active Problem List   Diagnosis Date Noted  . Other fatigue 07/22/2020  . Osteoarthritis of left shoulder 06/23/2019  . Pre-operative cardiovascular examination 06/02/2019  . Labia irritation 07/04/2018  . Rash 10/19/2017  . Genital herpes 07/04/2017  . Encounter for general adult medical examination with abnormal findings 04/26/2017  . Major depressive disorder, recurrent episode, moderate ([place]) 03/19/2016  . Attention and concentration deficit 10/27/2015  . Memory difficulties 10/27/2015  . Hypothyroidism, 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/08/2015  . Depression 04/08/2015  . Reflux esophagitis 04/08/2015  . Malignant neoplasm of upper-outer quadrant of right breast in female, estrogen receptor negative ([place]) 04/06/2015    Past Surgical History:  Procedure Laterality Date  . BREAST LUMPECTOMY    . BREAST SURGERY    .  CESAREAN SECTION    . CESAREAN SECTION WITH BILATERAL TUBAL LIGATION    . CHOLECYSTECTOMY    . EXPLORATORY LAPAROTOMY     pancreatits due to gallbladder surgery  . MASTECTOMY Right   . THYROIDECTOMY    . TONSILLECTOMY    . TOTAL SHOULDER ARTHROPLASTY Left 06/23/2019   Procedure: LEFT TOTAL SHOULDER ARTHROPLASTY;  Surgeon: Marchia Bond, MD;  Location: [facility];  Service: Orthopedics;  Laterality: Left;     OB History    Gravida      Para      Term      Preterm      AB      Living  1     SAB      IAB      Ectopic      Multiple      Live Births              Family History  Problem Relation Age of Onset  . Arthritis Mother   . Mental illness Mother   . Depression Mother   . Alcohol abuse Father   . Hyperlipidemia Father   . Heart disease Father   . Stroke Father   . Hypertension Father   . Hypertension Sister   . Mental illness Sister   . Breast cancer Sister 41  . Mental illness Brother   . Depression Brother   . Cancer Paternal Grandfather        prostate  .  Breast cancer Maternal Aunt     Social History   Tobacco Use  . Smoking status: Never Smoker  . Smokeless tobacco: Never Used  Vaping Use  . Vaping Use: Never used  Substance Use Topics  . Alcohol use: No  . Drug use: No    Home Medications Prior to Admission medications   Medication Sig Start Date End Date Taking? Authorizing Provider  predniSONE (DELTASONE) 20 MG tablet 3 tabs po day one, then 2 po daily x 4 days 09/26/20  Yes Sherwood Gambler, MD  azithromycin (ZITHROMAX) 250 MG tablet 2 tabs po qd x 1 day; 1 tablet per day x 4 days; 08/15/20   Marrian Salvage, FNP  benzonatate (TESSALON) 100 MG capsule Take 1 capsule (100 mg total) by mouth 3 (three) times daily as needed for cough. 08/26/20   Hoyt Koch, MD  brexpiprazole (REXULTI) TABS tablet Take by mouth daily.    [provider]  buPROPion (WELLBUTRIN XL) 150 MG 24 hr tablet Take 150 mg  by mouth daily.    [provider]  buPROPion (WELLBUTRIN XL) 300 MG 24 hr tablet Take 1 tablet (300 mg total) by mouth every morning. Take with 150 mg for a total of 450 mg daily 08/01/17 08/01/18  Eksir, Richard Miu, MD  chlorpheniramine-HYDROcodone [facility]) 10-8 MG/5ML SUER Take 5 mLs by mouth every 12 (twelve) hours as needed for cough. 09/01/20   Isla Pence, MD  doxycycline (VIBRAMYCIN) 100 MG capsule Take 1 capsule (100 mg total) by mouth 2 (two) times daily. 09/01/20   Isla Pence, MD  levothyroxine (SYNTHROID) 50 MCG tablet Take 1 tablet (50 mcg total) by mouth daily before breakfast. 07/22/20   Hoyt Koch, MD  Multiple Vitamin (MULTIVITAMIN ADULT PO) Take by mouth.    [provider]  omeprazole (PRILOSEC) 20 MG capsule Take 1 capsule (20 mg total) by mouth 2 (two) times daily. 07/22/20   Hoyt Koch, MD  terbinafine (LAMISIL) 250 MG tablet Take 1 pill daily for 1 week, then skip for 3 weeks, then take 1 pill daily for 1 week, then skip for 3 weeks, then take 1 pill daily for 1 week. Then stop. 07/22/20   Hoyt Koch, MD  triamcinolone (KENALOG) 0.1 % Apply 1 application topically 2 (two) times daily. 07/22/20   Hoyt Koch, MD  valACYclovir (VALTREX) 1000 MG tablet Take 1 tablet (1,000 mg total) by mouth 2 (two) times daily. Patient taking differently: Take 1,000 mg by mouth as needed. 04/18/20   Hoyt Koch, MD  venlafaxine XR (EFFEXOR-XR) 75 MG 24 hr capsule Take 75 mg by mouth daily. 06/27/20   [provider]  vitamin B-12 (CYANOCOBALAMIN) 100 MCG tablet Take 100 mcg by mouth daily.    [provider]    Allergies    Codeine, Gabapentin, and Sulfa antibiotics  Review of Systems   Review of Systems  Constitutional: Positive for chills. Negative for fever.  HENT: Positive for congestion, ear pain (stopped up) and sore throat.   Respiratory: Positive for cough, chest tightness  and shortness of breath.   Cardiovascular: Negative for chest pain.  All other systems reviewed and are negative.   Physical Exam Updated Vital Signs BP 129/78 (BP Location: Left Arm)   Pulse (!) 103   Temp 98.3 F (36.8 C) (Oral)   Resp (!) 22   Ht 5\' 4"  (1.626 m)   Wt 67.1 kg   SpO2 100%   BMI  25.40 kg/m   Physical Exam Vitals and nursing note reviewed.  Constitutional:      General: She is not in acute distress.    Appearance: She is well-developed. She is not ill-appearing or diaphoretic.  HENT:     Head: Normocephalic and atraumatic.     Right Ear: External ear normal.     Left Ear: External ear normal.     Nose: Nose normal.     Mouth/Throat:     Pharynx: Oropharynx is clear. No pharyngeal swelling or oropharyngeal exudate.  Eyes:     General:        Right eye: No discharge.        Left eye: No discharge.  Cardiovascular:     Rate and Rhythm: Normal rate and regular rhythm.     Heart sounds: Normal heart sounds.  Pulmonary:     Effort: Pulmonary effort is normal. No tachypnea, accessory muscle usage or respiratory distress.     Breath sounds: Wheezing (scattered, faint) present.  Abdominal:     Palpations: Abdomen is soft.     Tenderness: There is no abdominal tenderness.  Musculoskeletal:     Right lower leg: No edema.     Left lower leg: No edema.  Skin:    General: Skin is warm and dry.  Neurological:     Mental Status: She is alert.  Psychiatric:        Mood and Affect: Mood is not anxious.     ED Results / Procedures / Treatments   Labs (all labs ordered are listed, but only abnormal results are displayed) Labs Reviewed  RESP PANEL BY RT-PCR (FLU A&B, COVID) [id number]    EKG None  Radiology DG Chest 2 View  Result Date: 09/26/2020 CLINICAL DATA:  Shortness of breath. EXAM: CHEST - 2 VIEW COMPARISON:  Radiograph and CT 09/01/2020 FINDINGS: The cardiomediastinal contours are normal. Mild bronchial thickening which is similar to prior.  Pulmonary vasculature is normal. No consolidation, pleural effusion, or pneumothorax. No acute osseous abnormalities are seen. Peripherally calcified breast implants again seen. Left humeral arthroplasty. IMPRESSION: No acute chest findings.  Similar bronchial thickening to prior. Electronically Signed   By: Keith Rake M.D.   On: 09/26/2020 16:22    Procedures Procedures   Medications Ordered in ED Medications - No data to display  ED Course  I have reviewed the triage vital signs and the nursing notes.  Pertinent labs & imaging results that were available during my care of the patient were reviewed by me and considered in my medical decision making (see chart for details).    MDM Rules/Calculators/A&P                          Patient symptoms of been ongoing for about a month now.  Unclear why.  She is not a smoker.  She feels like the albuterol sometimes helps so I will recommend she continue this and will try dose of steroids for 5 days.  Otherwise there is no bacterial pneumonia.  COVID testing was obtained from triage and she again check her MyChart for these answers.  Otherwise, my suspicion that she has PE or other acute chest process is pretty low.  Will recommend she follow-up with her PCP as she may need to follow-up with a pulmonologist if not improving. I also recommended she try mucinex. Final Clinical Impression(s) / ED Diagnoses Final diagnoses:  Cough    Rx / DC Orders  ED Discharge Orders         Ordered    predniSONE (DELTASONE) 20 MG tablet        09/26/20 1639           Sherwood Gambler, MD 09/26/20 1642

## 2020-09-26 NOTE — ED Triage Notes (Signed)
Patient reports she was seen here on 4/28 and she was diagnosed with bronchitis and given and antibiotic., Patient reports some improvement but states it has since gotten worse again. Patient reports she is SOB, lethargic, congested and miserable.

## 2020-09-26 NOTE — ED Notes (Signed)
Patient verbalizes understanding of discharge instructions. Opportunity for questioning and answers were provided. Armband removed by staff, pt discharged from ED.  

## 2020-10-11 DIAGNOSIS — F410 Panic disorder [episodic paroxysmal anxiety] without agoraphobia: Secondary | ICD-10-CM | POA: Diagnosis not present

## 2020-10-11 DIAGNOSIS — F3181 Bipolar II disorder: Secondary | ICD-10-CM | POA: Diagnosis not present

## 2020-10-17 DIAGNOSIS — H01132 Eczematous dermatitis of right lower eyelid: Secondary | ICD-10-CM | POA: Diagnosis not present

## 2020-10-17 DIAGNOSIS — H01131 Eczematous dermatitis of right upper eyelid: Secondary | ICD-10-CM | POA: Diagnosis not present

## 2020-10-17 DIAGNOSIS — H16223 Keratoconjunctivitis sicca, not specified as Sjogren's, bilateral: Secondary | ICD-10-CM | POA: Diagnosis not present

## 2020-10-17 DIAGNOSIS — Z961 Presence of intraocular lens: Secondary | ICD-10-CM | POA: Diagnosis not present

## 2020-10-17 DIAGNOSIS — H01134 Eczematous dermatitis of left upper eyelid: Secondary | ICD-10-CM | POA: Diagnosis not present

## 2020-10-17 DIAGNOSIS — H01135 Eczematous dermatitis of left lower eyelid: Secondary | ICD-10-CM | POA: Diagnosis not present

## 2020-10-17 DIAGNOSIS — H43813 Vitreous degeneration, bilateral: Secondary | ICD-10-CM | POA: Diagnosis not present

## 2020-11-01 DIAGNOSIS — F3341 Major depressive disorder, recurrent, in partial remission: Secondary | ICD-10-CM | POA: Diagnosis not present

## 2020-11-01 DIAGNOSIS — F410 Panic disorder [episodic paroxysmal anxiety] without agoraphobia: Secondary | ICD-10-CM | POA: Diagnosis not present

## 2020-11-14 DIAGNOSIS — F3289 Other specified depressive episodes: Secondary | ICD-10-CM | POA: Diagnosis not present

## 2020-11-14 DIAGNOSIS — F3341 Major depressive disorder, recurrent, in partial remission: Secondary | ICD-10-CM | POA: Diagnosis not present

## 2020-11-22 DIAGNOSIS — L309 Dermatitis, unspecified: Secondary | ICD-10-CM | POA: Diagnosis not present

## 2020-11-29 DIAGNOSIS — F410 Panic disorder [episodic paroxysmal anxiety] without agoraphobia: Secondary | ICD-10-CM | POA: Diagnosis not present

## 2020-11-29 DIAGNOSIS — F3341 Major depressive disorder, recurrent, in partial remission: Secondary | ICD-10-CM | POA: Diagnosis not present

## 2020-12-06 ENCOUNTER — Encounter: Payer: Self-pay | Admitting: Internal Medicine

## 2020-12-06 ENCOUNTER — Telehealth (INDEPENDENT_AMBULATORY_CARE_PROVIDER_SITE_OTHER): Payer: Medicare Other | Admitting: Internal Medicine

## 2020-12-06 DIAGNOSIS — R059 Cough, unspecified: Secondary | ICD-10-CM

## 2020-12-06 MED ORDER — DOXYCYCLINE HYCLATE 100 MG PO TABS: 100.0000 mg | ORAL_TABLET | Freq: Two times a day (BID) | ORAL | 0 refills | Status: DC

## 2020-12-06 MED ORDER — FLUTICASONE FUROATE-VILANTEROL 200-25 MCG/INH IN AEPB: 1.0000 | INHALATION_SPRAY | Freq: Every day | RESPIRATORY_TRACT | 2 refills | Status: DC

## 2020-12-06 MED ORDER — PREDNISONE 20 MG PO TABS: 40.0000 mg | ORAL_TABLET | Freq: Every day | ORAL | 0 refills | Status: DC

## 2020-12-06 NOTE — Assessment & Plan Note (Signed)
It is unclear if she has some scarring in the lungs on recent CT chest. Rx breo to use daily for next 1-2 months. Rx doxycycline and prednisone 1 week and follow up in office 2-3 weeks.

## 2020-12-06 NOTE — Progress Notes (Signed)
Virtual Visit via Audio Note  I connected with Robin Valencia on 12/06/20 at 10:20 AM EDT by an audio-only enabled telemedicine application and verified that I am speaking with the correct person using two identifiers.  The patient and the provider were at separate locations throughout the entire encounter. Patient location: home, Provider location: work   I discussed the limitations of evaluation and management by telemedicine and the availability of in person appointments. The patient expressed understanding and agreed to proceed. The patient and the provider were the only parties present for the visit unless noted in HPI below.  History of Present Illness: The patient is a 75 y.o. female with visit for cough since April. Started beginning of July this time but mostly on since April. Started last Thursday with sore throat. Last ER visit end of May given a week of prednisone which helped with energy and felt well for a week. Took 10 days of amoxicillin beginning of July which helped and then within days she was feeling unwell again. She is having breathing problems which are off and on she feels she is breathing shallow with some wheezing if she tries to breathe too deep.   Observations/Objective: A and O times 3, coughing during visit, voice strong and speaking in full sentences  Assessment and Plan: See problem oriented charting  Follow Up Instructions: rx prednisone and doxycycline and rx breo. Follow up visit 2-3 weeks in office  Visit time 11 minutes in non-face to face communication with patient and coordination of care.  I discussed the assessment and treatment plan with the patient. The patient was provided an opportunity to ask questions and all were answered. The patient agreed with the plan and demonstrated an understanding of the instructions.   The patient was advised to call back or seek an in-person evaluation if the symptoms worsen or if the condition fails to improve as  anticipated.  Hoyt Koch, MD

## 2020-12-13 ENCOUNTER — Telehealth: Payer: Self-pay | Admitting: Internal Medicine

## 2020-12-13 DIAGNOSIS — F410 Panic disorder [episodic paroxysmal anxiety] without agoraphobia: Secondary | ICD-10-CM | POA: Diagnosis not present

## 2020-12-13 DIAGNOSIS — F3181 Bipolar II disorder: Secondary | ICD-10-CM | POA: Diagnosis not present

## 2020-12-13 NOTE — Telephone Encounter (Signed)
She can call insurance to see if dulera or advair would be cheaper for her

## 2020-12-13 NOTE — Telephone Encounter (Signed)
See below

## 2020-12-13 NOTE — Telephone Encounter (Signed)
Provider prescribed patient: fluticasone furoate-vilanterol (BREO ELLIPTA) 200-25 MCG/INH AEPB  Patient says after insurance rx was still around $200 & she can not afford it  Wants to know if provider can call in something cheaper for her to pharmacy: [facility] [place], [address] [place]  Phone:  [phone] Fax:  [phone]  Callback if needed [phone]

## 2020-12-14 NOTE — Telephone Encounter (Signed)
Patient called insurance & was advised that the Advair Inhaler would be the best option for her  Please send to pharmacy: [facility] [place], [address] [place]  Phone:             [phone] Fax:     [phone]

## 2020-12-14 NOTE — Telephone Encounter (Signed)
See below

## 2020-12-15 MED ORDER — FLUTICASONE-SALMETEROL 100-50 MCG/ACT IN AEPB: 1.0000 | INHALATION_SPRAY | Freq: Two times a day (BID) | RESPIRATORY_TRACT | 3 refills | Status: DC

## 2020-12-15 NOTE — Telephone Encounter (Signed)
Called the patient. She is aware of the new medication sent to her pharmacy. She has been scheduled for a 3 week f/u on 01/03/2021 at 3:20 pm with Dr. Sharlet Salina. Patient is aware of the appointment date and time. No other questions or concerns.

## 2020-12-15 NOTE — Telephone Encounter (Signed)
Sent in advair, 1 puff twice a day scheduled. Should follow up 3-4 weeks after starting this.

## 2021-01-03 ENCOUNTER — Encounter: Payer: Self-pay | Admitting: Internal Medicine

## 2021-01-03 ENCOUNTER — Other Ambulatory Visit: Payer: Self-pay

## 2021-01-03 ENCOUNTER — Ambulatory Visit (INDEPENDENT_AMBULATORY_CARE_PROVIDER_SITE_OTHER): Payer: Medicare Other | Admitting: Internal Medicine

## 2021-01-03 DIAGNOSIS — F3181 Bipolar II disorder: Secondary | ICD-10-CM | POA: Diagnosis not present

## 2021-01-03 DIAGNOSIS — R059 Cough, unspecified: Secondary | ICD-10-CM | POA: Diagnosis not present

## 2021-01-03 MED ORDER — VALACYCLOVIR HCL 1 G PO TABS: 1000.0000 mg | ORAL_TABLET | Freq: Two times a day (BID) | ORAL | 0 refills | Status: DC

## 2021-01-03 NOTE — Progress Notes (Signed)
   Subjective:   Patient ID: Robin Valencia, female    DOB: [birth year], 75 y.o.   MRN: [id number]  HPI The patient is a 75 YO female coming in for concerns about breathing. Coughing and SOB since illness in April. Not thought to be covid-19. Started using advair a few weeks ago and is doing much better. No coughing and no SOB. Also took course of doxycycline and prednisone at onset which she is not sure if this helped.   Review of Systems  Constitutional: Negative.   HENT: Negative.    Eyes: Negative.   Respiratory:  Negative for cough, chest tightness and shortness of breath.   Cardiovascular:  Negative for chest pain, palpitations and leg swelling.  Gastrointestinal:  Negative for abdominal distention, abdominal pain, constipation, diarrhea, nausea and vomiting.  Musculoskeletal: Negative.   Skin: Negative.   Neurological: Negative.   Psychiatric/Behavioral: Negative.     Objective:  Physical Exam Constitutional:      Appearance: She is well-developed.  HENT:     Head: Normocephalic and atraumatic.  Cardiovascular:     Rate and Rhythm: Normal rate and regular rhythm.  Pulmonary:     Effort: Pulmonary effort is normal. No respiratory distress.     Breath sounds: Normal breath sounds. No wheezing or rales.  Abdominal:     General: Bowel sounds are normal. There is no distension.     Palpations: Abdomen is soft.     Tenderness: There is no abdominal tenderness. There is no rebound.  Musculoskeletal:     Cervical back: Normal range of motion.  Skin:    General: Skin is warm and dry.  Neurological:     Mental Status: She is alert and oriented to person, place, and time.     Coordination: Coordination normal.    Vitals:   01/03/21 1522  BP: 116/70  Pulse: 86  Resp: 18  Temp: 98.6 F (37 C)  TempSrc: Oral  SpO2: 98%  Weight: 153 lb 12.8 oz (69.8 kg)  Height: '5\' 4"'$  (1.626 m)    This visit occurred during the SARS-CoV-2 public health emergency.  Safety protocols were in  place, including screening questions prior to the visit, additional usage of staff PPE, and extensive cleaning of exam room while observing appropriate contact time as indicated for disinfecting solutions.   Assessment & Plan:

## 2021-01-03 NOTE — Patient Instructions (Addendum)
We will have you keep taking advair twice a day until it runs out then refill and take advair 1 puff daily for a month then stop.  If the cough or breathing problems come back, go back to the advair at the previous dose.

## 2021-01-04 NOTE — Assessment & Plan Note (Signed)
Given resolution with advair could have been post infectious bronchitis. She is advised to keep advair BID for another 1-2 weeks then decrease to advair daily for 1 month then try stopping. If recurrence of symptoms return to dose that controlled symptoms and we will stay there for 3 months then try to stop again.

## 2021-01-11 ENCOUNTER — Telehealth: Payer: Self-pay | Admitting: Internal Medicine

## 2021-01-11 NOTE — Telephone Encounter (Signed)
Left message for patient to call me back at [phone] to schedule Medicare Annual Wellness Visit   Last AWV  03/31/19  Please schedule at anytime with [facility] if patient calls the office back.    40 Minutes appointment   Any questions, please call me at [phone]

## 2021-02-07 ENCOUNTER — Other Ambulatory Visit: Payer: Self-pay | Admitting: Internal Medicine

## 2021-03-12 DIAGNOSIS — H6691 Otitis media, unspecified, right ear: Secondary | ICD-10-CM | POA: Diagnosis not present

## 2021-03-22 ENCOUNTER — Telehealth: Payer: Self-pay | Admitting: Internal Medicine

## 2021-03-22 NOTE — Telephone Encounter (Signed)
Patient states she's had diarrhea for x9d. Patient states no otc medication is helping  Patient requesting a rx for diarrhea  Informed patient she will need an appt; provider does not have any ov for this week, offered ov at another [facility] location, patient declined due to only wanting to be seen by her provider  Patient is requesting a call back

## 2021-03-22 NOTE — Telephone Encounter (Signed)
See below

## 2021-03-23 NOTE — Telephone Encounter (Signed)
Needs visit to assess I would recommend with another provider this week.

## 2021-03-23 NOTE — Telephone Encounter (Signed)
Called pt back to offer an appointment at a different location but pt declined. Stated [facility].

## 2021-03-29 ENCOUNTER — Ambulatory Visit: Payer: Medicare Other | Admitting: Internal Medicine

## 2021-04-21 ENCOUNTER — Other Ambulatory Visit: Payer: Self-pay

## 2021-04-21 ENCOUNTER — Telehealth (INDEPENDENT_AMBULATORY_CARE_PROVIDER_SITE_OTHER): Payer: Medicare Other | Admitting: Internal Medicine

## 2021-04-21 ENCOUNTER — Encounter: Payer: Self-pay | Admitting: Internal Medicine

## 2021-04-21 DIAGNOSIS — R051 Acute cough: Secondary | ICD-10-CM | POA: Diagnosis not present

## 2021-04-21 NOTE — Progress Notes (Signed)
Virtual Visit via Audio Note  I connected with Robin Valencia on 04/21/21 at 10:20 AM EST by an audio-only enabled telemedicine application and verified that I am speaking with the correct person using two identifiers.  The patient and the provider were at separate locations throughout the entire encounter. Patient location: home, Provider location: work   I discussed the limitations of evaluation and management by telemedicine and the availability of in person appointments. The patient expressed understanding and agreed to proceed. The patient and the provider were the only parties present for the visit unless noted in HPI below.  History of Present Illness: The patient is a 75 y.o. female with visit for sinus issues. Started Tuesday with symptoms. Started advair Tuesday.   Observations/Objective: A and O times 3  Assessment and Plan: See problem oriented charting  Follow Up Instructions: advised to take covid-19 test, let us know results, outside window for tamiflu so flu testing would not be beneficial  Visit time 8 minutes in non-face to face communication with patient and coordination of care.  I discussed the assessment and treatment plan with the patient. The patient was provided an opportunity to ask questions and all were answered. The patient agreed with the plan and demonstrated an understanding of the instructions.   The patient was advised to call back or seek an in-person evaluation if the symptoms worsen or if the condition fails to improve as anticipated.  Hoyt Koch, MD

## 2021-04-21 NOTE — Assessment & Plan Note (Signed)
Given that she is in window for covid-19 antiviral asked her to take home test and report results to [place]. She is outside window for tamiflu so flu testing would not be beneficial. She started advair at onset of symptoms so will continue for a few weeks. Also advised to continue allergy medication and can use otc products as needed.

## 2021-04-24 ENCOUNTER — Telehealth: Payer: Self-pay

## 2021-04-24 NOTE — Telephone Encounter (Signed)
Pt calling to advise dr crawford of her negative covid results.

## 2021-04-26 ENCOUNTER — Telehealth: Payer: Self-pay | Admitting: Internal Medicine

## 2021-04-26 MED ORDER — AZITHROMYCIN 250 MG PO TABS: ORAL_TABLET | ORAL | 0 refills | Status: AC

## 2021-04-26 NOTE — Telephone Encounter (Signed)
Patient calling in  Had VV 12.16.22 w/ Dr. Sharlet Salina  Patient says she is still having a lot of head congestion & mucus  Says during visit w/ provider they discussed possible antibiotic if needed.. patient says she would like it sent to [facility], [place]  Phone:  [phone] Fax:  [phone]

## 2021-04-26 NOTE — Telephone Encounter (Signed)
Ok this is done 

## 2021-05-18 ENCOUNTER — Emergency Department (HOSPITAL_BASED_OUTPATIENT_CLINIC_OR_DEPARTMENT_OTHER)
Admission: EM | Admit: 2021-05-18 | Discharge: 2021-05-18 | Disposition: A | Discharge status: Home / Self Care | Payer: Medicare Other | Attending: Emergency Medicine | Admitting: Emergency Medicine

## 2021-05-18 ENCOUNTER — Emergency Department (HOSPITAL_BASED_OUTPATIENT_CLINIC_OR_DEPARTMENT_OTHER): Payer: Medicare Other

## 2021-05-18 ENCOUNTER — Other Ambulatory Visit: Payer: Self-pay

## 2021-05-18 ENCOUNTER — Ambulatory Visit (INDEPENDENT_AMBULATORY_CARE_PROVIDER_SITE_OTHER): Payer: Medicare Other | Admitting: Nurse Practitioner

## 2021-05-18 ENCOUNTER — Encounter: Payer: Self-pay | Admitting: Nurse Practitioner

## 2021-05-18 ENCOUNTER — Telehealth: Payer: Self-pay | Admitting: Nurse Practitioner

## 2021-05-18 ENCOUNTER — Encounter (HOSPITAL_BASED_OUTPATIENT_CLINIC_OR_DEPARTMENT_OTHER): Payer: Self-pay | Admitting: Emergency Medicine

## 2021-05-18 VITALS — BP 116/70 | HR 93 | Temp 97.9°F | Ht 64.0 in | Wt 156.4 lb

## 2021-05-18 DIAGNOSIS — R109 Unspecified abdominal pain: Secondary | ICD-10-CM

## 2021-05-18 DIAGNOSIS — R079 Chest pain, unspecified: Secondary | ICD-10-CM | POA: Diagnosis not present

## 2021-05-18 DIAGNOSIS — E039 Hypothyroidism, unspecified: Secondary | ICD-10-CM | POA: Insufficient documentation

## 2021-05-18 DIAGNOSIS — Z20822 Contact with and (suspected) exposure to covid-19: Secondary | ICD-10-CM | POA: Insufficient documentation

## 2021-05-18 DIAGNOSIS — Z79899 Other long term (current) drug therapy: Secondary | ICD-10-CM | POA: Insufficient documentation

## 2021-05-18 DIAGNOSIS — R9431 Abnormal electrocardiogram [ECG] [EKG]: Secondary | ICD-10-CM | POA: Diagnosis not present

## 2021-05-18 DIAGNOSIS — R1013 Epigastric pain: Secondary | ICD-10-CM | POA: Insufficient documentation

## 2021-05-18 DIAGNOSIS — Z853 Personal history of malignant neoplasm of breast: Secondary | ICD-10-CM | POA: Insufficient documentation

## 2021-05-18 LAB — COMPREHENSIVE METABOLIC PANEL
ALT: 29 U/L (ref 0–35)
ALT: 29 U/L (ref 0–44)
AST: 26 U/L (ref 0–37)
AST: 28 U/L (ref 15–41)
Albumin: 4.9 g/dL (ref 3.5–5.2)
Albumin: 4.7 g/dL (ref 3.5–5.0)
Alkaline Phosphatase: 101 U/L (ref 39–117)
Alkaline Phosphatase: 98 U/L (ref 38–126)
Anion gap: 13 (ref 5–15)
BUN: 22 mg/dL (ref 6–23)
BUN: 22 mg/dL (ref 8–23)
CO2: 24 mEq/L (ref 19–32)
CO2: 21 mmol/L — ABNORMAL LOW (ref 22–32)
Calcium: 9.9 mg/dL (ref 8.4–10.5)
Calcium: 9.8 mg/dL (ref 8.9–10.3)
Chloride: 105 mEq/L (ref 96–112)
Chloride: 107 mmol/L (ref 98–111)
Creatinine, Ser: 0.88 mg/dL (ref 0.40–1.20)
Creatinine, Ser: 0.87 mg/dL (ref 0.44–1.00)
GFR, Estimated: >60 mL/min (ref >60)
GFR: 64.98 mL/min (ref >60.00)
Glucose, Bld: 114 mg/dL — ABNORMAL HIGH (ref 70–99)
Glucose, Bld: 112 mg/dL — ABNORMAL HIGH (ref 70–99)
Potassium: 4.1 mEq/L (ref 3.5–5.1)
Potassium: 4.1 mmol/L (ref 3.5–5.1)
Sodium: 139 mEq/L (ref 135–145)
Sodium: 141 mmol/L (ref 135–145)
Total Bilirubin: 1.1 mg/dL (ref 0.2–1.2)
Total Bilirubin: 1.2 mg/dL (ref 0.3–1.2)
Total Protein: 7.7 g/dL (ref 6.0–8.3)
Total Protein: 7.4 g/dL (ref 6.5–8.1)

## 2021-05-18 LAB — DIFFERENTIAL
Abs Immature Granulocytes: 0.02 10*3/uL (ref 0.00–0.07)
Basophils Absolute: 0.1 10*3/uL (ref 0.0–0.1)
Basophils Relative: 1 %
Eosinophils Absolute: 0.1 10*3/uL (ref 0.0–0.5)
Eosinophils Relative: 1 %
Immature Granulocytes: 0 %
Lymphocytes Relative: 31 %
Lymphs Abs: 2.5 10*3/uL (ref 0.7–4.0)
Monocytes Absolute: 0.6 10*3/uL (ref 0.1–1.0)
Monocytes Relative: 8 %
Neutro Abs: 4.9 10*3/uL (ref 1.7–7.7)
Neutrophils Relative %: 59 %

## 2021-05-18 LAB — URINALYSIS, ROUTINE W REFLEX MICROSCOPIC
Bilirubin Urine: NEGATIVE
Glucose, UA: NEGATIVE mg/dL
Hgb urine dipstick: NEGATIVE
Ketones, ur: 15 mg/dL — AB
Nitrite: NEGATIVE
Specific Gravity, Urine: 1.024 (ref 1.005–1.030)
pH: 5.5 (ref 5.0–8.0)

## 2021-05-18 LAB — CBC WITH DIFFERENTIAL/PLATELET
Basophils Absolute: 0.1 10*3/uL (ref 0.0–0.1)
Basophils Relative: 0.8 % (ref 0.0–3.0)
Eosinophils Absolute: 0.2 10*3/uL (ref 0.0–0.7)
Eosinophils Relative: 2.4 % (ref 0.0–5.0)
HCT: 49.4 % — ABNORMAL HIGH (ref 36.0–46.0)
Hemoglobin: 16.6 g/dL — ABNORMAL HIGH (ref 12.0–15.0)
Lymphocytes Relative: 32.7 % (ref 12.0–46.0)
Lymphs Abs: 2.3 10*3/uL (ref 0.7–4.0)
MCHC: 33.6 g/dL (ref 30.0–36.0)
MCV: 91 fl (ref 78.0–100.0)
Monocytes Absolute: 0.8 10*3/uL (ref 0.1–1.0)
Monocytes Relative: 11.4 % (ref 3.0–12.0)
Neutro Abs: 3.7 10*3/uL (ref 1.4–7.7)
Neutrophils Relative %: 52.7 % (ref 43.0–77.0)
Platelets: 248 10*3/uL (ref 150.0–400.0)
RBC: 5.43 Mil/uL — ABNORMAL HIGH (ref 3.87–5.11)
RDW: 13.6 % (ref 11.5–15.5)
WBC: 7 10*3/uL (ref 4.0–10.5)

## 2021-05-18 LAB — CBC
HCT: 46.5 % — ABNORMAL HIGH (ref 36.0–46.0)
Hemoglobin: 16.2 g/dL — ABNORMAL HIGH (ref 12.0–15.0)
MCH: 30.7 pg (ref 26.0–34.0)
MCHC: 34.8 g/dL (ref 30.0–36.0)
MCV: 88.1 fL (ref 80.0–100.0)
Platelets: 233 10*3/uL (ref 150–400)
RBC: 5.28 MIL/uL — ABNORMAL HIGH (ref 3.87–5.11)
RDW: 13.2 % (ref 11.5–15.5)
WBC: 8.3 10*3/uL (ref 4.0–10.5)
nRBC: 0 % (ref 0.0–0.2)

## 2021-05-18 LAB — LIPASE, BLOOD: Lipase: 20 U/L (ref 11–51)

## 2021-05-18 LAB — RESP PANEL BY RT-PCR (FLU A&B, COVID) ARPGX2
Influenza A by PCR: NEGATIVE
Influenza B by PCR: NEGATIVE
SARS Coronavirus 2 by RT PCR: NEGATIVE

## 2021-05-18 LAB — LIPASE: Lipase: 30 U/L (ref 11.0–59.0)

## 2021-05-18 LAB — TROPONIN I (HIGH SENSITIVITY): Troponin I (High Sensitivity): 9 ng/L (ref <18)

## 2021-05-18 LAB — AMYLASE: Amylase: 32 U/L (ref 27–131)

## 2021-05-18 MED ORDER — HYDROCODONE-ACETAMINOPHEN 5-325 MG PO TABS: 1.0000 | ORAL_TABLET | ORAL | 0 refills | Status: DC | PRN

## 2021-05-18 MED ORDER — SODIUM CHLORIDE 0.9 % IV BOLUS
1000.0000 mL | Freq: Once | INTRAVENOUS | Status: AC
Start: 2021-05-18 — End: 2021-05-18
  Administered 2021-05-18: 1000 mL via INTRAVENOUS

## 2021-05-18 MED ORDER — LIDOCAINE VISCOUS HCL 2 % MT SOLN
15.0000 mL | Freq: Once | OROMUCOSAL | Status: AC
  Administered 2021-05-18: 15 mL via ORAL
  Filled 2021-05-18: qty 15

## 2021-05-18 MED ORDER — ALUM & MAG HYDROXIDE-SIMETH 200-200-20 MG/5ML PO SUSP
30.0000 mL | Freq: Once | ORAL | Status: AC
  Administered 2021-05-18: 30 mL via ORAL
  Filled 2021-05-18: qty 30

## 2021-05-18 MED ORDER — SUCRALFATE 1 G PO TABS: 1.0000 g | ORAL_TABLET | Freq: Three times a day (TID) | ORAL | 0 refills | Status: DC

## 2021-05-18 MED ORDER — IOHEXOL 300 MG/ML  SOLN
100.0000 mL | Freq: Once | INTRAMUSCULAR | Status: AC | PRN
  Administered 2021-05-18: 100 mL via INTRAVENOUS

## 2021-05-18 MED ORDER — MORPHINE SULFATE (PF) 4 MG/ML IV SOLN
4.0000 mg | Freq: Once | INTRAVENOUS | Status: AC
  Administered 2021-05-18: 4 mg via INTRAVENOUS
  Filled 2021-05-18: qty 1

## 2021-05-18 MED ORDER — ONDANSETRON HCL 4 MG PO TABS: 4.0000 mg | ORAL_TABLET | ORAL | 0 refills | Status: DC | PRN

## 2021-05-18 MED ORDER — TRAMADOL HCL 50 MG PO TABS: 50.0000 mg | ORAL_TABLET | Freq: Three times a day (TID) | ORAL | 0 refills | Status: DC | PRN

## 2021-05-18 MED ORDER — ONDANSETRON HCL 4 MG/2ML IJ SOLN
4.0000 mg | Freq: Once | INTRAMUSCULAR | Status: AC
Start: 2021-05-18 — End: 2021-05-18
  Administered 2021-05-18: 4 mg via INTRAVENOUS
  Filled 2021-05-18: qty 2

## 2021-05-18 MED ORDER — FAMOTIDINE IN NACL 20-0.9 MG/50ML-% IV SOLN
20.0000 mg | Freq: Once | INTRAVENOUS | Status: AC
  Administered 2021-05-18: 20 mg via INTRAVENOUS

## 2021-05-18 NOTE — Discharge Instructions (Addendum)
Please take the Norco instead of Tramadol.   There are many causes of abdominal pain. Most pain is not serious and goes away, but some pain gets worse, changes, or will not go away. Please return to the emergency department or see your doctor right away if you (or your family member) experience any of the following:  1. Pain that gets worse or moves to just one spot.  2. Pain that gets worse if you cough or sneeze.  3. Pain with going over a bump in the road.  4. Pain that does not get better in 24 hours.  5. Inability to keep down liquids (vomiting)-especially if you are making less urine.  6. Fainting.  7. Blood in the vomit or stool.  8. High fever or shaking chills.  9. Swelling of the abdomen.  10. Any new or worsening problem.      Follow-up Instructions  See your primary care provider if not completely better in the next 2-3 days. Come to the ED if you are unable to see them in this time frame.    Additional Instructions  No alcohol.  No caffeine, aspirin, or cigarettes.   Please return to the emergency department immediately for any new or concerning symptoms, or if you get worse.

## 2021-05-18 NOTE — ED Triage Notes (Signed)
Saw her dr today and was dx with pancreatitis , pt states her back , abd and . Head  pain ,

## 2021-05-18 NOTE — Telephone Encounter (Signed)
I called patient to discuss her blood work today.  Appears to be less likely pancreatitis based on lab results, I am concerned of possible cardiac etiology as she is having nausea and pain in her back.  Because the pain is constant and seems to have been getting progressively worse over the last 2 weeks I recommended she actually go to the emergency department to rule out any acute coronary syndrome etiology.  She tells me she understands and plans on doing this today.

## 2021-05-18 NOTE — ED Provider Notes (Signed)
[facility] Provider Note   CSN: [id number] Arrival date & time: 05/18/21  1611     History  Chief Complaint  Patient presents with   Abdominal Pain    Robin Valencia is a 76 y.o. female.  This is a 76 y.o. female with significant medical history as below, including breast cancer, hypothyroidism, osteoarthritis who presents to the ED with complaint of epigastric pain radiating to her back.  Patient was seen by PCPs office earlier today, presumed to have pancreatitis.  Patient presented to the emergency department as afternoon secondary to ongoing pain.  She was started on tramadol by PCPs office but she went to the pharmacy and the tramadol had yet to be filled so she came to the ER because her pain was ongoing.  Similar symptoms to prior to the pancreatitis in the past.  Prior episode of pancreatitis associated with cholecystectomy.  Patient intermittent alcohol use, no tobacco use, no excessive use of NSAIDs.  She did take Tylenol prior to arrival which did improve her symptoms.  Symptoms have been ongoing for approximately 2 weeks, mildly worsened during this time.  Associate with nausea, poor oral intake.  Burning, cramping sensation to her midepigastrium.  No change to bowel or bladder function.  No difficulty breathing, no increased swelling to lower extremities.  No fevers, chills, sick contacts or recent travel.  She did receive labs at PCPs office this morning, lipase was within normal limits    The history is provided by the patient. No language interpreter was used.  Abdominal Pain Pain location:  Epigastric Pain quality: cramping and gnawing   Pain radiates to:  Back Pain severity:  Mild Onset quality:  Gradual Timing:  Intermittent Chronicity:  Chronic Context: not alcohol use and not diet changes   Associated symptoms: nausea   Associated symptoms: no chest pain, no chills, no constipation, no cough, no diarrhea, no dysuria, no fever, no  hematemesis, no hematochezia, no hematuria, no shortness of breath, no sore throat and no vomiting   Risk factors: no alcohol abuse and no NSAID use      Patient Active Problem List   Diagnosis Date Noted   Cough 12/06/2020   Other fatigue 07/22/2020   Osteoarthritis of left shoulder 06/23/2019   Pre-operative cardiovascular examination 06/02/2019   Encounter for general adult medical examination with abnormal findings 04/26/2017   Major depressive disorder, recurrent episode, moderate (HCC) 03/19/2016   Attention and concentration deficit 10/27/2015   Memory difficulties 10/27/2015   Hypothyroidism, 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/08/2015   Reflux esophagitis 04/08/2015   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 Authorizing Provider  HYDROcodone-acetaminophen (NORCO/VICODIN) 5-325 MG tablet Take 1 tablet by mouth every 4 (four) hours as needed. 05/18/21  Yes Wynona Dove A, DO  ondansetron (ZOFRAN) 4 MG tablet Take 1 tablet (4 mg total) by mouth every 4 (four) hours as needed for nausea or vomiting. 05/18/21  Yes Jeanell Sparrow, DO  alclomethasone (ACLOVATE) 0.05 % cream Apply topically 2 (two) times daily. 02/13/21   [provider]  benzonatate (TESSALON) 100 MG capsule Take  1 capsule (100 mg total) by mouth 3 (three) times daily as needed for cough. Patient not taking: Reported on 05/18/2021 08/26/20   Hoyt Koch, MD  brexpiprazole (REXULTI) TABS tablet Take by mouth daily. Patient not taking: Reported on 05/18/2021    [provider]  buPROPion (WELLBUTRIN XL) 150 MG 24 hr tablet Take 150 mg by mouth daily.    [provider]  buPROPion (WELLBUTRIN XL) 300 MG 24 hr tablet Take 1 tablet (300 mg total) by mouth every morning. Take with 150 mg for a total of 450 mg daily 08/01/17 08/01/18  Aundra Dubin, MD  fluticasone-salmeterol (ADVAIR) 100-50 MCG/ACT AEPB Inhale 1 puff into the lungs 2 (two) times daily. Patient not taking: Reported on 05/18/2021 12/15/20   Hoyt Koch, MD  levothyroxine (SYNTHROID) 50 MCG tablet Take 1 tablet (50 mcg total) by mouth daily before breakfast. 07/22/20   Hoyt Koch, MD  Multiple Vitamin (MULTIVITAMIN ADULT PO) Take by mouth.    [provider]  omeprazole (PRILOSEC) 20 MG capsule TAKE 1 CAPSULE BY MOUTH  TWICE DAILY 02/08/21   Hoyt Koch, MD  terbinafine (LAMISIL) 250 MG tablet Take 1 pill daily for 1 week, then skip for 3 weeks, then take 1 pill daily for 1 week, then skip for 3 weeks, then take 1 pill daily for 1 week. Then stop. Patient not taking: Reported on 05/18/2021 07/22/20   Hoyt Koch, MD  triamcinolone (KENALOG) 0.1 % Apply 1 application topically 2 (two) times daily. Patient not taking: Reported on 05/18/2021 07/22/20   Hoyt Koch, MD  valACYclovir (VALTREX) 1000 MG tablet Take 1 tablet (1,000 mg total) by mouth 2 (two) times daily. Patient not taking: Reported on 05/18/2021 01/03/21   Hoyt Koch, MD  venlafaxine XR (EFFEXOR-XR) 75 MG 24 hr capsule Take 75 mg by mouth daily. 06/27/20   [provider]  vitamin B-12 (CYANOCOBALAMIN) 100 MCG tablet Take 100 mcg by mouth daily.    [provider]      Allergies    Codeine, Gabapentin, and Sulfa antibiotics    Review of Systems   Review of Systems  Constitutional:  Negative for chills and fever.  HENT:  Negative for ear pain and sore throat.   Eyes:  Negative for pain and visual disturbance.  Respiratory:  Negative for  cough and shortness of breath.   Cardiovascular:  Negative for chest pain and palpitations.  Gastrointestinal:  Positive for abdominal pain and nausea. Negative for constipation, diarrhea, hematemesis, hematochezia and vomiting.  Genitourinary:  Negative for dysuria and hematuria.  Musculoskeletal:  Negative for arthralgias and back pain.  Skin:  Negative for color change and rash.  Neurological:  Negative for seizures and syncope.  All other systems reviewed and are negative.  Physical Exam Updated Vital Signs BP 122/68    Pulse 82    Temp 98.3 F (36.8 C)    Resp 15    Ht 5\' 4"  (1.626 m)    Wt 70.9 kg    SpO2 97%    BMI 26.85 kg/m  Physical Exam Vitals and nursing note reviewed.  Constitutional:      General: She is not in acute distress.    Appearance: Normal appearance. She is well-developed.  HENT:     Head: Normocephalic and atraumatic.     Right Ear: External ear normal.     Left Ear: External ear normal.     Nose: Nose normal.  Mouth/Throat:     Mouth: Mucous membranes are moist.  Eyes:     General: No scleral icterus.       Right eye: No discharge.        Left eye: No discharge.  Cardiovascular:     Rate and Rhythm: Normal rate and regular rhythm.     Pulses: Normal pulses.     Heart sounds: Normal heart sounds.  Pulmonary:     Effort: Pulmonary effort is normal. No respiratory distress.     Breath sounds: Normal breath sounds.  Abdominal:     General: Abdomen is flat. There is no distension.     Palpations: Abdomen is soft.     Tenderness: There is abdominal tenderness in the right upper quadrant and epigastric area. There is no guarding.  Musculoskeletal:        General: Normal range of motion.     Cervical back: Normal range of motion.     Right lower leg: No edema.     Left lower leg: No edema.  Skin:    General: Skin is warm and dry.     Capillary Refill: Capillary refill takes less than 2 seconds.  Neurological:     Mental Status: She is alert and  oriented to person, place, and time.     GCS: GCS eye subscore is 4. GCS verbal subscore is 5. GCS motor subscore is 6.  Psychiatric:        Mood and Affect: Mood normal.        Behavior: Behavior normal.    ED Results / Procedures / Treatments   Labs (all labs ordered are listed, but only abnormal results are displayed) Labs Reviewed  COMPREHENSIVE METABOLIC PANEL - Abnormal; Notable for the following components:      Result Value   CO2 21 (*)    Glucose, Bld 112 (*)    All other components within normal limits  CBC - Abnormal; Notable for the following components:   RBC 5.28 (*)    Hemoglobin 16.2 (*)    HCT 46.5 (*)    All other components within normal limits  URINALYSIS, ROUTINE W REFLEX MICROSCOPIC - Abnormal; Notable for the following components:   Ketones, ur 15 (*)    Protein, ur TRACE (*)    Leukocytes,Ua SMALL (*)    All other components within normal limits  RESP PANEL BY RT-PCR (FLU A&B, COVID) ARPGX2  LIPASE, BLOOD  DIFFERENTIAL  TROPONIN I (HIGH SENSITIVITY)    EKG EKG Interpretation  Date/Time:  Thursday May 18 2021 16:31:59 EST Ventricular Rate:  99 PR Interval:  126 QRS Duration: 76 QT Interval:  344 QTC Calculation: 441 R Axis:   64 Text Interpretation: Normal sinus rhythm Nonspecific ST and T wave abnormality Abnormal ECG When compared with ECG of 26-Sep-2020 15:49, similar to prior Confirmed by Wynona Dove ([id number]) on 05/18/2021 8:15:00 PM  Radiology CT ABDOMEN PELVIS W CONTRAST  Result Date: 05/18/2021 CLINICAL DATA:  Abdominal pain. Right upper quadrant and epigastric pain. EXAM: CT ABDOMEN AND PELVIS WITH CONTRAST TECHNIQUE: Multidetector CT imaging of the abdomen and pelvis was performed using the standard protocol following bolus administration of intravenous contrast. CONTRAST:  191mL OMNIPAQUE IOHEXOL 300 MG/ML  SOLN COMPARISON:  None. FINDINGS: Lower chest: Lung bases are clear. No effusions. Heart is normal size. Hepatobiliary: Prior  cholecystectomy. No focal hepatic abnormality. Intrahepatic and extrahepatic biliary ductal dilatation. Common bile duct measures 14 mm proximally. Pancreas: No focal abnormality or ductal dilatation. Spleen: No  focal abnormality.  Normal size. Adrenals/Urinary Tract: No adrenal abnormality. No focal renal abnormality. No stones or hydronephrosis. Urinary bladder is unremarkable. Stomach/Bowel: Normal appendix. Stomach, large and small bowel grossly unremarkable. Vascular/Lymphatic: No evidence of aneurysm or adenopathy. Reproductive: No visible focal abnormality. Other: No free fluid or free air. Musculoskeletal: No acute bony abnormality. IMPRESSION: No acute findings. Prominent intrahepatic and extrahepatic biliary ducts status post cholecystectomy. This may be related to age and post cholecystectomy state. Recommend correlation with LFTs. Electronically Signed   By: Rolm Baptise M.D.   On: 05/18/2021 19:41   DG Chest Portable 1 View  Result Date: 05/18/2021 CLINICAL DATA:  Midepigastric pain. EXAM: PORTABLE CHEST 1 VIEW COMPARISON:  Chest x-ray 09/26/2020. FINDINGS: The heart size and mediastinal contours are within normal limits. Both lungs are clear. Left shoulder arthroplasty is present. No acute fractures are seen. Bilateral peripherally calcified breast implants are again noted. IMPRESSION: No active disease. Electronically Signed   By: Ronney Asters M.D.   On: 05/18/2021 18:17    Procedures Procedures    Medications Ordered in ED Medications  sodium chloride 0.9 % bolus 1,000 mL ( Intravenous Stopped 05/18/21 1914)  famotidine (PEPCID) IVPB 20 mg premix (0 mg Intravenous Stopped 05/18/21 1848)  alum & mag hydroxide-simeth (MAALOX/MYLANTA) 200-200-20 MG/5ML suspension 30 mL (30 mLs Oral Given 05/18/21 1812)    And  lidocaine (XYLOCAINE) 2 % viscous mouth solution 15 mL (15 mLs Oral Given 05/18/21 1812)  morphine 4 MG/ML injection 4 mg (4 mg Intravenous Given 05/18/21 1810)  ondansetron (ZOFRAN)  injection 4 mg (4 mg Intravenous Given 05/18/21 1808)  iohexol (OMNIPAQUE) 300 MG/ML solution 100 mL (100 mLs Intravenous Contrast Given 05/18/21 1920)    ED Course/ Medical Decision Making/ A&P                           Medical Decision Making   CC: Abdominal pain  This patient complains of above; this involves an extensive number of treatment options and is a complaint that carries with it a high risk of complications and morbidity. Vital signs were reviewed. Serious etiologies considered.  Record review:   Previous records obtained and reviewed   Work up as above, notable for:  Labs & imaging results that were available during my care of the patient were reviewed by me and considered in my medical decision making.   I ordered imaging studies which included chest x-ray, CT abdomen pelvis with IV contrast and I independently visualized and interpreted imaging which showed prominent biliary ducts, otherwise no acute process.  LFTs within normal limits.  Burtis Junes this is normal age-related variant.  Chest x-ray is nonacute.  EKG without evidence of acute ischemia.  Labs stable.   Management: IV fluids, GI cocktail, analgesics, antiemetics  Reassessment:  Patient reports her symptoms have resolved entirely.  She is able tolerate oral intake without difficulty.  She is ambulatory.  Repeat abdominal exam is soft, nontender, nonperitoneal.  Patient with intermittent alcohol use, advised her to not drink alcohol in the future.  The patient's overall condition has improved, the patient presents with abdominal pain without signs of peritonitis, or other life-threatening serious etiology. The patient understands that at this time there is no evidence for a more malignant underlying process, but the patient also understands that early in the process of an illness, an emergency department workup can be falsely reassuring. Detailed discussions were had with the patient regarding current findings,  and need  for close f/u with PCP or on call doctor. The patient appears stable for discharge and has been instructed to return immediately if the symptoms worsen in any way for re-evaluation. Patient verbalized understanding and is in agreement with current care plan.  All questions answered prior to discharge.   Cardiac monitoring reviewed by myself demonstrates normal sinus rhythm   This chart was dictated using voice recognition software.  Despite best efforts to proofread,  errors can occur which can change the documentation meaning. Final Clinical Impression(s) / ED Diagnoses Final diagnoses:  Abdominal pain, unspecified abdominal location    Rx / DC Orders ED Discharge Orders          Ordered    ondansetron (ZOFRAN) 4 MG tablet  Every 4 hours PRN        05/18/21 2012    HYDROcodone-acetaminophen (NORCO/VICODIN) 5-325 MG tablet  Every 4 hours PRN        05/18/21 2012              Jeanell Sparrow, DO 05/18/21 2016

## 2021-05-18 NOTE — Progress Notes (Signed)
Subjective:  Patient ID: Robin Valencia, female    DOB: 07-27-1945  Age: 76 y.o. MRN: [id number]  CC:  Chief Complaint  Patient presents with   Back Pain    Patient describes her symptoms as the same from when she had pancreatis.       HPI  This patient arrives today for the above.  She tells me approximately 2 weeks ago she started having thoracic back pain.  It then progressed to also having abdominal pain with nausea.  She tells me she had pancreatitis approximately 15 years ago and current symptoms remind her of that time as well.  She reports pain as dull and achy, it is constant.  She rates it as an 8-9 out of 10.  She tells me anytime she eats or drinks the pain worsens and also her nausea worsens.  She has been taking Tylenol as needed without improvement in her symptoms.  Past Medical History:  Diagnosis Date   Arthritis    Breast cancer of upper-outer quadrant of right female breast ([place])    Depression    Eating disorder    History of stomach ulcers    Hypothyroidism    Jaundice    Pancreatitis 2009   Personal history of chemotherapy    Personal history of radiation therapy    Reflux esophagitis       Family History  Problem Relation Age of Onset   Arthritis Mother    Mental illness Mother    Depression Mother    Alcohol abuse Father    Hyperlipidemia Father    Heart disease Father    Stroke Father    Hypertension Father    Hypertension Sister    Mental illness Sister    Breast cancer Sister 68   Mental illness Brother    Depression Brother    Cancer Paternal Grandfather        prostate   Breast cancer Maternal Aunt     Social History   Social History Narrative   Not on file   Social History   Tobacco Use   Smoking status: Never   Smokeless tobacco: Never  Substance Use Topics   Alcohol use: No     Current Meds  Medication Sig   buPROPion (WELLBUTRIN XL) 150 MG 24 hr tablet Take 150 mg by mouth daily.   levothyroxine (SYNTHROID)  50 MCG tablet Take 1 tablet (50 mcg total) by mouth daily before breakfast.   Multiple Vitamin (MULTIVITAMIN ADULT PO) Take by mouth.   omeprazole (PRILOSEC) 20 MG capsule TAKE 1 CAPSULE BY MOUTH  TWICE DAILY   traMADol (ULTRAM) 50 MG tablet Take 1 tablet (50 mg total) by mouth every 8 (eight) hours as needed for up to 3 days.   venlafaxine XR (EFFEXOR-XR) 75 MG 24 hr capsule Take 75 mg by mouth daily.   vitamin B-12 (CYANOCOBALAMIN) 100 MCG tablet Take 100 mcg by mouth daily.    ROS:  Review of Systems  Constitutional:  Negative for fever.       (+) feelings of hot flashes  Respiratory:  Positive for shortness of breath (very mild).   Cardiovascular:  Negative for chest pain and palpitations.  Gastrointestinal:  Positive for abdominal pain, diarrhea and nausea. Negative for constipation and vomiting.  Musculoskeletal:  Positive for back pain (two weeks).  Neurological:  Positive for dizziness.    Objective:   Today's Vitals: BP 116/70 (BP Location: Left Arm, Patient Position: Sitting, Cuff Size: Normal)  Pulse 93    Temp 97.9 F (36.6 C) (Oral)    Ht 5\' 4"  (1.626 m)    Wt 156 lb 6.4 oz (70.9 kg)    SpO2 97%    BMI 26.85 kg/m  Vitals with BMI 05/18/2021 01/03/2021 09/26/2020  Height 5\' 4"  5\' 4"  -  Weight 156 lbs 6 oz 153 lbs 13 oz -  BMI 40.98 11.91 -  Systolic 478 295 621  Diastolic 70 70 82  Pulse 93 86 86  Some encounter information is confidential and restricted. Go to Review Flowsheets activity to see all data.     Physical Exam Vitals reviewed.  Constitutional:      General: She is not in acute distress.    Appearance: Normal appearance.  HENT:     Head: Normocephalic and atraumatic.  Neck:     Vascular: No carotid bruit.  Cardiovascular:     Rate and Rhythm: Normal rate and regular rhythm.     Pulses: Normal pulses.     Heart sounds: Normal heart sounds.  Pulmonary:     Effort: Pulmonary effort is normal.     Breath sounds: Normal breath sounds.  Abdominal:      General: Abdomen is flat. Bowel sounds are normal. There is no distension.     Palpations: Abdomen is soft. There is no hepatomegaly or splenomegaly.     Tenderness: There is abdominal tenderness in the right upper quadrant and epigastric area. There is no guarding or rebound.  Skin:    General: Skin is warm and dry.  Neurological:     General: No focal deficit present.     Mental Status: She is alert and oriented to person, place, and time.  Psychiatric:        Mood and Affect: Mood normal.        Behavior: Behavior normal.        Judgment: Judgment normal.         Assessment and Plan   1. Abdominal pain, unspecified abdominal location      Plan: 1.  Vital signs are stable, patient is afebrile.  Based on clinical exam no signs of organ dysfunction or failure, however we will order blood work today for further evaluation. She was encouraged to avoid food for the next 24 hours but to drink liquids such as water and clear broths.  If pain starts to improve in 24 hours she can try eating easily digestible foods such as dry toast or crackers.  If pain returns or worsens with eating she should go back to clear liquids.  We will also order CT scan of the abdomen for further evaluation in case this is not pancreatitis.  Will treat pain with tramadol as needed.  She was told if symptoms worsen in any way especially if she experiences more severe pain or inability to drink liquids without vomiting that she needs to go to the emergency department.  We also discussed signs and symptoms of dehydration such as worsening dizziness, fatigue, cardiac palpitations, and/or overall malaise and if these were to occur she is to go to the emergency department as well.  She tells me she is aware and will proceed there if necessary.  I also told her pending blood work results may recommend she go there anyway, she tells me she understands.  We will have her follow-up in 1 to 2 weeks for close monitoring or sooner  as needed.   Tests ordered Orders Placed This Encounter  Procedures  CT ABDOMEN PELVIS W WO CONTRAST   CBC with Differential/Platelet   Comprehensive metabolic panel   Amylase   Lipase      Meds ordered this encounter  Medications   traMADol (ULTRAM) 50 MG tablet    Sig: Take 1 tablet (50 mg total) by mouth every 8 (eight) hours as needed for up to 3 days.    Dispense:  15 tablet    Refill:  0    Order Specific Question:   Supervising Provider    Answer:   Binnie Rail [id number]    Patient to follow-up in 1 to 2 weeks or sooner as needed.  Ailene Ards, NP

## 2021-05-26 ENCOUNTER — Other Ambulatory Visit: Payer: Self-pay

## 2021-05-26 ENCOUNTER — Encounter: Payer: Self-pay | Admitting: Nurse Practitioner

## 2021-05-26 ENCOUNTER — Ambulatory Visit (INDEPENDENT_AMBULATORY_CARE_PROVIDER_SITE_OTHER): Payer: Medicare Other | Admitting: Nurse Practitioner

## 2021-05-26 VITALS — BP 118/74 | HR 92 | Temp 98.2°F | Ht 64.0 in | Wt 157.0 lb

## 2021-05-26 DIAGNOSIS — R109 Unspecified abdominal pain: Secondary | ICD-10-CM

## 2021-05-26 DIAGNOSIS — D582 Other hemoglobinopathies: Secondary | ICD-10-CM

## 2021-05-26 NOTE — Progress Notes (Signed)
Subjective:  Patient ID: Robin Valencia, female    DOB: 11-27-1945  Age: 76 y.o. MRN: [id number]  CC:  Chief Complaint  Patient presents with   Follow-up   Abdominal Pain      HPI  This patient arrives today for the above.  She is here for close follow-up and monitoring of her abdominal pain.  She was seen by myself originally back on 05/18/2021 and then ultimately ended up in the emergency department that evening.  She was concerned that she may have had pancreatitis, work-up by myself as well as in the emergency department did not show any specific acute pathology.  Today, she reports abdominal pain has resolved.  She does still feel a bit fatigued, but overall feels like she is back to normal.  Past Medical History:  Diagnosis Date   Arthritis    Breast cancer of upper-outer quadrant of right female breast ([place])    Depression    Eating disorder    History of stomach ulcers    Hypothyroidism    Jaundice    Pancreatitis 2009   Personal history of chemotherapy    Personal history of radiation therapy    Reflux esophagitis       Family History  Problem Relation Age of Onset   Arthritis Mother    Mental illness Mother    Depression Mother    Alcohol abuse Father    Hyperlipidemia Father    Heart disease Father    Stroke Father    Hypertension Father    Hypertension Sister    Mental illness Sister    Breast cancer Sister 28   Mental illness Brother    Depression Brother    Cancer Paternal Grandfather        prostate   Breast cancer Maternal Aunt     Social History   Social History Narrative   Not on file   Social History   Tobacco Use   Smoking status: Never   Smokeless tobacco: Never  Substance Use Topics   Alcohol use: No     Current Meds  Medication Sig   alclomethasone (ACLOVATE) 0.05 % cream Apply topically 2 (two) times daily.   buPROPion (WELLBUTRIN XL) 150 MG 24 hr tablet Take 150 mg by mouth daily.   fluticasone-salmeterol (ADVAIR)  100-50 MCG/ACT AEPB Inhale 1 puff into the lungs 2 (two) times daily.   HYDROcodone-acetaminophen (NORCO/VICODIN) 5-325 MG tablet Take 1 tablet by mouth every 4 (four) hours as needed.   levothyroxine (SYNTHROID) 50 MCG tablet Take 1 tablet (50 mcg total) by mouth daily before breakfast.   Multiple Vitamin (MULTIVITAMIN ADULT PO) Take by mouth.   omeprazole (PRILOSEC) 20 MG capsule TAKE 1 CAPSULE BY MOUTH  TWICE DAILY   ondansetron (ZOFRAN) 4 MG tablet Take 1 tablet (4 mg total) by mouth every 4 (four) hours as needed for nausea or vomiting.   valACYclovir (VALTREX) 1000 MG tablet Take 1 tablet (1,000 mg total) by mouth 2 (two) times daily.   venlafaxine XR (EFFEXOR-XR) 75 MG 24 hr capsule Take 75 mg by mouth daily.   vitamin B-12 (CYANOCOBALAMIN) 100 MCG tablet Take 100 mcg by mouth daily.    ROS:  Review of Systems  Constitutional:  Positive for malaise/fatigue. Negative for fever.  Gastrointestinal:  Negative for abdominal pain, blood in stool, constipation, diarrhea, nausea and vomiting.  Genitourinary:  Negative for dysuria and hematuria.    Objective:   Today's Vitals: BP 118/74 (BP Location: Left Arm,  Patient Position: Sitting, Cuff Size: Normal)    Pulse 92    Temp 98.2 F (36.8 C) (Oral)    Ht 5\' 4"  (1.626 m)    Wt 157 lb (71.2 kg)    SpO2 97%    BMI 26.95 kg/m  Vitals with BMI 05/26/2021 05/18/2021 05/18/2021  Height 5\' 4"  - -  Weight 157 lbs - -  BMI 76.54 - -  Systolic 650 354 656  Diastolic 74 68 68  Pulse 92 85 82  Some encounter information is confidential and restricted. Go to Review Flowsheets activity to see all data.     Physical Exam Vitals reviewed.  Constitutional:      General: She is not in acute distress.    Appearance: Normal appearance.  HENT:     Head: Normocephalic and atraumatic.  Neck:     Vascular: No carotid bruit.  Cardiovascular:     Rate and Rhythm: Normal rate and regular rhythm.     Pulses: Normal pulses.     Heart sounds: Normal heart  sounds.  Pulmonary:     Effort: Pulmonary effort is normal.     Breath sounds: Normal breath sounds.  Skin:    General: Skin is warm and dry.  Neurological:     General: No focal deficit present.     Mental Status: She is alert and oriented to person, place, and time.  Psychiatric:        Mood and Affect: Mood normal.        Behavior: Behavior normal.        Judgment: Judgment normal.         Assessment and Plan   1. Abdominal pain, unspecified abdominal location   2. Elevated hemoglobin (HCC)      Plan: 1., 2.  Seems to have resolved.  I did discuss with her that her hemoglobin was slightly elevated when checked by myself as well as in the emergency department.  Recommend that this be followed periodically, she tells me her understanding.  She was also encouraged to let me know if fatigue persists or worsens at which point she can undergo further evaluation.  She tells me she understands.  She is encouraged to follow-up with primary care provider within the next few months, or sooner as needed.   Tests ordered No orders of the defined types were placed in this encounter.     No orders of the defined types were placed in this encounter.   Patient to follow-up in 2 to 3 months, or sooner as needed.  Ailene Ards, NP

## 2021-05-31 ENCOUNTER — Other Ambulatory Visit: Payer: Self-pay | Admitting: Internal Medicine

## 2021-05-31 DIAGNOSIS — E890 Postprocedural hypothyroidism: Secondary | ICD-10-CM

## 2021-06-11 IMAGING — CT CT CHEST W/ CM
2 of 4 series · 15 of 36 positions shown, 18 images · IV contrast (omnipaque)
Comparison: Chest radiograph dated 09/01/2020.

CLINICAL DATA: 75-year-old female with respiratory illness.

EXAM:
CT CHEST WITH CONTRAST
TECHNIQUE: Multidetector CT imaging of the chest was performed during
intravenous contrast administration.
CONTRAST:  60mL OMNIPAQUE IOHEXOL 300 MG/ML  SOLN

[Series 2: routine chest with · axial · 0.71mm/px · z∈[+1320,+1566]mm · 12 of 147 slices shown, 15 images]
[im 12/147  mediastinal]
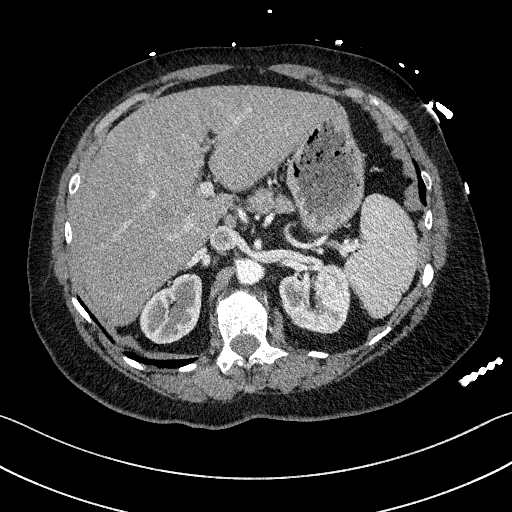
[im 12/147  lung]
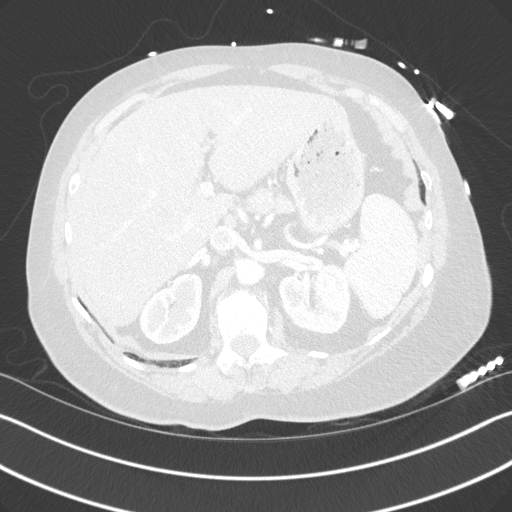
[im 23/147  lung]
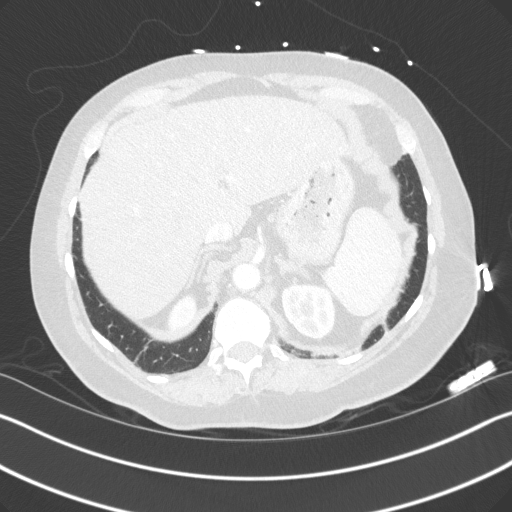
[im 34/147  lung]
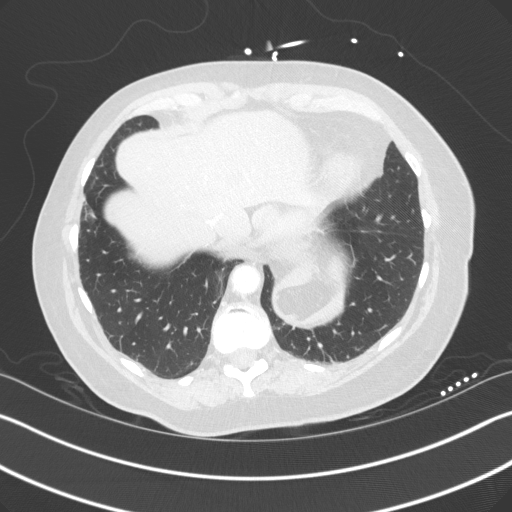
[im 45/147  lung]
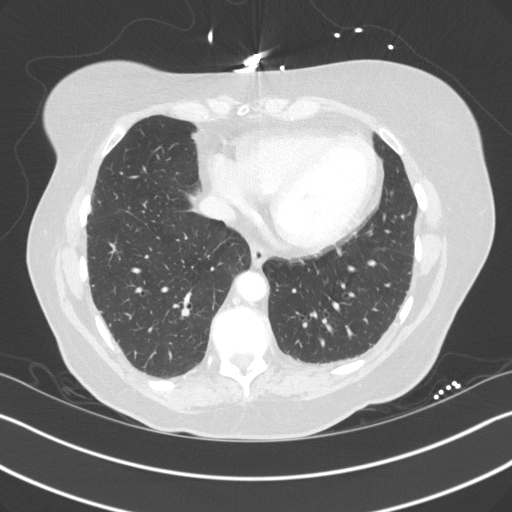
[im 57/147  mediastinal]
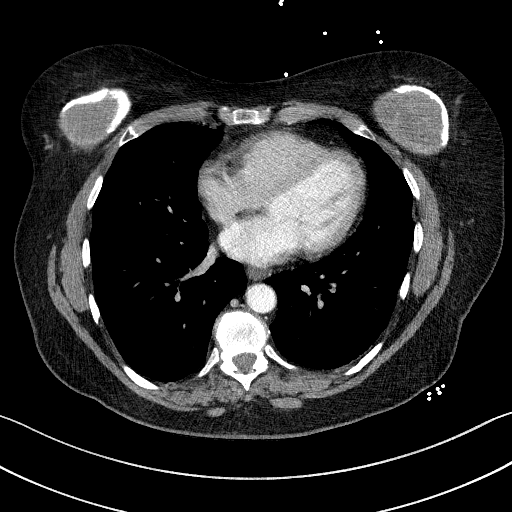
[im 57/147  lung]
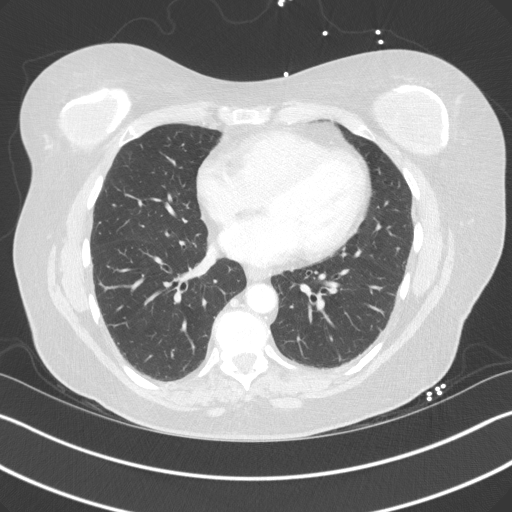
[im 68/147  lung]
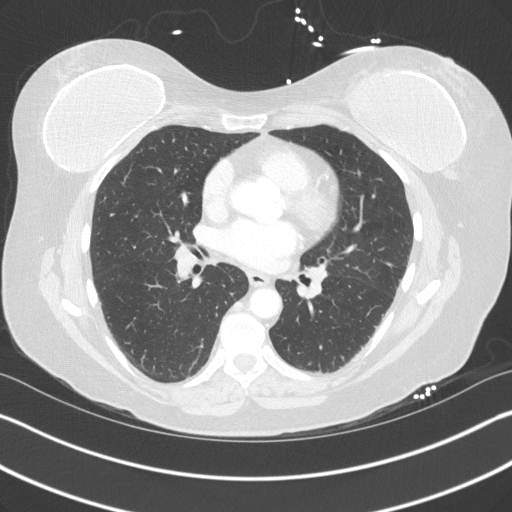
[im 79/147  lung]
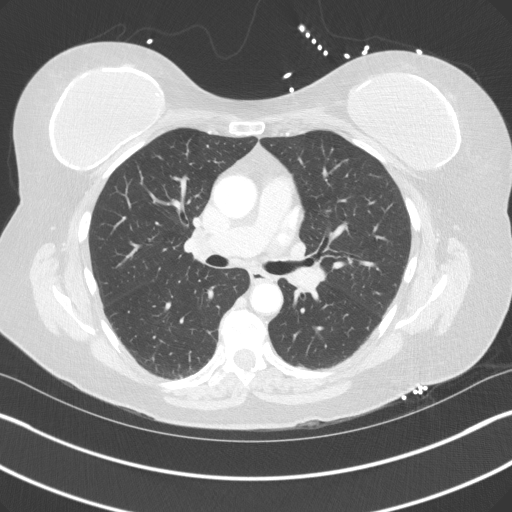
[im 90/147  lung]
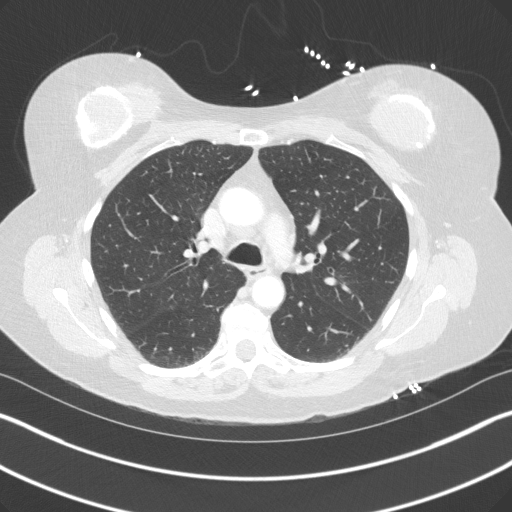
[im 102/147  mediastinal]
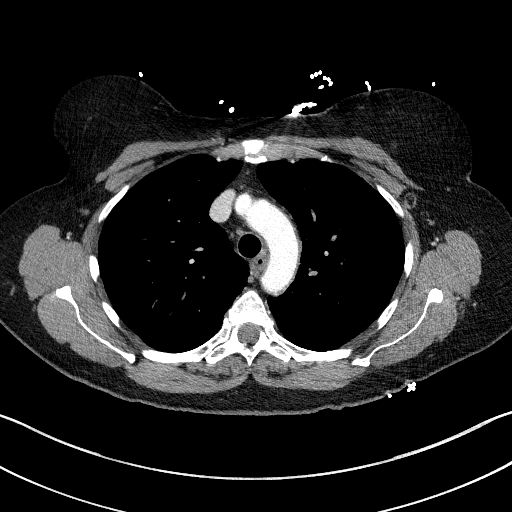
[im 102/147  lung]
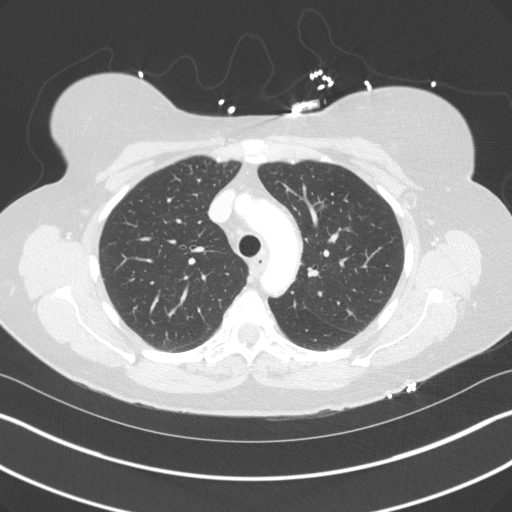
[im 113/147  lung]
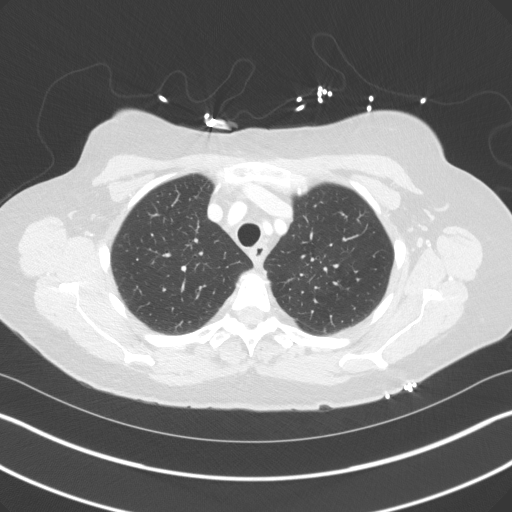
[im 124/147  lung]
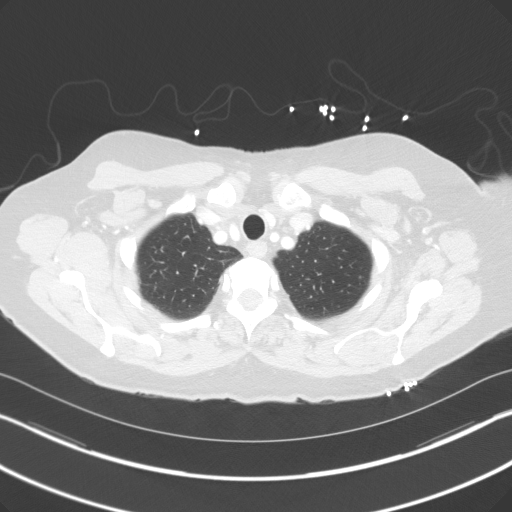
[im 135/147  lung]
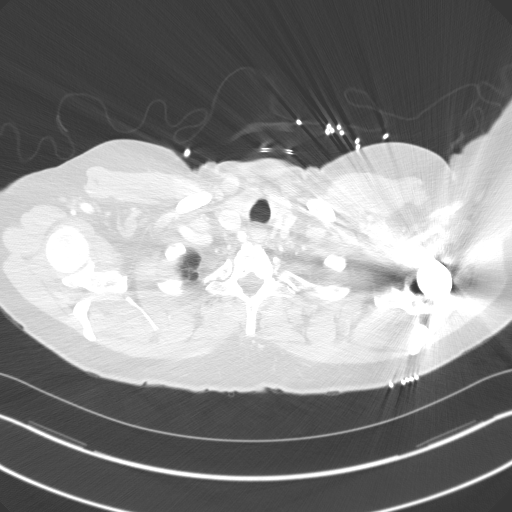

[Series 5: coronal · coronal · 0.60mm/px · 3 of 152 slices shown]
[im 31/152  lung]
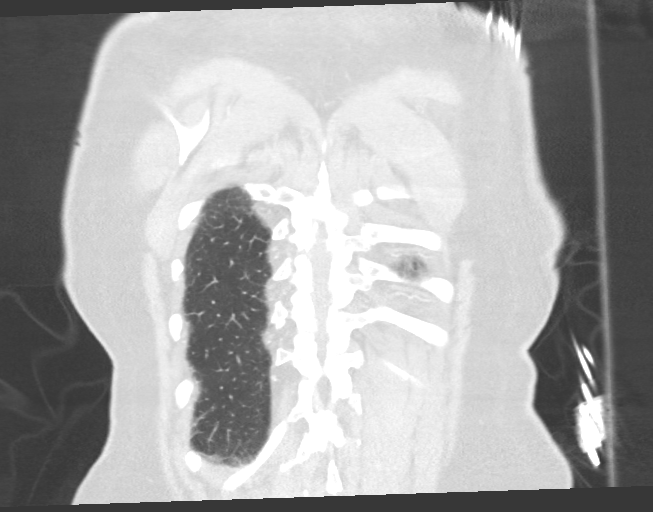
[im 61/152  lung]
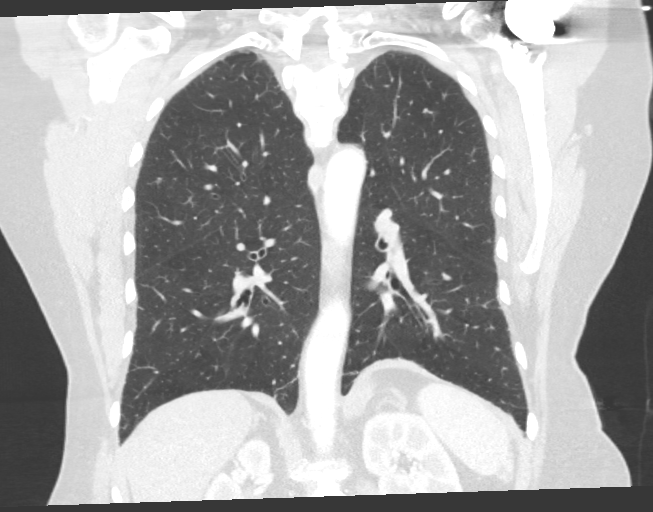
[im 91/152  lung]
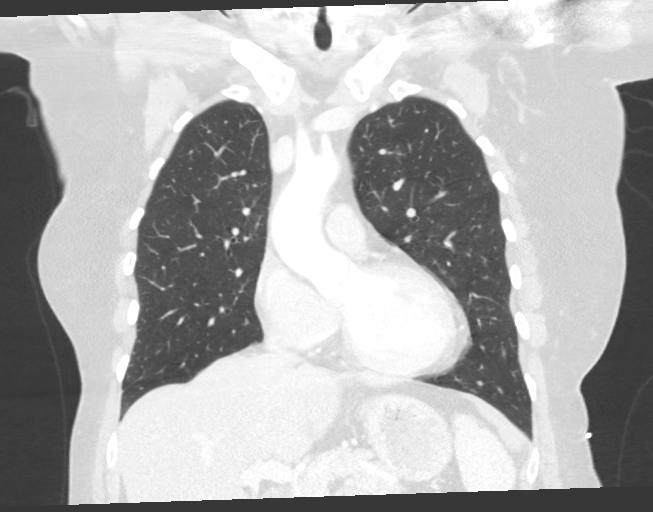

[15 of 36 positions shown; findings below may reference images not displayed]

FINDINGS: Cardiovascular: There is no cardiomegaly or pericardial effusion.
The thoracic aorta is unremarkable. The origins of the great vessels
of the aortic arch appear patent. The central pulmonary arteries are
unremarkable for the degree of opacification.

Mediastinum/Nodes: No hilar or mediastinal adenopathy. The esophagus
is grossly unremarkable. Probable prior right hemithyroidectomy. No
mediastinal fluid collection.

Lungs/Pleura: Bibasilar linear atelectasis/scarring. There is a 6 mm
subpleural nodule along the lateral right lung base, likely
scarring. Attention on follow-up imaging recommended. No focal
consolidation, pleural effusion, or pneumothorax. The central
airways are patent.

Upper Abdomen: Mild fatty liver.

Musculoskeletal: Bilateral breast implants with calcified capsule.
There may be areas of capsular rupture on the right. Osteopenia with
degenerative changes of the spine. No acute osseous pathology.
IMPRESSION: No acute intrathoracic pathology.

## 2021-06-20 DIAGNOSIS — F3181 Bipolar II disorder: Secondary | ICD-10-CM | POA: Diagnosis not present

## 2021-07-18 DIAGNOSIS — F3181 Bipolar II disorder: Secondary | ICD-10-CM | POA: Diagnosis not present

## 2021-07-18 DIAGNOSIS — F410 Panic disorder [episodic paroxysmal anxiety] without agoraphobia: Secondary | ICD-10-CM | POA: Diagnosis not present

## 2021-07-25 ENCOUNTER — Other Ambulatory Visit: Payer: Self-pay

## 2021-07-25 ENCOUNTER — Ambulatory Visit (INDEPENDENT_AMBULATORY_CARE_PROVIDER_SITE_OTHER): Payer: Medicare Other | Admitting: Internal Medicine

## 2021-07-25 ENCOUNTER — Encounter: Payer: Self-pay | Admitting: Internal Medicine

## 2021-07-25 DIAGNOSIS — N3944 Nocturnal enuresis: Secondary | ICD-10-CM | POA: Diagnosis not present

## 2021-07-25 MED ORDER — MIRABEGRON ER 50 MG PO TB24: 50.0000 mg | ORAL_TABLET | Freq: Every day | ORAL | 2 refills | Status: DC

## 2021-07-25 NOTE — Progress Notes (Signed)
? ?  Subjective:  ? ?Patient ID: Robin Valencia, female    DOB: May 15, 1945, 76 y.o.   MRN: [id number] ? ?HPI ?The patient is a 76 YO female coming in for night time urination.  ? ?Review of Systems  ?Constitutional: Negative.   ?HENT: Negative.    ?Eyes: Negative.   ?Respiratory:  Negative for cough, chest tightness and shortness of breath.   ?Cardiovascular:  Negative for chest pain, palpitations and leg swelling.  ?Gastrointestinal:  Negative for abdominal distention, abdominal pain, constipation, diarrhea, nausea and vomiting.  ?Genitourinary:  Positive for enuresis.  ?Musculoskeletal: Negative.   ?Skin: Negative.   ?Neurological: Negative.   ?Psychiatric/Behavioral: Negative.    ? ?Objective:  ?Physical Exam ?Constitutional:   ?   Appearance: She is well-developed.  ?HENT:  ?   Head: Normocephalic and atraumatic.  ?Cardiovascular:  ?   Rate and Rhythm: Normal rate and regular rhythm.  ?Pulmonary:  ?   Effort: Pulmonary effort is normal. No respiratory distress.  ?   Breath sounds: Normal breath sounds. No wheezing or rales.  ?Abdominal:  ?   General: Bowel sounds are normal. There is no distension.  ?   Palpations: Abdomen is soft.  ?   Tenderness: There is no abdominal tenderness. There is no rebound.  ?Musculoskeletal:  ?   Cervical back: Normal range of motion.  ?Skin: ?   General: Skin is warm and dry.  ?Neurological:  ?   Mental Status: She is alert and oriented to person, place, and time.  ?   Coordination: Coordination normal.  ? ? ?Vitals:  ? 07/25/21 1557  ?BP: 122/76  ?Pulse: 95  ?Resp: 18  ?SpO2: 98%  ?Weight: 163 lb 3.2 oz (74 kg)  ?Height: '5\' 4"'$  (1.626 m)  ? ? ?This visit occurred during the SARS-CoV-2 public health emergency.  Safety protocols were in place, including screening questions prior to the visit, additional usage of staff PPE, and extensive cleaning of exam room while observing appropriate contact time as indicated for disinfecting solutions.  ? ?Assessment & Plan:  ? ?

## 2021-07-25 NOTE — Patient Instructions (Signed)
We have sent in myrbetriq to take 1 pill daily and this could take up to a month to kick in fully.  ?

## 2021-07-26 ENCOUNTER — Telehealth: Payer: Self-pay

## 2021-07-26 NOTE — Telephone Encounter (Signed)
Pt is calling back about the Rx mirabegron ER (MYRBETRIQ) 50 MG TB24 tablet from yesterday.  ? ?Med cost was 700$ she would like a substitute. ? ?Please advise  ? ? ?

## 2021-07-27 ENCOUNTER — Encounter: Payer: Self-pay | Admitting: Internal Medicine

## 2021-07-27 DIAGNOSIS — R32 Unspecified urinary incontinence: Secondary | ICD-10-CM | POA: Insufficient documentation

## 2021-07-27 MED ORDER — OXYBUTYNIN CHLORIDE ER 5 MG PO TB24: 5.0000 mg | ORAL_TABLET | Freq: Every day | ORAL | 1 refills | Status: DC

## 2021-07-27 NOTE — Telephone Encounter (Signed)
Have sent in alternative.  ?

## 2021-07-27 NOTE — Assessment & Plan Note (Signed)
We discussed risk/benefit of treatment. She did elect to try myrbetriq 50 mg qhs which was prescribed. Due to high cost we did change to oxybutynin 5 mg qhs instead and discussed at visit risk of dizziness, constipation, dry mouth with this medication.  ?

## 2021-08-08 DIAGNOSIS — F410 Panic disorder [episodic paroxysmal anxiety] without agoraphobia: Secondary | ICD-10-CM | POA: Diagnosis not present

## 2021-08-08 DIAGNOSIS — F3181 Bipolar II disorder: Secondary | ICD-10-CM | POA: Diagnosis not present

## 2021-08-21 ENCOUNTER — Telehealth: Payer: Self-pay

## 2021-08-21 NOTE — Telephone Encounter (Signed)
Pt is calling to requesting something be called in for headache, bad cough(liquid productive). I offered appt for 4/18 and she declined stating she cant wait that long. ? ?Pt states she was told to either come in or call in for something so its does turn into Bronchitis. ? ?Pt is requesting an ABX and D/C medication fluticasone furoate-vilanterol (BREO ELLIPTA) 200-25 MCG/INH AEPB  (Last prescribed 12/06/20). ? ?Please advise ? ?I advise pt that turn out around for messages are 24-48hrs. ? ? ? ?

## 2021-08-21 NOTE — Telephone Encounter (Signed)
Pt checking status of request ? ?Pt states symptoms started on 4-14, pt states last time she had symptoms she "ended up in the hospital and can we  "push this along" ? ?Please advise ?

## 2021-08-21 NOTE — Telephone Encounter (Signed)
When did symptoms start?

## 2021-08-22 MED ORDER — DOXYCYCLINE HYCLATE 100 MG PO TABS: 100.0000 mg | ORAL_TABLET | Freq: Two times a day (BID) | ORAL | 0 refills | Status: DC

## 2021-08-22 MED ORDER — BENZONATATE 200 MG PO CAPS: 200.0000 mg | ORAL_CAPSULE | Freq: Three times a day (TID) | ORAL | 0 refills | Status: DC | PRN

## 2021-08-22 NOTE — Addendum Note (Signed)
Addended by: Pricilla Holm A on: 08/22/2021 09:04 AM ? ? Modules accepted: Orders ? ?

## 2021-08-22 NOTE — Telephone Encounter (Signed)
Spoke with the pt and she stated that her symptoms started this past Friday. She is no longer having a headache, she is experiencing a dry hacky cough. I offered her a appt but she declined stating all she needs is antibiotic.  ?

## 2021-08-22 NOTE — Telephone Encounter (Signed)
Sent in doxycycline and tessalon perles to use for cough. ?

## 2021-08-22 NOTE — Telephone Encounter (Signed)
Pt has been made aware that her medication has been sent to her pharmacy. No questions or concerns.  ?

## 2021-09-05 DIAGNOSIS — F410 Panic disorder [episodic paroxysmal anxiety] without agoraphobia: Secondary | ICD-10-CM | POA: Diagnosis not present

## 2021-09-05 DIAGNOSIS — F3181 Bipolar II disorder: Secondary | ICD-10-CM | POA: Diagnosis not present

## 2021-10-19 DIAGNOSIS — F3181 Bipolar II disorder: Secondary | ICD-10-CM | POA: Diagnosis not present

## 2021-10-20 ENCOUNTER — Ambulatory Visit: Payer: Medicare Other | Admitting: Internal Medicine

## 2021-10-25 ENCOUNTER — Telehealth: Payer: Self-pay | Admitting: Internal Medicine

## 2021-10-25 NOTE — Telephone Encounter (Signed)
Left message for patient to call back to schedule Medicare Annual Wellness Visit   Last AWV  03/31/19  Please schedule at anytime with [facility]-Nurse Health Advisor if patient calls the office back.     Any questions, please call me at [phone]  

## 2021-11-08 ENCOUNTER — Ambulatory Visit (INDEPENDENT_AMBULATORY_CARE_PROVIDER_SITE_OTHER): Payer: Medicare Other | Admitting: Internal Medicine

## 2021-11-08 ENCOUNTER — Encounter: Payer: Self-pay | Admitting: Internal Medicine

## 2021-11-08 DIAGNOSIS — J0110 Acute frontal sinusitis, unspecified: Secondary | ICD-10-CM

## 2021-11-08 DIAGNOSIS — L308 Other specified dermatitis: Secondary | ICD-10-CM

## 2021-11-08 DIAGNOSIS — L309 Dermatitis, unspecified: Secondary | ICD-10-CM | POA: Insufficient documentation

## 2021-11-08 DIAGNOSIS — R051 Acute cough: Secondary | ICD-10-CM | POA: Diagnosis not present

## 2021-11-08 DIAGNOSIS — J019 Acute sinusitis, unspecified: Secondary | ICD-10-CM | POA: Insufficient documentation

## 2021-11-08 MED ORDER — TRIAMCINOLONE ACETONIDE 0.1 % EX CREA: 1.0000 | TOPICAL_CREAM | Freq: Two times a day (BID) | CUTANEOUS | 0 refills | Status: DC

## 2021-11-08 MED ORDER — DOXYCYCLINE HYCLATE 100 MG PO TABS: 100.0000 mg | ORAL_TABLET | Freq: Two times a day (BID) | ORAL | 0 refills | Status: DC

## 2021-11-08 NOTE — Assessment & Plan Note (Signed)
Suspect flare today and rx doxycycline 1 week course. Advised to continue advair for 1-2 weeks to avoid lung exacerbation.

## 2021-11-08 NOTE — Patient Instructions (Signed)
We have sent in doxycycline to take for 1 week.   We have sent in triamcinolone ointment to use on qtip in the ears once a day and on the outside twice a day.

## 2021-11-08 NOTE — Progress Notes (Signed)
   Subjective:   Patient ID: Robin Valencia, female    DOB: 15-Apr-1946, 76 y.o.   MRN: [id number]  Sinusitis Associated symptoms include congestion, coughing, ear pain and sinus pressure. Pertinent negatives include no chills, shortness of breath, sneezing or sore throat.   The patient is a 76 YO female coming in for sick visit. Cough 3 weeks and sinus symptoms.   Review of Systems  Constitutional:  Positive for activity change. Negative for appetite change, chills, fatigue, fever and unexpected weight change.  HENT:  Positive for congestion, ear pain, nosebleeds, postnasal drip, rhinorrhea and sinus pressure. Negative for ear discharge, facial swelling, sinus pain, sneezing, sore throat, tinnitus, trouble swallowing and voice change.   Eyes: Negative.   Respiratory:  Positive for cough. Negative for chest tightness, shortness of breath and wheezing.   Cardiovascular: Negative.   Gastrointestinal: Negative.   Musculoskeletal:  Positive for myalgias.  Neurological: Negative.     Objective:  Physical Exam Constitutional:      Appearance: She is well-developed.  HENT:     Head: Normocephalic and atraumatic.     Comments: Oropharynx with redness and clear drainage, nose with swollen turbinates, TMs normal bilaterally.     Ears:     Comments: Right ear canal with redness consistent with eczema, bilateral external ear with excoriations  Neck:     Thyroid: No thyromegaly.  Cardiovascular:     Rate and Rhythm: Normal rate and regular rhythm.  Pulmonary:     Effort: Pulmonary effort is normal. No respiratory distress.     Breath sounds: Normal breath sounds. No wheezing or rales.  Abdominal:     Palpations: Abdomen is soft.  Musculoskeletal:        General: Tenderness present.     Cervical back: Normal range of motion.  Lymphadenopathy:     Cervical: No cervical adenopathy.  Skin:    General: Skin is warm and dry.  Neurological:     Mental Status: She is alert and oriented to  person, place, and time.     Vitals:   11/08/21 0927  BP: 124/88  Pulse: 82  Resp: 18  SpO2: 98%  Weight: 158 lb 3.2 oz (71.8 kg)  Height: '5\' 4"'$  (1.626 m)    Assessment & Plan:

## 2021-11-08 NOTE — Assessment & Plan Note (Signed)
Rx triamcinolone for ear eczema and can use on q tip for ear canal right more than left for up to 2 weeks.

## 2021-11-08 NOTE — Assessment & Plan Note (Signed)
Rx doxycycline 1 week for this. She is advised to continue otc allergy products and suspect air quality as cause of flare.

## 2021-11-16 ENCOUNTER — Other Ambulatory Visit: Payer: Self-pay | Admitting: Internal Medicine

## 2021-11-16 DIAGNOSIS — E890 Postprocedural hypothyroidism: Secondary | ICD-10-CM

## 2021-11-30 DIAGNOSIS — F3341 Major depressive disorder, recurrent, in partial remission: Secondary | ICD-10-CM | POA: Diagnosis not present

## 2021-11-30 DIAGNOSIS — F410 Panic disorder [episodic paroxysmal anxiety] without agoraphobia: Secondary | ICD-10-CM | POA: Diagnosis not present

## 2021-12-15 DIAGNOSIS — L400 Psoriasis vulgaris: Secondary | ICD-10-CM | POA: Diagnosis not present

## 2021-12-28 DIAGNOSIS — F410 Panic disorder [episodic paroxysmal anxiety] without agoraphobia: Secondary | ICD-10-CM | POA: Diagnosis not present

## 2021-12-28 DIAGNOSIS — F3181 Bipolar II disorder: Secondary | ICD-10-CM | POA: Diagnosis not present

## 2022-01-01 DIAGNOSIS — L718 Other rosacea: Secondary | ICD-10-CM | POA: Diagnosis not present

## 2022-01-01 DIAGNOSIS — L218 Other seborrheic dermatitis: Secondary | ICD-10-CM | POA: Diagnosis not present

## 2022-01-01 DIAGNOSIS — L812 Freckles: Secondary | ICD-10-CM | POA: Diagnosis not present

## 2022-01-11 ENCOUNTER — Other Ambulatory Visit: Payer: Self-pay | Admitting: Internal Medicine

## 2022-01-18 DIAGNOSIS — F3181 Bipolar II disorder: Secondary | ICD-10-CM | POA: Diagnosis not present

## 2022-01-26 ENCOUNTER — Ambulatory Visit (INDEPENDENT_AMBULATORY_CARE_PROVIDER_SITE_OTHER): Payer: Medicare Other

## 2022-01-26 VITALS — Ht 64.0 in | Wt 158.0 lb

## 2022-01-26 DIAGNOSIS — Z1231 Encounter for screening mammogram for malignant neoplasm of breast: Secondary | ICD-10-CM

## 2022-01-26 DIAGNOSIS — Z0000 Encounter for general adult medical examination without abnormal findings: Secondary | ICD-10-CM

## 2022-01-26 NOTE — Patient Instructions (Signed)
Robin Valencia , Thank you for taking time to come for your Medicare Wellness Visit. I appreciate your ongoing commitment to your health goals. Please review the following plan we discussed and let me know if I can assist you in the future.   These are the goals we discussed:  Goals      Patient Stated     Maintain current health status         This is a list of the screening recommended for you and due dates:  Health Maintenance  Topic Date Due   Hepatitis C Screening: USPSTF Recommendation to screen - Ages 79-79 yo.  Never done   Tetanus Vaccine  Never done   Zoster (Shingles) Vaccine (1 of 2) Never done   Colon Cancer Screening  Never done   DEXA scan (bone density measurement)  Never done   Mammogram  04/18/2018   COVID-19 Vaccine (4 - Moderna risk series) 06/09/2020   Pneumonia Vaccine (2 - PCV) 07/22/2021   Flu Shot  12/05/2021   HPV Vaccine  Aged Out    Advanced directives: Please bring a copy of your health care power of attorney and living will to the office to be added to your chart at your convenience.   Conditions/risks identified: Aim for 30 minutes of exercise or brisk walking, 6-8 glasses of water, and 5 servings of fruits and vegetables each day.   Next appointment: Follow up in one year for your annual wellness visit    Preventive Care 65 Years and Older, Female Preventive care refers to lifestyle choices and visits with your health care provider that can promote health and wellness. What does preventive care include? A yearly physical exam. This is also called an annual well check. Dental exams once or twice a year. Routine eye exams. Ask your health care provider how often you should have your eyes checked. Personal lifestyle choices, including: Daily care of your teeth and gums. Regular physical activity. Eating a healthy diet. Avoiding tobacco and drug use. Limiting alcohol use. Practicing safe sex. Taking low-dose aspirin every day. Taking vitamin  and mineral supplements as recommended by your health care provider. What happens during an annual well check? The services and screenings done by your health care provider during your annual well check will depend on your age, overall health, lifestyle risk factors, and family history of disease. Counseling  Your health care provider may ask you questions about your: Alcohol use. Tobacco use. Drug use. Emotional well-being. Home and relationship well-being. Sexual activity. Eating habits. History of falls. Memory and ability to understand (cognition). Work and work Statistician. Reproductive health. Screening  You may have the following tests or measurements: Height, weight, and BMI. Blood pressure. Lipid and cholesterol levels. These may be checked every 5 years, or more frequently if you are over 62 years old. Skin check. Lung cancer screening. You may have this screening every year starting at age 59 if you have a 30-pack-year history of smoking and currently smoke or have quit within the past 15 years. Fecal occult blood test (FOBT) of the stool. You may have this test every year starting at age 39. Flexible sigmoidoscopy or colonoscopy. You may have a sigmoidoscopy every 5 years or a colonoscopy every 10 years starting at age 41. Hepatitis C blood test. Hepatitis B blood test. Sexually transmitted disease (STD) testing. Diabetes screening. This is done by checking your blood sugar (glucose) after you have not eaten for a while (fasting). You may have this  done every 1-3 years. Bone density scan. This is done to screen for osteoporosis. You may have this done starting at age 74. Mammogram. This may be done every 1-2 years. Talk to your health care provider about how often you should have regular mammograms. Talk with your health care provider about your test results, treatment options, and if necessary, the need for more tests. Vaccines  Your health care provider may recommend  certain vaccines, such as: Influenza vaccine. This is recommended every year. Tetanus, diphtheria, and acellular pertussis (Tdap, Td) vaccine. You may need a Td booster every 10 years. Zoster vaccine. You may need this after age 80. Pneumococcal 13-valent conjugate (PCV13) vaccine. One dose is recommended after age 71. Pneumococcal polysaccharide (PPSV23) vaccine. One dose is recommended after age 77. Talk to your health care provider about which screenings and vaccines you need and how often you need them. This information is not intended to replace advice given to you by your health care provider. Make sure you discuss any questions you have with your health care provider. Document Released: 05/20/2015 Document Revised: 01/11/2016 Document Reviewed: 02/22/2015 Elsevier Interactive Patient Education  2017 [place] Prevention in the Home Falls can cause injuries. They can happen to people of all ages. There are many things you can do to make your home safe and to help prevent falls. What can I do on the outside of my home? Regularly fix the edges of walkways and driveways and fix any cracks. Remove anything that might make you trip as you walk through a door, such as a raised step or threshold. Trim any bushes or trees on the path to your home. Use bright outdoor lighting. Clear any walking paths of anything that might make someone trip, such as rocks or tools. Regularly check to see if handrails are loose or broken. Make sure that both sides of any steps have handrails. Any raised decks and porches should have guardrails on the edges. Have any leaves, snow, or ice cleared regularly. Use sand or salt on walking paths during winter. Clean up any spills in your garage right away. This includes oil or grease spills. What can I do in the bathroom? Use night lights. Install grab bars by the toilet and in the tub and shower. Do not use towel bars as grab bars. Use non-skid mats or  decals in the tub or shower. If you need to sit down in the shower, use a plastic, non-slip stool. Keep the floor dry. Clean up any water that spills on the floor as soon as it happens. Remove soap buildup in the tub or shower regularly. Attach bath mats securely with double-sided non-slip rug tape. Do not have throw rugs and other things on the floor that can make you trip. What can I do in the bedroom? Use night lights. Make sure that you have a light by your bed that is easy to reach. Do not use any sheets or blankets that are too big for your bed. They should not hang down onto the floor. Have a firm chair that has side arms. You can use this for support while you get dressed. Do not have throw rugs and other things on the floor that can make you trip. What can I do in the kitchen? Clean up any spills right away. Avoid walking on wet floors. Keep items that you use a lot in easy-to-reach places. If you need to reach something above you, use a strong step stool that  has a grab bar. Keep electrical cords out of the way. Do not use floor polish or wax that makes floors slippery. If you must use wax, use non-skid floor wax. Do not have throw rugs and other things on the floor that can make you trip. What can I do with my stairs? Do not leave any items on the stairs. Make sure that there are handrails on both sides of the stairs and use them. Fix handrails that are broken or loose. Make sure that handrails are as long as the stairways. Check any carpeting to make sure that it is firmly attached to the stairs. Fix any carpet that is loose or worn. Avoid having throw rugs at the top or bottom of the stairs. If you do have throw rugs, attach them to the floor with carpet tape. Make sure that you have a light switch at the top of the stairs and the bottom of the stairs. If you do not have them, ask someone to add them for you. What else can I do to help prevent falls? Wear shoes that: Do not  have high heels. Have rubber bottoms. Are comfortable and fit you well. Are closed at the toe. Do not wear sandals. If you use a stepladder: Make sure that it is fully opened. Do not climb a closed stepladder. Make sure that both sides of the stepladder are locked into place. Ask someone to hold it for you, if possible. Clearly mark and make sure that you can see: Any grab bars or handrails. First and last steps. Where the edge of each step is. Use tools that help you move around (mobility aids) if they are needed. These include: Canes. Walkers. Scooters. Crutches. Turn on the lights when you go into a dark area. Replace any light bulbs as soon as they burn out. Set up your furniture so you have a clear path. Avoid moving your furniture around. If any of your floors are uneven, fix them. If there are any pets around you, be aware of where they are. Review your medicines with your doctor. Some medicines can make you feel dizzy. This can increase your chance of falling. Ask your doctor what other things that you can do to help prevent falls. This information is not intended to replace advice given to you by your health care provider. Make sure you discuss any questions you have with your health care provider. Document Released: 02/17/2009 Document Revised: 09/29/2015 Document Reviewed: 05/28/2014 Elsevier Interactive Patient Education  2017 [employer].

## 2022-01-26 NOTE — Progress Notes (Addendum)
Subjective:   Robin Valencia is a 76 y.o. female who presents for Medicare Annual (Subsequent) preventive examination.   Virtual Visit via Telephone Note  I connected with  Robin Valencia on 01/26/22 at 10:15 AM EDT by telephone and verified that I am speaking with the correct person using two identifiers.  Location: Patient: home  Provider: Paulla Fore  Persons participating in the virtual visit: patient/Nurse Health Advisor   I discussed the limitations, risks, security and privacy concerns of performing an evaluation and management service by telephone and the availability of in person appointments. The patient expressed understanding and agreed to proceed.  Interactive audio and video telecommunications were attempted between this nurse and patient, however failed, due to patient having technical difficulties OR patient did not have access to video capability.  We continued and completed visit with audio only.  Some vital signs may be absent or patient reported.   Daphane Shepherd, LPN  Review of Systems     Cardiac Risk Factors include: advanced age (>32mn, >>47women)     Objective:    Today's Vitals   01/26/22 1018  Weight: 158 lb (71.7 kg)  Height: '5\' 4"'$  (1.626 m)   Body mass index is 27.12 kg/m.     01/26/2022   10:24 AM 09/26/2020    3:48 PM 09/01/2020    1:13 PM 06/23/2019    9:13 AM 06/16/2019   12:28 PM 03/31/2019    3:47 PM 08/01/2017    2:23 PM  Advanced Directives  Does Patient Have a Medical Advance Directive? Yes No No Yes Yes No   Type of [place]amed[place]of [place] will   [place] will     Does patient want to make changes to medical advance directive?    No - Patient declined     Copy of [place] Chart? No - copy requested   No - copy requested No - copy requested    Would patient like information on creating a medical advance directive?  No - Patient declined No - Patient declined    Yes (MAU/Ambulatory/Procedural Areas - Information given)      Information is confidential and restricted. Go to Review Flowsheets to unlock data.    Current Medications (verified) Outpatient Encounter Medications as of 01/26/2022  Medication Sig   buPROPion (WELLBUTRIN XL) 150 MG 24 hr tablet Take 150 mg by mouth daily.   doxycycline (VIBRA-TABS) 100 MG tablet Take 1 tablet (100 mg total) by mouth 2 (two) times daily.   fluticasone-salmeterol (ADVAIR) 100-50 MCG/ACT AEPB Inhale 1 puff into the lungs 2 (two) times daily.   levothyroxine (SYNTHROID) 50 MCG tablet TAKE 1 TABLET BY MOUTH DAILY  BEFORE BREAKFAST   Multiple Vitamin (MULTIVITAMIN ADULT PO) Take by mouth.   omeprazole (PRILOSEC) 20 MG capsule TAKE 1 CAPSULE BY MOUTH  TWICE DAILY   oxybutynin (DITROPAN-XL) 5 MG 24 hr tablet Take 1 tablet (5 mg total) by mouth at bedtime.   venlafaxine XR (EFFEXOR-XR) 75 MG 24 hr capsule Take 75 mg by mouth daily.   vitamin B-12 (CYANOCOBALAMIN) 100 MCG tablet Take 100 mcg by mouth daily.   alclomethasone (ACLOVATE) 0.05 % cream Apply topically 2 (two) times daily. (Patient not taking: Reported on 01/26/2022)   benzonatate (TESSALON) 200 MG capsule Take 1 capsule (200 mg total) by mouth 3 (three) times daily as needed. (Patient not taking: Reported on 01/26/2022)   brexpiprazole (REXULTI) TABS tablet Take by mouth daily. (Patient  not taking: Reported on 05/18/2021)   buPROPion (WELLBUTRIN XL) 300 MG 24 hr tablet Take 1 tablet (300 mg total) by mouth every morning. Take with 150 mg for a total of 450 mg daily   HYDROcodone-acetaminophen (NORCO/VICODIN) 5-325 MG tablet Take 1 tablet by mouth every 4 (four) hours as needed. (Patient not taking: Reported on 01/26/2022)   ondansetron (ZOFRAN) 4 MG tablet Take 1 tablet (4 mg total) by mouth every 4 (four) hours as needed for nausea or vomiting. (Patient not taking: Reported on 01/26/2022)   sucralfate (CARAFATE) 1 g tablet Take 1 tablet (1 g total) by mouth 4  (four) times daily -  with meals and at bedtime for 7 days.   terbinafine (LAMISIL) 250 MG tablet Take 1 pill daily for 1 week, then skip for 3 weeks, then take 1 pill daily for 1 week, then skip for 3 weeks, then take 1 pill daily for 1 week. Then stop. (Patient not taking: Reported on 01/26/2022)   triamcinolone cream (KENALOG) 0.1 % Apply 1 Application topically 2 (two) times daily. (Patient not taking: Reported on 01/26/2022)   valACYclovir (VALTREX) 1000 MG tablet Take 1 tablet (1,000 mg total) by mouth 2 (two) times daily. (Patient not taking: Reported on 01/26/2022)   No facility-administered encounter medications on file as of 01/26/2022.    Allergies (verified) Codeine, Gabapentin, and Sulfa antibiotics   History: Past Medical History:  Diagnosis Date   Arthritis    Breast cancer of upper-outer quadrant of right female breast ([place])    Depression    Eating disorder    History of stomach ulcers    Hypothyroidism    Jaundice    Pancreatitis 2009   Personal history of chemotherapy    Personal history of radiation therapy    Reflux esophagitis    Past Surgical History:  Procedure Laterality Date   BREAST LUMPECTOMY     BREAST SURGERY     CESAREAN SECTION     CESAREAN SECTION WITH BILATERAL TUBAL LIGATION     CHOLECYSTECTOMY     EXPLORATORY LAPAROTOMY     pancreatits due to gallbladder surgery   MASTECTOMY Right    THYROIDECTOMY     TONSILLECTOMY     TOTAL SHOULDER ARTHROPLASTY Left 06/23/2019   Procedure: LEFT TOTAL SHOULDER ARTHROPLASTY;  Surgeon: Marchia Bond, MD;  Location: [facility];  Service: Orthopedics;  Laterality: Left;   Family History  Problem Relation Age of Onset   Arthritis Mother    Mental illness Mother    Depression Mother    Alcohol abuse Father    Hyperlipidemia Father    Heart disease Father    Stroke Father    Hypertension Father    Hypertension Sister    Mental illness Sister    Breast cancer Sister 57   Mental illness  Brother    Depression Brother    Cancer Paternal Grandfather        prostate   Breast cancer Maternal Aunt    Social History   Socioeconomic History   Marital status: Divorced    Spouse name: Not on file   Number of children: 1   Years of education: Not on file   Highest education level: Not on file  Occupational History   Occupation: Law firm  Tobacco Use   Smoking status: Never   Smokeless tobacco: Never  Vaping Use   Vaping Use: Never used  Substance and Sexual Activity   Alcohol use: No   Drug use:  No   Sexual activity: Never  Other Topics Concern   Not on file  Social History Narrative   Not on file   Social Determinants of Health   Financial Resource Strain: Low Risk  (01/26/2022)   Overall Financial Resource Strain (CARDIA)    Difficulty of Paying Living Expenses: Not hard at all  Food Insecurity: No Food Insecurity (01/26/2022)   Hunger Vital Sign    Worried About Running Out of Food in the Last Year: Never true    Ran Out of Food in the Last Year: Never true  Transportation Needs: No Transportation Needs (01/26/2022)   PRAPARE - Hydrologist (Medical): No    Lack of Transportation (Non-Medical): No  Physical Activity: Insufficiently Active (01/26/2022)   Exercise Vital Sign    Days of Exercise per Week: 3 days    Minutes of Exercise per Session: 30 min  Stress: No Stress Concern Present (01/26/2022)   [place]    Feeling of Stress : Not at all  Social Connections: Socially Isolated (01/26/2022)   Social Connection and Isolation Panel [NHANES]    Frequency of Communication with Friends and Family: More than three times a week    Frequency of Social Gatherings with Friends and Family: More than three times a week    Attends Religious Services: Never    Marine scientist or Organizations: No    Attends Music therapist: Never    Marital Status:  Divorced    Tobacco Counseling Counseling given: Not Answered   Clinical Intake:  Pre-visit preparation completed: Yes  Pain : No/denies pain     Nutritional Risks: None Diabetes: No  How often do you need to have someone help you when you read instructions, pamphlets, or other written materials from your doctor or pharmacy?: 1 - Never  Diabetic?no   Interpreter Needed?: No  Information entered by :: Jadene Pierini, LPN   Activities of Daily Living    01/26/2022   10:24 AM  In your present state of health, do you have any difficulty performing the following activities:  Hearing? 0  Vision? 0  Difficulty concentrating or making decisions? 0  Walking or climbing stairs? 0  Dressing or bathing? 0  Doing errands, shopping? 0  Preparing Food and eating ? N  Using the Toilet? N  In the past six months, have you accidently leaked urine? N  Do you have problems with loss of bowel control? N  Managing your Medications? N  Managing your Finances? N  Housekeeping or managing your Housekeeping? N    Patient Care Team: Hoyt Koch, MD as PCP - General (Internal Medicine) Magrinat, Virgie Dad, MD (Inactive) as Consulting Physician (Oncology) Marchia Bond, MD as Consulting Physician (Orthopedic Surgery) Daron Offer Richard Miu, MD as Consulting Physician (Psychiatry)  Indicate any recent Medical Services you may have received from other than [facility] providers in the past year (date may be approximate).     Assessment:   This is a routine wellness examination for Robin Valencia.  Hearing/Vision screen Vision Screening - Comments:: Annual eye exam   Dietary issues and exercise activities discussed: Current Exercise Habits: Home exercise routine, Type of exercise: walking, Time (Minutes): 30, Frequency (Times/Week): 3, Weekly Exercise (Minutes/Week): 90, Intensity: Mild, Exercise limited by: None identified   Goals Addressed             This Visit's Progress    Patient  Stated  On track    Maintain current health status        Depression Screen    01/26/2022   10:23 AM 11/08/2021    9:33 AM 03/31/2019    3:30 PM 05/27/2018    4:24 PM 10/25/2015    2:00 PM 10/25/2015    1:59 PM  PHQ 2/9 Scores  PHQ - 2 Score 0 '3 1 5 6 '$ 0  PHQ- 9 Score  '6 2 14 24     '$ Fall Risk    01/26/2022   10:19 AM 11/08/2021    9:33 AM 07/25/2021    4:00 PM 07/22/2020    1:28 PM 03/31/2019    3:29 PM  [place] in the past year? 0 1 0 0 0  Number falls in past yr: 0 0 0 0 0  Injury with Fall? 0 0 0 0 0  Risk for fall due to : No Fall Risks      Follow up Falls prevention discussed        FALL RISK PREVENTION PERTAINING TO THE HOME:  Any stairs in or around the home? No  If so, are there any without handrails? No  Home free of loose throw rugs in walkways, pet beds, electrical cords, etc? No  Adequate lighting in your home to reduce risk of falls? No   ASSISTIVE DEVICES UTILIZED TO PREVENT FALLS:  Life alert? No  Use of a cane, walker or w/c? No  Grab bars in the bathroom? No  Shower chair or bench in shower? No  Elevated toilet seat or a handicapped toilet? No        Immunizations Immunization History  Administered Date(s) Administered   Fluad Quad(high Dose 65+) 06/02/2019   Influenza, High Dose Seasonal PF 04/26/2017, 05/27/2018   Moderna Sars-Covid-2 Vaccination 07/30/2019, 08/27/2019, 04/14/2020   Pneumococcal Polysaccharide-23 07/22/2020    TDAP status: Up to date  Flu Vaccine status: Due, Education has been provided regarding the importance of this vaccine. Advised may receive this vaccine at local pharmacy or Health Dept. Aware to provide a copy of the vaccination record if obtained from local pharmacy or Health Dept. Verbalized acceptance and understanding.  Pneumococcal vaccine status: Up to date  Covid-19 vaccine status: Completed vaccines  Qualifies for Shingles Vaccine? Yes   Zostavax completed No   Shingrix Completed?: No.     Education has been provided regarding the importance of this vaccine. Patient has been advised to call insurance company to determine out of pocket expense if they have not yet received this vaccine. Advised may also receive vaccine at local pharmacy or Health Dept. Verbalized acceptance and understanding.  Screening Tests Health Maintenance  Topic Date Due   Hepatitis C Screening  Never done   TETANUS/TDAP  Never done   Zoster Vaccines- Shingrix (1 of 2) Never done   COLONOSCOPY (Pts 45-57yr Insurance coverage will need to be confirmed)  Never done   DEXA SCAN  Never done   MAMMOGRAM  04/18/2018   COVID-19 Vaccine (4 - Moderna risk series) 06/09/2020   Pneumonia Vaccine 74 Years old (2 - PCV) 07/22/2021   INFLUENZA VACCINE  12/05/2021   HPV VACCINES  Aged Out    Health Maintenance  Health Maintenance Due  Topic Date Due   Hepatitis C Screening  Never done   TETANUS/TDAP  Never done   Zoster Vaccines- Shingrix (1 of 2) Never done   COLONOSCOPY (Pts 45-467yrInsurance coverage will need to be confirmed)  Never  done   DEXA SCAN  Never done   MAMMOGRAM  04/18/2018   COVID-19 Vaccine (4 - Moderna risk series) 06/09/2020   Pneumonia Vaccine 64+ Years old (2 - PCV) 07/22/2021   INFLUENZA VACCINE  12/05/2021    Colorectal cancer screening: Referral to GI placed patient declined . Pt aware the office will call re: appt.  Mammogram status: Ordered 01/26/2022. Pt provided with contact info and advised to call to schedule appt.   Bone Density status: Ordered declined . Pt provided with contact info and advised to call to schedule appt.  Lung Cancer Screening: (Low Dose CT Chest recommended if Age 80-80 years, 30 pack-year currently smoking OR have quit w/in 15years.) does not qualify.   Lung Cancer Screening Referral: n/a  Additional Screening:  Hepatitis C Screening: does not qualify;  Vision Screening: Recommended annual ophthalmology exams for early detection of glaucoma and  other disorders of the eye. Is the patient up to date with their annual eye exam?  Yes  Who is the provider or what is the name of the office in which the patient attends annual eye exams? [facility]  If pt is not established with a provider, would they like to be referred to a provider to establish care? No .   Dental Screening: Recommended annual dental exams for proper oral hygiene  Community Resource Referral / Chronic Care Management: CRR required this visit?  No   CCM required this visit?  No      Plan:     I have personally reviewed and noted the following in the patient's chart:   Medical and social history Use of alcohol, tobacco or illicit drugs  Current medications and supplements including opioid prescriptions. Patient is not currently taking opioid prescriptions. Functional ability and status Nutritional status Physical activity Advanced directives List of other physicians Hospitalizations, surgeries, and ER visits in previous 12 months Vitals Screenings to include cognitive, depression, and falls Referrals and appointments  In addition, I have reviewed and discussed with patient certain preventive protocols, quality metrics, and best practice recommendations. A written personalized care plan for preventive services as well as general preventive health recommendations were provided to patient.     Daphane Shepherd, LPN   9/32/3557   Nurse Notes: dur TDAP/flu   Medical screening examination/treatment/procedure(s) were performed by non-physician practitioner and as supervising physician I was immediately available for consultation/collaboration.  I agree with above. Lew Dawes, MD

## 2022-02-08 DIAGNOSIS — F3181 Bipolar II disorder: Secondary | ICD-10-CM | POA: Diagnosis not present

## 2022-02-08 DIAGNOSIS — F410 Panic disorder [episodic paroxysmal anxiety] without agoraphobia: Secondary | ICD-10-CM | POA: Diagnosis not present

## 2022-02-13 DIAGNOSIS — Z1231 Encounter for screening mammogram for malignant neoplasm of breast: Secondary | ICD-10-CM | POA: Diagnosis not present

## 2022-02-13 LAB — HM MAMMOGRAPHY

## 2022-02-19 ENCOUNTER — Encounter: Payer: Self-pay | Admitting: *Deleted

## 2022-03-01 DIAGNOSIS — F410 Panic disorder [episodic paroxysmal anxiety] without agoraphobia: Secondary | ICD-10-CM | POA: Diagnosis not present

## 2022-03-01 DIAGNOSIS — F3181 Bipolar II disorder: Secondary | ICD-10-CM | POA: Diagnosis not present

## 2022-03-14 DIAGNOSIS — F3181 Bipolar II disorder: Secondary | ICD-10-CM | POA: Diagnosis not present

## 2022-03-14 DIAGNOSIS — F410 Panic disorder [episodic paroxysmal anxiety] without agoraphobia: Secondary | ICD-10-CM | POA: Diagnosis not present

## 2022-04-05 DIAGNOSIS — F3181 Bipolar II disorder: Secondary | ICD-10-CM | POA: Diagnosis not present

## 2022-04-20 ENCOUNTER — Other Ambulatory Visit: Payer: Self-pay | Admitting: Internal Medicine

## 2022-04-20 ENCOUNTER — Telehealth: Payer: Self-pay | Admitting: Internal Medicine

## 2022-04-20 MED ORDER — AZITHROMYCIN 250 MG PO TABS: ORAL_TABLET | ORAL | 0 refills | Status: AC

## 2022-04-20 NOTE — Telephone Encounter (Signed)
Please call and find out current symptoms, when symptoms started, if home covid-19 test has been done

## 2022-04-20 NOTE — Telephone Encounter (Signed)
Have sent in z-pack. Take 2 pills day 1, then 1 pill daily days 2-5.

## 2022-04-20 NOTE — Telephone Encounter (Signed)
Patient would like someone to call her - she is having a head cold and now has a cough - patient states that Dr. Sharlet Salina always calls her in something so it will not get worse.  Pharmacy - [facility] on [address]  Phone for patient:  [phone]

## 2022-04-23 NOTE — Telephone Encounter (Signed)
Called and left voicemail for patient about instruction for medicine

## 2022-06-18 ENCOUNTER — Ambulatory Visit (INDEPENDENT_AMBULATORY_CARE_PROVIDER_SITE_OTHER): Payer: Medicare Other | Admitting: Emergency Medicine

## 2022-06-18 ENCOUNTER — Encounter: Payer: Self-pay | Admitting: Emergency Medicine

## 2022-06-18 VITALS — BP 116/66 | HR 91 | Temp 98.9°F | Ht 64.0 in | Wt 160.5 lb

## 2022-06-18 DIAGNOSIS — R051 Acute cough: Secondary | ICD-10-CM

## 2022-06-18 DIAGNOSIS — J22 Unspecified acute lower respiratory infection: Secondary | ICD-10-CM | POA: Diagnosis not present

## 2022-06-18 DIAGNOSIS — R6889 Other general symptoms and signs: Secondary | ICD-10-CM | POA: Diagnosis not present

## 2022-06-18 LAB — POC COVID19 BINAXNOW: SARS Coronavirus 2 Ag: NEGATIVE

## 2022-06-18 LAB — POCT INFLUENZA A/B
Influenza A, POC: NEGATIVE
Influenza B, POC: NEGATIVE

## 2022-06-18 LAB — POCT RESPIRATORY SYNCYTIAL VIRUS: RSV Rapid Ag: NEGATIVE

## 2022-06-18 MED ORDER — HYDROCODONE BIT-HOMATROP MBR 5-1.5 MG/5ML PO SOLN: 5.0000 mL | Freq: Every evening | ORAL | 0 refills | Status: DC | PRN

## 2022-06-18 MED ORDER — AZITHROMYCIN 250 MG PO TABS: ORAL_TABLET | ORAL | 0 refills | Status: DC

## 2022-06-18 MED ORDER — BENZONATATE 200 MG PO CAPS: 200.0000 mg | ORAL_CAPSULE | Freq: Two times a day (BID) | ORAL | 0 refills | Status: DC | PRN

## 2022-06-18 NOTE — Assessment & Plan Note (Signed)
Negative test for COVID, RSV, and influenza. Advised to rest and stay well-hydrated May take Tylenol for headaches and aches Continue over-the-counter Mucinex DM

## 2022-06-18 NOTE — Patient Instructions (Signed)

## 2022-06-18 NOTE — Assessment & Plan Note (Signed)
Viral respiratory infection with secondary bacterial infection. Will benefit from daily azithromycin for 5 days. Clinically stable.  No red flag signs or symptoms No signs of pneumonia. ED precautions given Advised to contact the office if no better or worse during the next several days

## 2022-06-18 NOTE — Progress Notes (Signed)
Robin Valencia 77 y.o.   Chief Complaint  Patient presents with   Acute Visit    Head cold, sore throat, headache, chest congestion, cough x 2 days. Patient had nose bleed today    HISTORY OF PRESENT ILLNESS: Acute problem visit today. This is a 77 y.o. female complaining of chest congestion with productive cough and headaches that started last Saturday. Had nosebleed today.  Chills but no fever.  Decreased appetite.  No nausea or vomiting.  No other associated symptoms. No other complaints or medical concerns today.  HPI   Prior to Admission medications   Medication Sig Start Date End Date Taking? Authorizing Provider  buPROPion (WELLBUTRIN XL) 150 MG 24 hr tablet Take 150 mg by mouth daily.   Yes [provider]  fluticasone-salmeterol [facility] INHUB) 100-50 MCG/ACT AEPB INHALE 1 PUFF INTO THE LUNGS TWICE DAILY 04/23/22  Yes Hoyt Koch, MD  levothyroxine (SYNTHROID) 50 MCG tablet TAKE 1 TABLET BY MOUTH DAILY  BEFORE BREAKFAST 11/16/21  Yes Hoyt Koch, MD  omeprazole (PRILOSEC) 20 MG capsule TAKE 1 CAPSULE BY MOUTH  TWICE DAILY 01/12/22  Yes Hoyt Koch, MD  oxybutynin (DITROPAN-XL) 5 MG 24 hr tablet Take 1 tablet (5 mg total) by mouth at bedtime. 07/27/21  Yes Hoyt Koch, MD  venlafaxine XR (EFFEXOR-XR) 75 MG 24 hr capsule Take 75 mg by mouth daily. 06/27/20  Yes [provider]  vitamin B-12 (CYANOCOBALAMIN) 100 MCG tablet Take 100 mcg by mouth daily.   Yes [provider]  alclomethasone (ACLOVATE) 0.05 % cream Apply topically 2 (two) times daily. Patient not taking: Reported on 01/26/2022 02/13/21   [provider]  benzonatate (TESSALON) 200 MG capsule Take 1 capsule (200 mg total) by mouth 3 (three) times daily as needed. Patient not taking: Reported on 01/26/2022 08/22/21   Hoyt Koch, MD  brexpiprazole (REXULTI) TABS tablet Take by mouth daily. Patient not taking: Reported on 05/18/2021     [provider]  buPROPion (WELLBUTRIN XL) 300 MG 24 hr tablet Take 1 tablet (300 mg total) by mouth every morning. Take with 150 mg for a total of 450 mg daily 08/01/17 08/01/18  Aundra Dubin, MD  Multiple Vitamin (MULTIVITAMIN ADULT PO) Take by mouth.    [provider]  sucralfate (CARAFATE) 1 g tablet Take 1 tablet (1 g total) by mouth 4 (four) times daily -  with meals and at bedtime for 7 days. 05/18/21 05/25/21  Jeanell Sparrow, DO  terbinafine (LAMISIL) 250 MG tablet Take 1 pill daily for 1 week, then skip for 3 weeks, then take 1 pill daily for 1 week, then skip for 3 weeks, then take 1 pill daily for 1 week. Then stop. Patient not taking: Reported on 01/26/2022 07/22/20   Hoyt Koch, MD  triamcinolone cream (KENALOG) 0.1 % Apply 1 Application topically 2 (two) times daily. Patient not taking: Reported on 01/26/2022 11/08/21   Hoyt Koch, MD  valACYclovir (VALTREX) 1000 MG tablet Take 1 tablet (1,000 mg total) by mouth 2 (two) times daily. Patient not taking: Reported on 01/26/2022 01/03/21   Hoyt Koch, MD    Allergies  Allergen Reactions   Codeine Nausea And Vomiting   Gabapentin Nausea And Vomiting   Sulfa Antibiotics Diarrhea    Patient Active Problem List   Diagnosis Date Noted   Eczema 11/08/2021   Acute sinusitis 11/08/2021   Urinary incontinence 07/27/2021   Cough 12/06/2020   Other fatigue 07/22/2020  Osteoarthritis of left shoulder 06/23/2019   Pre-operative cardiovascular examination 06/02/2019   Encounter for general adult medical examination with abnormal findings 04/26/2017   Major depressive disorder, recurrent episode, moderate ([place]) 03/19/2016   Attention and concentration deficit 10/27/2015   Memory difficulties 10/27/2015   Hypothyroidism, 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/08/2015   Reflux esophagitis 04/08/2015   Malignant neoplasm of upper-outer quadrant of right female breast ([place]) 03/12/2014   Abnormal finding on MRI of  brain 01/08/2014   Breast cancer ([place]) 07/03/2013   Depression 10/16/2012   Genital herpes 10/16/2012   Chronic pain associated with significant psychosocial dysfunction 10/16/2012   GERD (gastroesophageal reflux disease) 10/16/2012   Rosacea 10/16/2012   Hypothyroidism 10/16/2012    Past Medical History:  Diagnosis Date   Arthritis    Breast cancer of upper-outer quadrant of right female breast ([place])    Depression    Eating disorder    History of stomach ulcers    Hypothyroidism    Jaundice    Pancreatitis 2009   Personal history of chemotherapy    Personal history of radiation therapy    Reflux esophagitis     Past Surgical History:  Procedure Laterality Date   BREAST LUMPECTOMY     BREAST SURGERY     CESAREAN SECTION     CESAREAN SECTION WITH BILATERAL TUBAL LIGATION     CHOLECYSTECTOMY     EXPLORATORY LAPAROTOMY     pancreatits due to gallbladder surgery   MASTECTOMY Right    THYROIDECTOMY     TONSILLECTOMY     TOTAL SHOULDER ARTHROPLASTY Left 06/23/2019   Procedure: LEFT TOTAL SHOULDER ARTHROPLASTY;  Surgeon: Marchia Bond, MD;  Location: [facility];  Service: Orthopedics;  Laterality: Left;    Social History   Socioeconomic History   Marital status: Divorced    Spouse name: Not on file   Number of children: 1   Years of education: Not on file   Highest education level: Not on file  Occupational History   Occupation: Law firm  Tobacco Use   Smoking status: Never   Smokeless tobacco: Never  Vaping Use   Vaping Use: Never used  Substance and Sexual Activity   Alcohol use: No   Drug use: No   Sexual activity: Never  Other Topics Concern   Not on file  Social History Narrative   Not on file   Social Determinants of Health   Financial Resource Strain: Low Risk  (01/26/2022)   Overall Financial Resource Strain (CARDIA)    Difficulty of Paying Living Expenses: Not hard at all  Food Insecurity: No Food Insecurity (01/26/2022)   Hunger  Vital Sign    Worried About Running Out of Food in the Last Year: Never true    [place] in the Last Year: Never true  Transportation Needs: No Transportation Needs (01/26/2022)   PRAPARE - Hydrologist (Medical): No    Lack of Transportation (Non-Medical): No  Physical Activity: Insufficiently Active (01/26/2022)   Exercise Vital Sign    Days of Exercise per Week: 3 days    Minutes of Exercise per Session: 30 min  Stress: No Stress Concern Present (01/26/2022)   [place]    Feeling of Stress : Not at all  Social Connections: Socially Isolated (01/26/2022)   Social Connection and Isolation Panel [NHANES]    Frequency of Communication with Friends and Family: More than three times a week  Frequency of Social Gatherings with Friends and Family: More than three times a week    Attends Religious Services: Never    Marine scientist or Organizations: No    Attends Archivist Meetings: Never    Marital Status: Divorced  Human resources officer Violence: Not At Risk (01/26/2022)   Humiliation, Afraid, Rape, and Kick questionnaire    Fear of Current or Ex-Partner: No    Emotionally Abused: No    Physically Abused: No    Sexually Abused: No    Family History  Problem Relation Age of Onset   Arthritis Mother    Mental illness Mother    Depression Mother    Alcohol abuse Father    Hyperlipidemia Father    Heart disease Father    Stroke Father    Hypertension Father    Hypertension Sister    Mental illness Sister    Breast cancer Sister 79   Mental illness Brother    Depression Brother    Cancer Paternal Grandfather        prostate   Breast cancer Maternal Aunt      Review of Systems  Constitutional:  Positive for chills. Negative for fever.  HENT:  Positive for congestion and sore throat.   Respiratory:  Positive for cough and sputum production. Negative for shortness  of breath.   Cardiovascular: Negative.  Negative for chest pain and palpitations.  Gastrointestinal: Negative.  Negative for abdominal pain, diarrhea, nausea and vomiting.  Genitourinary: Negative.  Negative for dysuria and hematuria.  Skin: Negative.   Neurological: Negative.  Negative for dizziness and headaches.  All other systems reviewed and are negative.  Today's Vitals   06/18/22 1522  BP: 116/66  Pulse: 91  Temp: 98.9 F (37.2 C)  TempSrc: Oral  SpO2: 96%  Weight: 160 lb 8 oz (72.8 kg)  Height: 5' 4"$  (1.626 m)   Body mass index is 27.55 kg/m.   Physical Exam Vitals reviewed.  Constitutional:      Appearance: Normal appearance.  HENT:     Head: Normocephalic.     Right Ear: Tympanic membrane, ear canal and external ear normal.     Left Ear: Tympanic membrane, ear canal and external ear normal.     Mouth/Throat:     Mouth: Mucous membranes are moist.     Pharynx: Oropharynx is clear.  Eyes:     Extraocular Movements: Extraocular movements intact.     Conjunctiva/sclera: Conjunctivae normal.     Pupils: Pupils are equal, round, and reactive to light.  Cardiovascular:     Rate and Rhythm: Normal rate and regular rhythm.     Pulses: Normal pulses.     Heart sounds: Normal heart sounds.  Pulmonary:     Effort: Pulmonary effort is normal.     Breath sounds: Normal breath sounds.  Musculoskeletal:     Cervical back: No tenderness.  Lymphadenopathy:     Cervical: No cervical adenopathy.  Skin:    General: Skin is warm and dry.  Neurological:     General: No focal deficit present.     Mental Status: She is alert and oriented to person, place, and time.  Psychiatric:        Mood and Affect: Mood normal.        Behavior: Behavior normal.    Results for orders placed or performed in visit on 06/18/22 (from the past 24 hour(s))  POCT Influenza A/B     Status: Normal   Collection  Time: 06/18/22  3:56 PM  Result Value Ref Range   Influenza A, POC Negative  Negative   Influenza B, POC Negative Negative  POC COVID-19     Status: Normal   Collection Time: 06/18/22  3:56 PM  Result Value Ref Range   SARS Coronavirus 2 Ag Negative Negative  POCT respiratory syncytial virus     Status: Normal   Collection Time: 06/18/22  3:56 PM  Result Value Ref Range   RSV Rapid Ag negative       ASSESSMENT & PLAN: A total of 34 minutes was spent with the patient and counseling/coordination of care regarding preparing for this visit, review of most recent office visit notes, review of multiple chronic medical conditions under management, review of all medications, diagnosis of lower respiratory infection and need for antibiotics, cough management, prognosis, documentation, and need for follow-up if no better or worse during the next several days.  Problem List Items Addressed This Visit       Respiratory   Lower respiratory infection - Primary    Viral respiratory infection with secondary bacterial infection. Will benefit from daily azithromycin for 5 days. Clinically stable.  No red flag signs or symptoms No signs of pneumonia. ED precautions given Advised to contact the office if no better or worse during the next several days      Relevant Medications   azithromycin (ZITHROMAX) 250 MG tablet     Other   Cough    Continue over-the-counter Mucinex DM and cough drops Advised to rest and stay well-hydrated Recommend to start Tessalon 200 mg 3 times a day and Hycodan syrup at bedtime as needed. Cough management discussed.      Relevant Medications   benzonatate (TESSALON) 200 MG capsule   HYDROcodone bit-homatropine (HYCODAN) 5-1.5 MG/5ML syrup   Flu-like symptoms    Negative test for COVID, RSV, and influenza. Advised to rest and stay well-hydrated May take Tylenol for headaches and aches Continue over-the-counter Mucinex DM      Relevant Orders   POCT Influenza A/B (Completed)   POCT respiratory syncytial virus (Completed)   POC  COVID-19 (Completed)   Patient Instructions  Cough, Adult A cough helps to clear your throat and lungs. A cough may be a sign of an illness or another medical condition. An acute cough may only last 2-3 weeks, while a chronic cough may last 8 or more weeks. Many things can cause a cough. They include: Germs (viruses or bacteria) that attack the airway. Breathing in things that bother (irritate) your lungs. Allergies. Asthma. Mucus that runs down the back of your throat (postnasal drip). Smoking. Acid backing up from the stomach into the tube that moves food from the mouth to the stomach (gastroesophageal reflux). Some medicines. Lung problems. Other medical conditions, such as heart failure or a blood clot in the lung (pulmonary embolism). Follow these instructions at home: Medicines Take over-the-counter and prescription medicines only as told by your doctor. Talk with your doctor before you take medicines that stop a cough (cough suppressants). Lifestyle  Do not smoke, and try not to be around smoke. Do not use any products that contain nicotine or tobacco, such as cigarettes, e-cigarettes, and chewing tobacco. If you need help quitting, ask your doctor. Drink enough fluid to keep your pee (urine) pale yellow. Avoid caffeine. Do not drink alcohol if your doctor tells you not to drink. General instructions  Watch for any changes in your cough. Tell your doctor about them. Always cover  your mouth when you cough. Stay away from things that make you cough, such as perfume, candles, campfire smoke, or cleaning products. If the air is dry, use a cool mist vaporizer or humidifier in your home. If your cough is worse at night, try using extra pillows to raise your head up higher while you sleep. Rest as needed. Keep all follow-up visits as told by your doctor. This is important. Contact a doctor if: You have new symptoms. You cough up pus. Your cough does not get better after 2-3  weeks, or your cough gets worse. Cough medicine does not help your cough and you are not sleeping well. You have pain that gets worse or pain that is not helped with medicine. You have a fever. You are losing weight and you do not know why. You have night sweats. Get help right away if: You cough up blood. You have trouble breathing. Your heartbeat is very fast. These symptoms may be an emergency. Do not wait to see if the symptoms will go away. Get medical help right away. Call your local emergency services (911 in the [place] Do not drive yourself to the hospital. Summary A cough helps to clear your throat and lungs. Many things can cause a cough. Take over-the-counter and prescription medicines only as told by your doctor. Always cover your mouth when you cough. Contact a doctor if you have new symptoms or you have a cough that does not get better or gets worse. This information is not intended to replace advice given to you by your health care provider. Make sure you discuss any questions you have with your health care provider. Document Revised: 06/12/2019 Document Reviewed: 05/12/2018 Elsevier Patient Education  [place], MD [facility]

## 2022-06-18 NOTE — Assessment & Plan Note (Signed)
Continue over-the-counter Mucinex DM and cough drops Advised to rest and stay well-hydrated Recommend to start Tessalon 200 mg 3 times a day and Hycodan syrup at bedtime as needed. Cough management discussed.

## 2022-07-04 DIAGNOSIS — F3181 Bipolar II disorder: Secondary | ICD-10-CM | POA: Diagnosis not present

## 2022-08-23 ENCOUNTER — Ambulatory Visit (INDEPENDENT_AMBULATORY_CARE_PROVIDER_SITE_OTHER): Payer: Medicare Other | Admitting: Internal Medicine

## 2022-08-23 ENCOUNTER — Encounter: Payer: Self-pay | Admitting: Internal Medicine

## 2022-08-23 VITALS — BP 130/76 | HR 95 | Temp 98.3°F | Ht 64.0 in | Wt 159.0 lb

## 2022-08-23 DIAGNOSIS — J069 Acute upper respiratory infection, unspecified: Secondary | ICD-10-CM | POA: Insufficient documentation

## 2022-08-23 DIAGNOSIS — J22 Unspecified acute lower respiratory infection: Secondary | ICD-10-CM

## 2022-08-23 DIAGNOSIS — R051 Acute cough: Secondary | ICD-10-CM | POA: Diagnosis not present

## 2022-08-23 DIAGNOSIS — J45909 Unspecified asthma, uncomplicated: Secondary | ICD-10-CM | POA: Insufficient documentation

## 2022-08-23 DIAGNOSIS — J4521 Mild intermittent asthma with (acute) exacerbation: Secondary | ICD-10-CM

## 2022-08-23 LAB — POC INFLUENZA A&B (BINAX/QUICKVUE)
Influenza A, POC: NEGATIVE
Influenza B, POC: NEGATIVE

## 2022-08-23 LAB — POC SOFIA SARS ANTIGEN FIA: SARS Coronavirus 2 Ag: NEGATIVE

## 2022-08-23 MED ORDER — FLUTICASONE-SALMETEROL 100-50 MCG/ACT IN AEPB: INHALATION_SPRAY | RESPIRATORY_TRACT | 5 refills | Status: AC

## 2022-08-23 MED ORDER — HYDROCODONE BIT-HOMATROP MBR 5-1.5 MG/5ML PO SOLN: 5.0000 mL | Freq: Every evening | ORAL | 0 refills | Status: DC | PRN

## 2022-08-23 MED ORDER — AZITHROMYCIN 250 MG PO TABS: ORAL_TABLET | ORAL | 0 refills | Status: DC

## 2022-08-23 MED ORDER — METHYLPREDNISOLONE 4 MG PO TBPK: ORAL_TABLET | ORAL | 0 refills | Status: DC

## 2022-08-23 NOTE — Addendum Note (Signed)
Addended by: Coolidge Breeze on: 08/23/2022 11:43 AM   Modules accepted: Orders

## 2022-08-23 NOTE — Assessment & Plan Note (Signed)
CXR if not better Zpac Hycodan

## 2022-08-23 NOTE — Progress Notes (Signed)
Subjective:  Patient ID: Robin Valencia, female    DOB: 06-05-1945  Age: 77 y.o. MRN: [id number]  CC: chest congestion and Shortness of Breath   HPI Robin Valencia presents for ST, congestion, SOB x 1-2 d, wheezing  Outpatient Medications Prior to Visit  Medication Sig Dispense Refill   buPROPion (WELLBUTRIN XL) 150 MG 24 hr tablet Take 150 mg by mouth daily.     levothyroxine (SYNTHROID) 50 MCG tablet TAKE 1 TABLET BY MOUTH DAILY  BEFORE BREAKFAST 90 tablet 3   Multiple Vitamin (MULTIVITAMIN ADULT PO) Take by mouth.     omeprazole (PRILOSEC) 20 MG capsule TAKE 1 CAPSULE BY MOUTH  TWICE DAILY 180 capsule 3   oxybutynin (DITROPAN-XL) 5 MG 24 hr tablet Take 1 tablet (5 mg total) by mouth at bedtime. 90 tablet 1   venlafaxine XR (EFFEXOR-XR) 75 MG 24 hr capsule Take 75 mg by mouth daily.     vitamin B-12 (CYANOCOBALAMIN) 100 MCG tablet Take 100 mcg by mouth daily.     azithromycin (ZITHROMAX) 250 MG tablet Sig as indicated 6 tablet 0   benzonatate (TESSALON) 200 MG capsule Take 1 capsule (200 mg total) by mouth 2 (two) times daily as needed for cough. 20 capsule 0   fluticasone-salmeterol ([place]IX[place]A INHUB) 100-50 MCG/ACT AEPB INHALE 1 PUFF INTO THE LUNGS TWICE DAILY 60 each 1   HYDROcodone bit-homatropine (HYCODAN) 5-1.5 MG/5ML syrup Take 5 mLs by mouth at bedtime as needed for cough. 120 mL 0   alclomethasone (ACLOVATE) 0.05 % cream Apply topically 2 (two) times daily. (Patient not taking: Reported on 01/26/2022)     brexpiprazole (REXULTI) TABS tablet Take by mouth daily. (Patient not taking: Reported on 05/18/2021)     buPROPion (WELLBUTRIN XL) 300 MG 24 hr tablet Take 1 tablet (300 mg total) by mouth every morning. Take with 150 mg for a total of 450 mg daily 90 tablet 3   sucralfate (CARAFATE) 1 g tablet Take 1 tablet (1 g total) by mouth 4 (four) times daily -  with meals and at bedtime for 7 days. 28 tablet 0   terbinafine (LAMISIL) 250 MG tablet Take 1 pill daily for 1 week, then skip for 3  weeks, then take 1 pill daily for 1 week, then skip for 3 weeks, then take 1 pill daily for 1 week. Then stop. (Patient not taking: Reported on 01/26/2022) 28 tablet 0   triamcinolone cream (KENALOG) 0.1 % Apply 1 Application topically 2 (two) times daily. (Patient not taking: Reported on 01/26/2022) 100 g 0   valACYclovir (VALTREX) 1000 MG tablet Take 1 tablet (1,000 mg total) by mouth 2 (two) times daily. (Patient not taking: Reported on 01/26/2022) 20 tablet 0   No facility-administered medications prior to visit.    ROS: Review of Systems  Constitutional:  Positive for fatigue. Negative for activity change, appetite change, chills and unexpected weight change.  HENT:  Positive for congestion, sinus pain, sore throat and voice change. Negative for mouth sores and sinus pressure.   Eyes:  Negative for visual disturbance.  Respiratory:  Positive for cough, chest tightness, shortness of breath and wheezing.   Cardiovascular:  Negative for chest pain and palpitations.  Gastrointestinal:  Negative for abdominal pain and nausea.  Genitourinary:  Negative for difficulty urinating, frequency and vaginal pain.  Musculoskeletal:  Negative for back pain and gait problem.  Skin:  Negative for pallor and rash.  Neurological:  Negative for dizziness, tremors, weakness, numbness and headaches.  Psychiatric/Behavioral:  Negative  for confusion and sleep disturbance.     Objective:  BP 130/76   Pulse 95   Temp 98.3 F (36.8 C) (Oral)   Ht 5\' 4"  (1.626 m)   Wt 159 lb (72.1 kg)   SpO2 95%   BMI 27.29 kg/m   BP Readings from Last 3 Encounters:  08/23/22 130/76  06/18/22 116/66  11/08/21 124/88    Wt Readings from Last 3 Encounters:  08/23/22 159 lb (72.1 kg)  06/18/22 160 lb 8 oz (72.8 kg)  01/26/22 158 lb (71.7 kg)    Physical Exam Constitutional:      General: She is not in acute distress.    Appearance: She is well-developed. She is not ill-appearing or toxic-appearing.  HENT:      Head: Normocephalic.     Right Ear: External ear normal.     Left Ear: External ear normal.     Nose: Nose normal.     Mouth/Throat:     Pharynx: Pharyngeal swelling present. No oropharyngeal exudate.  Eyes:     General:        Right eye: No discharge.        Left eye: No discharge.     Conjunctiva/sclera: Conjunctivae normal.     Pupils: Pupils are equal, round, and reactive to light.  Neck:     Thyroid: No thyromegaly.     Vascular: No JVD.     Trachea: No tracheal deviation.  Cardiovascular:     Rate and Rhythm: Normal rate and regular rhythm.     Heart sounds: Normal heart sounds.  Pulmonary:     Effort: No respiratory distress.     Breath sounds: No stridor. No wheezing.  Abdominal:     General: Bowel sounds are normal. There is no distension.     Palpations: Abdomen is soft. There is no mass.     Tenderness: There is no abdominal tenderness. There is no guarding or rebound.  Musculoskeletal:        General: No tenderness.     Cervical back: Normal range of motion and neck supple. No rigidity.  Lymphadenopathy:     Cervical: No cervical adenopathy.  Skin:    Findings: No erythema or rash.  Neurological:     Cranial Nerves: No cranial nerve deficit.     Motor: No abnormal muscle tone.     Coordination: Coordination normal.     Deep Tendon Reflexes: Reflexes normal.  Psychiatric:        Behavior: Behavior normal.        Thought Content: Thought content normal.        Judgment: Judgment normal.   Raspy voice  Lab Results  Component Value Date   WBC 8.3 05/18/2021   HGB 16.2 (H) 05/18/2021   HCT 46.5 (H) 05/18/2021   PLT 233 05/18/2021   GLUCOSE 112 (H) 05/18/2021   CHOL 228 (H) 07/22/2020   TRIG 172.0 (H) 07/22/2020   HDL 67.00 07/22/2020   LDLCALC 127 (H) 07/22/2020   ALT 29 05/18/2021   AST 28 05/18/2021   NA 141 05/18/2021   K 4.1 05/18/2021   CL 107 05/18/2021   CREATININE 0.87 05/18/2021   BUN 22 05/18/2021   CO2 21 (L) 05/18/2021   TSH 3.16  07/22/2020   HGBA1C 5.6 07/22/2020    CT ABDOMEN PELVIS W CONTRAST  Result Date: 05/18/2021 CLINICAL DATA:  Abdominal pain. Right upper quadrant and epigastric pain. EXAM: CT ABDOMEN AND PELVIS WITH CONTRAST TECHNIQUE: Multidetector CT imaging  of the abdomen and pelvis was performed using the standard protocol following bolus administration of intravenous contrast. CONTRAST:  OMNIPAQUE IOHEXOL 300 MG/ML  SOLN COMPARISON:  None. FINDINGS: Lower chest: Lung bases are clear. No effusions. Heart is normal size. Hepatobiliary: Prior cholecystectomy. No focal hepatic abnormality. Intrahepatic and extrahepatic biliary ductal dilatation. Common bile duct measures 14 mm proximally. Pancreas: No focal abnormality or ductal dilatation. Spleen: No focal abnormality.  Normal size. Adrenals/Urinary Tract: No adrenal abnormality. No focal renal abnormality. No stones or hydronephrosis. Urinary bladder is unremarkable. Stomach/Bowel: Normal appendix. Stomach, large and small bowel grossly unremarkable. Vascular/Lymphatic: No evidence of aneurysm or adenopathy. Reproductive: No visible focal abnormality. Other: No free fluid or free air. Musculoskeletal: No acute bony abnormality. IMPRESSION: No acute findings. Prominent intrahepatic and extrahepatic biliary ducts status post cholecystectomy. This may be related to age and post cholecystectomy state. Recommend correlation with LFTs. Electronically Signed   By: Charlett Nose M.D.   On: 05/18/2021 19:41   DG Chest Portable 1 View  Result Date: 05/18/2021 CLINICAL DATA:  Midepigastric pain. EXAM: PORTABLE CHEST 1 VIEW COMPARISON:  Chest x-ray 09/26/2020. FINDINGS: The heart size and mediastinal contours are within normal limits. Both lungs are clear. Left shoulder arthroplasty is present. No acute fractures are seen. Bilateral peripherally calcified breast implants are again noted. IMPRESSION: No active disease. Electronically Signed   By: Darliss Cheney M.D.   On:  05/18/2021 18:17    Assessment & Plan:   Problem List Items Addressed This Visit       Respiratory   Asthmatic bronchitis    Renewed [place] MDI Medrol pac if worse Treat URI      Relevant Medications   fluticasone-salmeterol (WIXELA INHUB) 100-50 MCG/ACT AEPB   methylPREDNISolone (MEDROL DOSEPAK) 4 MG TBPK tablet   Lower respiratory infection   Relevant Medications   azithromycin (ZITHROMAX) 250 MG tablet   Upper respiratory infection - Primary    CXR if not better Zpac Hycodan      Relevant Medications   azithromycin (ZITHROMAX) 250 MG tablet     Other   Cough   Relevant Medications   HYDROcodone bit-homatropine (HYCODAN) 5-1.5 MG/5ML syrup      Meds ordered this encounter  Medications   azithromycin (ZITHROMAX) 250 MG tablet    Sig: Sig as indicated    Dispense:  6 tablet    Refill:  0   fluticasone-salmeterol (WIXELA INHUB) 100-50 MCG/ACT AEPB    Sig: INHALE 1 PUFF INTO THE LUNGS TWICE DAILY    Dispense:  60 each    Refill:  5   HYDROcodone bit-homatropine (HYCODAN) 5-1.5 MG/5ML syrup    Sig: Take 5 mLs by mouth at bedtime as needed for cough.    Dispense:  240 mL    Refill:  0   methylPREDNISolone (MEDROL DOSEPAK) 4 MG TBPK tablet    Sig: As directed    Dispense:  21 tablet    Refill:  0      Follow-up: Return for f/u with PCP.  Sonda Primes, MD

## 2022-08-23 NOTE — Assessment & Plan Note (Signed)
Renewed [place] MDI Medrol pac if worse Treat URI

## 2022-09-28 ENCOUNTER — Ambulatory Visit (INDEPENDENT_AMBULATORY_CARE_PROVIDER_SITE_OTHER): Payer: Medicare Other | Admitting: Internal Medicine

## 2022-09-28 ENCOUNTER — Encounter: Payer: Self-pay | Admitting: Internal Medicine

## 2022-09-28 ENCOUNTER — Ambulatory Visit (INDEPENDENT_AMBULATORY_CARE_PROVIDER_SITE_OTHER): Payer: Medicare Other

## 2022-09-28 VITALS — BP 118/86 | HR 100 | Temp 97.8°F | Ht 64.0 in | Wt 159.0 lb

## 2022-09-28 DIAGNOSIS — R051 Acute cough: Secondary | ICD-10-CM

## 2022-09-28 DIAGNOSIS — R059 Cough, unspecified: Secondary | ICD-10-CM | POA: Diagnosis not present

## 2022-09-28 MED ORDER — PREDNISONE 20 MG PO TABS: 40.0000 mg | ORAL_TABLET | Freq: Every day | ORAL | 0 refills | Status: DC

## 2022-09-28 NOTE — Assessment & Plan Note (Signed)
Suspect viral cause with exposure to grandchildren. About 10 days of symptoms. No wheezing or rhonchi on exam. Checking CXR given 3 episodes this year so far. Rx prednisone and she has some cough medicine left over. Adjust as needed.

## 2022-09-28 NOTE — Patient Instructions (Signed)
We will check the chest x-ray today.    

## 2022-09-28 NOTE — Progress Notes (Signed)
   Subjective:   Patient ID: Robin Valencia, female    DOB: 06/27/1945, 77 y.o.   MRN: [id number]  HPI The patient is a 77 YO female coming in for cough and cold symptoms 1-2 weeks. Had similar 2 times already this year.  Review of Systems  Constitutional:  Positive for activity change and appetite change. Negative for chills, fatigue, fever and unexpected weight change.  HENT:  Positive for congestion, postnasal drip, rhinorrhea and sinus pressure. Negative for ear discharge, ear pain, sinus pain, sneezing, sore throat, tinnitus, trouble swallowing and voice change.   Eyes: Negative.   Respiratory:  Positive for cough. Negative for chest tightness, shortness of breath and wheezing.   Cardiovascular: Negative.   Gastrointestinal: Negative.   Musculoskeletal:  Positive for myalgias.  Neurological: Negative.     Objective:  Physical Exam Constitutional:      Appearance: She is well-developed.  HENT:     Head: Normocephalic and atraumatic.     Comments: Oropharynx with redness and clear drainage, nose with swollen turbinates, TMs normal bilaterally.  Neck:     Thyroid: No thyromegaly.  Cardiovascular:     Rate and Rhythm: Normal rate and regular rhythm.  Pulmonary:     Effort: Pulmonary effort is normal. No respiratory distress.     Breath sounds: Normal breath sounds. No wheezing or rales.  Abdominal:     Palpations: Abdomen is soft.  Musculoskeletal:        General: Tenderness present.     Cervical back: Normal range of motion.  Lymphadenopathy:     Cervical: No cervical adenopathy.  Skin:    General: Skin is warm and dry.  Neurological:     Mental Status: She is alert and oriented to person, place, and time.     Vitals:   09/28/22 1506  BP: 118/86  Pulse: 100  Temp: 97.8 F (36.6 C)  TempSrc: Oral  SpO2: 96%  Weight: 159 lb (72.1 kg)  Height: 5\' 4"  (1.626 m)    Assessment & Plan:

## 2022-10-03 ENCOUNTER — Other Ambulatory Visit: Payer: Self-pay | Admitting: Internal Medicine

## 2022-10-03 DIAGNOSIS — J22 Unspecified acute lower respiratory infection: Secondary | ICD-10-CM

## 2022-10-03 MED ORDER — AZITHROMYCIN 250 MG PO TABS: ORAL_TABLET | ORAL | 0 refills | Status: DC

## 2022-10-19 ENCOUNTER — Other Ambulatory Visit: Payer: Self-pay | Admitting: Internal Medicine

## 2022-10-19 DIAGNOSIS — E890 Postprocedural hypothyroidism: Secondary | ICD-10-CM

## 2022-10-28 ENCOUNTER — Other Ambulatory Visit: Payer: Self-pay

## 2022-10-28 ENCOUNTER — Emergency Department (HOSPITAL_COMMUNITY): Payer: Medicare Other

## 2022-10-28 ENCOUNTER — Inpatient Hospital Stay (HOSPITAL_COMMUNITY)
Admission: EM | Admit: 2022-10-28 | Discharge: 2022-10-30 | DRG: 392 | Disposition: A | Discharge status: Home / Self Care | Payer: Medicare Other | Attending: Internal Medicine | Admitting: Internal Medicine

## 2022-10-28 ENCOUNTER — Encounter (HOSPITAL_COMMUNITY): Payer: Self-pay | Admitting: *Deleted

## 2022-10-28 DIAGNOSIS — A084 Viral intestinal infection, unspecified: Principal | ICD-10-CM | POA: Diagnosis present

## 2022-10-28 DIAGNOSIS — N179 Acute kidney failure, unspecified: Secondary | ICD-10-CM | POA: Diagnosis not present

## 2022-10-28 DIAGNOSIS — Z853 Personal history of malignant neoplasm of breast: Secondary | ICD-10-CM | POA: Diagnosis not present

## 2022-10-28 DIAGNOSIS — R197 Diarrhea, unspecified: Secondary | ICD-10-CM | POA: Diagnosis not present

## 2022-10-28 DIAGNOSIS — R000 Tachycardia, unspecified: Secondary | ICD-10-CM | POA: Diagnosis present

## 2022-10-28 DIAGNOSIS — Z8711 Personal history of peptic ulcer disease: Secondary | ICD-10-CM

## 2022-10-28 DIAGNOSIS — Z885 Allergy status to narcotic agent status: Secondary | ICD-10-CM

## 2022-10-28 DIAGNOSIS — Z9049 Acquired absence of other specified parts of digestive tract: Secondary | ICD-10-CM | POA: Diagnosis not present

## 2022-10-28 DIAGNOSIS — E860 Dehydration: Secondary | ICD-10-CM | POA: Diagnosis not present

## 2022-10-28 DIAGNOSIS — E890 Postprocedural hypothyroidism: Secondary | ICD-10-CM | POA: Diagnosis not present

## 2022-10-28 DIAGNOSIS — Z818 Family history of other mental and behavioral disorders: Secondary | ICD-10-CM | POA: Diagnosis not present

## 2022-10-28 DIAGNOSIS — A0472 Enterocolitis due to Clostridium difficile, not specified as recurrent: Secondary | ICD-10-CM | POA: Diagnosis not present

## 2022-10-28 DIAGNOSIS — E876 Hypokalemia: Secondary | ICD-10-CM | POA: Diagnosis present

## 2022-10-28 DIAGNOSIS — K219 Gastro-esophageal reflux disease without esophagitis: Secondary | ICD-10-CM | POA: Diagnosis present

## 2022-10-28 DIAGNOSIS — Z7951 Long term (current) use of inhaled steroids: Secondary | ICD-10-CM

## 2022-10-28 DIAGNOSIS — Z888 Allergy status to other drugs, medicaments and biological substances status: Secondary | ICD-10-CM

## 2022-10-28 DIAGNOSIS — J45909 Unspecified asthma, uncomplicated: Secondary | ICD-10-CM | POA: Diagnosis present

## 2022-10-28 DIAGNOSIS — Z803 Family history of malignant neoplasm of breast: Secondary | ICD-10-CM | POA: Diagnosis not present

## 2022-10-28 DIAGNOSIS — Z96612 Presence of left artificial shoulder joint: Secondary | ICD-10-CM | POA: Diagnosis present

## 2022-10-28 DIAGNOSIS — F411 Generalized anxiety disorder: Secondary | ICD-10-CM | POA: Diagnosis present

## 2022-10-28 DIAGNOSIS — Z923 Personal history of irradiation: Secondary | ICD-10-CM | POA: Diagnosis not present

## 2022-10-28 DIAGNOSIS — Z8261 Family history of arthritis: Secondary | ICD-10-CM

## 2022-10-28 DIAGNOSIS — Z9011 Acquired absence of right breast and nipple: Secondary | ICD-10-CM | POA: Diagnosis not present

## 2022-10-28 DIAGNOSIS — Z79899 Other long term (current) drug therapy: Secondary | ICD-10-CM

## 2022-10-28 DIAGNOSIS — R109 Unspecified abdominal pain: Secondary | ICD-10-CM | POA: Diagnosis not present

## 2022-10-28 DIAGNOSIS — F331 Major depressive disorder, recurrent, moderate: Secondary | ICD-10-CM | POA: Diagnosis present

## 2022-10-28 DIAGNOSIS — M1990 Unspecified osteoarthritis, unspecified site: Secondary | ICD-10-CM | POA: Diagnosis present

## 2022-10-28 DIAGNOSIS — E872 Acidosis, unspecified: Secondary | ICD-10-CM | POA: Diagnosis present

## 2022-10-28 DIAGNOSIS — Z79890 Hormone replacement therapy (postmenopausal): Secondary | ICD-10-CM

## 2022-10-28 DIAGNOSIS — Z9221 Personal history of antineoplastic chemotherapy: Secondary | ICD-10-CM | POA: Diagnosis not present

## 2022-10-28 DIAGNOSIS — K429 Umbilical hernia without obstruction or gangrene: Secondary | ICD-10-CM | POA: Diagnosis not present

## 2022-10-28 DIAGNOSIS — Z8659 Personal history of other mental and behavioral disorders: Secondary | ICD-10-CM

## 2022-10-28 DIAGNOSIS — Z882 Allergy status to sulfonamides status: Secondary | ICD-10-CM

## 2022-10-28 LAB — BASIC METABOLIC PANEL
Anion gap: 20 — ABNORMAL HIGH (ref 5–15)
BUN: 16 mg/dL (ref 8–23)
CO2: 18 mmol/L — ABNORMAL LOW (ref 22–32)
Calcium: 10.5 mg/dL — ABNORMAL HIGH (ref 8.9–10.3)
Chloride: 104 mmol/L (ref 98–111)
Creatinine, Ser: 1.34 mg/dL — ABNORMAL HIGH (ref 0.44–1.00)
GFR, Estimated: 41 mL/min — ABNORMAL LOW (ref >60)
Glucose, Bld: 148 mg/dL — ABNORMAL HIGH (ref 70–99)
Potassium: 3.6 mmol/L (ref 3.5–5.1)
Sodium: 142 mmol/L (ref 135–145)

## 2022-10-28 LAB — CBC
HCT: 50.6 % — ABNORMAL HIGH (ref 36.0–46.0)
Hemoglobin: 17.8 g/dL — ABNORMAL HIGH (ref 12.0–15.0)
MCH: 31 pg (ref 26.0–34.0)
MCHC: 35.2 g/dL (ref 30.0–36.0)
MCV: 88.2 fL (ref 80.0–100.0)
Platelets: 355 10*3/uL (ref 150–400)
RBC: 5.74 MIL/uL — ABNORMAL HIGH (ref 3.87–5.11)
RDW: 12.9 % (ref 11.5–15.5)
WBC: 12.6 10*3/uL — ABNORMAL HIGH (ref 4.0–10.5)
nRBC: 0 % (ref 0.0–0.2)

## 2022-10-28 LAB — TROPONIN I (HIGH SENSITIVITY)
Troponin I (High Sensitivity): 15 ng/L (ref <18)
Troponin I (High Sensitivity): 12 ng/L (ref <18)

## 2022-10-28 MED ORDER — LACTATED RINGERS IV SOLN: INTRAVENOUS | Status: DC

## 2022-10-28 MED ORDER — SODIUM CHLORIDE 0.9 % IV BOLUS
500.0000 mL | Freq: Once | INTRAVENOUS | Status: AC
  Administered 2022-10-28: 500 mL via INTRAVENOUS

## 2022-10-28 MED ORDER — SODIUM CHLORIDE 0.9 % IV SOLN: INTRAVENOUS | Status: DC

## 2022-10-28 MED ORDER — IOHEXOL 350 MG/ML SOLN
60.0000 mL | Freq: Once | INTRAVENOUS | Status: AC | PRN
  Administered 2022-10-28: 60 mL via INTRAVENOUS

## 2022-10-28 NOTE — ED Triage Notes (Signed)
Abdpain diarrhea feeling weak and she feels dry since thursday

## 2022-10-28 NOTE — ED Provider Notes (Signed)
[place] EMERGENCY DEPARTMENT AT [facility] Provider Note   CSN: [id number] Arrival date & time: 10/28/22  1735     History  Chief Complaint  Patient presents with   Diarrhea   Abdominal Pain    Robin Valencia is a 77 y.o. female.  Patient with a diarrheal illness since Thursday.  Patient says no blood in it.  Patient did take Pepto-Bismol so it has gotten dark in color.  Associated with crampy abdominal pain patient says had similar symptoms when she had pancreatitis in the past.  She has been having difficulty getting fluids in as well.  Vital signs on arrival temp 97.6 heart rate was 124 blood pressure 110/76 oxygen saturation is 96%.  Patient does live by herself.  Patient is not on blood thinners.  Past history significant for pancreatitis last about 2009 hypothyroidism history of stomach ulcers breast cancer upper outer quadrant right area patient also had personal history radiation treatment and chemotherapy.  Patient had a mastectomy in the past exploratory laparotomy due to pancreatitis due to gallbladder.  Patient has had her gallbladder removed.  Patient had a thyroidectomy.  Patient does not use tobacco products.  Last seen in the emergency department in January 2023 for abdominal pain at [facility] and was discharged home.  No vomiting.       Home Medications Prior to Admission medications   Medication Sig Start Date End Date Taking? Authorizing Provider  alclomethasone (ACLOVATE) 0.05 % cream Apply topically 2 (two) times daily. 02/13/21   [provider]  azithromycin (ZITHROMAX) 250 MG tablet Sig as indicated 10/03/22   Myrlene Broker, MD  brexpiprazole (REXULTI) TABS tablet Take by mouth daily.    [provider]  buPROPion (WELLBUTRIN XL) 150 MG 24 hr tablet Take 150 mg by mouth daily.    [provider]  buPROPion (WELLBUTRIN XL) 300 MG 24 hr tablet Take 1 tablet (300 mg total) by mouth every morning. Take with 150 mg for a  total of 450 mg daily 08/01/17 08/01/18  Eksir, Bo Mcclintock, MD  fluticasone-salmeterol [facility] INHUB) 100-50 MCG/ACT AEPB INHALE 1 PUFF INTO THE LUNGS TWICE DAILY 08/23/22   Plotnikov, Georgina Quint, MD  HYDROcodone bit-homatropine (HYCODAN) 5-1.5 MG/5ML syrup Take 5 mLs by mouth at bedtime as needed for cough. 08/23/22   Plotnikov, Georgina Quint, MD  levothyroxine (SYNTHROID) 50 MCG tablet TAKE 1 TABLET BY MOUTH DAILY  BEFORE BREAKFAST 10/22/22   Myrlene Broker, MD  LORazepam (ATIVAN) 0.5 MG tablet Take 0.5 mg by mouth 2 (two) times daily. 08/28/22   [provider]  methylPREDNISolone (MEDROL DOSEPAK) 4 MG TBPK tablet As directed 08/23/22   Plotnikov, Georgina Quint, MD  Multiple Vitamin (MULTIVITAMIN ADULT PO) Take by mouth.    [provider]  omeprazole (PRILOSEC) 20 MG capsule TAKE 1 CAPSULE BY MOUTH  TWICE DAILY 01/12/22   Myrlene Broker, MD  oxybutynin (DITROPAN-XL) 5 MG 24 hr tablet Take 1 tablet (5 mg total) by mouth at bedtime. 07/27/21   Myrlene Broker, MD  predniSONE (DELTASONE) 20 MG tablet Take 2 tablets (40 mg total) by mouth daily with breakfast. 09/28/22   Myrlene Broker, MD  sucralfate (CARAFATE) 1 g tablet Take 1 tablet (1 g total) by mouth 4 (four) times daily -  with meals and at bedtime for 7 days. 05/18/21 05/25/21  Sloan Leiter, DO  terbinafine (LAMISIL) 250 MG tablet Take 1 pill daily for 1 week, then skip for 3 weeks,  then take 1 pill daily for 1 week, then skip for 3 weeks, then take 1 pill daily for 1 week. Then stop. 07/22/20   Myrlene Broker, MD  triamcinolone cream (KENALOG) 0.1 % Apply 1 Application topically 2 (two) times daily. 11/08/21   Myrlene Broker, MD  valACYclovir (VALTREX) 1000 MG tablet Take 1 tablet (1,000 mg total) by mouth 2 (two) times daily. 01/03/21   Myrlene Broker, MD  venlafaxine XR (EFFEXOR-XR) 75 MG 24 hr capsule Take 75 mg by mouth daily. 06/27/20   [provider]  vitamin B-12 (CYANOCOBALAMIN)  100 MCG tablet Take 100 mcg by mouth daily.    [provider]      Allergies    Codeine, Gabapentin, and Sulfa antibiotics    Review of Systems   Review of Systems  Constitutional:  Negative for chills and fever.  HENT:  Negative for ear pain and sore throat.   Eyes:  Negative for pain and visual disturbance.  Respiratory:  Negative for cough and shortness of breath.   Cardiovascular:  Negative for chest pain and palpitations.  Gastrointestinal:  Positive for abdominal pain, diarrhea and nausea. Negative for vomiting.  Genitourinary:  Negative for dysuria and hematuria.  Musculoskeletal:  Negative for arthralgias and back pain.  Skin:  Negative for color change and rash.  Neurological:  Negative for seizures and syncope.  All other systems reviewed and are negative.   Physical Exam Updated Vital Signs BP 118/66   Pulse 89   Temp 97.6 F (36.4 C)   Resp 18   Ht 1.626 m (5\' 4" )   Wt 72.1 kg   SpO2 98%   BMI 27.28 kg/m  Physical Exam Vitals and nursing note reviewed.  Constitutional:      General: She is not in acute distress.    Appearance: She is well-developed.  HENT:     Head: Normocephalic and atraumatic.     Mouth/Throat:     Mouth: Mucous membranes are dry.     Comments: Mucous membranes dry Eyes:     Extraocular Movements: Extraocular movements intact.     Conjunctiva/sclera: Conjunctivae normal.     Pupils: Pupils are equal, round, and reactive to light.  Cardiovascular:     Rate and Rhythm: Normal rate and regular rhythm.     Heart sounds: No murmur heard. Pulmonary:     Effort: Pulmonary effort is normal. No respiratory distress.     Breath sounds: Normal breath sounds.  Abdominal:     General: There is no distension.     Palpations: Abdomen is soft.     Tenderness: There is no abdominal tenderness. There is no guarding.  Musculoskeletal:        General: No swelling.     Cervical back: Normal range of motion and neck supple.  Skin:     General: Skin is warm and dry.     Capillary Refill: Capillary refill takes less than 2 seconds.  Neurological:     General: No focal deficit present.     Mental Status: She is alert and oriented to person, place, and time.  Psychiatric:        Mood and Affect: Mood normal.     ED Results / Procedures / Treatments   Labs (all labs ordered are listed, but only abnormal results are displayed) Labs Reviewed  BASIC METABOLIC PANEL - Abnormal; Notable for the following components:      Result Value   CO2 18 (*)  Glucose, Bld 148 (*)    Creatinine, Ser 1.34 (*)    Calcium 10.5 (*)    GFR, Estimated 41 (*)    Anion gap 20 (*)    All other components within normal limits  CBC - Abnormal; Notable for the following components:   WBC 12.6 (*)    RBC 5.74 (*)    Hemoglobin 17.8 (*)    HCT 50.6 (*)    All other components within normal limits  C DIFFICILE QUICK SCREEN W PCR REFLEX    GASTROINTESTINAL PANEL BY PCR, STOOL (REPLACES STOOL CULTURE)  TROPONIN I (HIGH SENSITIVITY)  TROPONIN I (HIGH SENSITIVITY)    EKG EKG Interpretation  Date/Time:  Sunday October 28 2022 18:04:34 EDT Ventricular Rate:  118 PR Interval:  144 QRS Duration: 80 QT Interval:  426 QTC Calculation: 597 R Axis:   28 Text Interpretation: Long QTc Sinus tachycardia Nonspecific ST and T wave abnormality Prolonged QT Abnormal ECG When compared with ECG of 28-Oct-2022 18:03, PREVIOUS ECG IS PRESENT Confirmed by Vanetta Mulders [phone]) on 10/28/2022 7:57:12 PM  Radiology CT ABDOMEN PELVIS W CONTRAST  Result Date: 10/28/2022 CLINICAL DATA:  Acute nonlocalized abdominal pain. Diarrhea. History of pancreatitis EXAM: CT ABDOMEN AND PELVIS WITH CONTRAST TECHNIQUE: Multidetector CT imaging of the abdomen and pelvis was performed using the standard protocol following bolus administration of intravenous contrast. RADIATION DOSE REDUCTION: This exam was performed according to the departmental dose-optimization program which  includes automated exposure control, adjustment of the mA and/or kV according to patient size and/or use of iterative reconstruction technique. CONTRAST:  60mL OMNIPAQUE IOHEXOL 350 MG/ML SOLN COMPARISON:  CT abdomen and pelvis 05/18/2021 FINDINGS: Lower chest: No acute abnormality. Hepatobiliary: Mild hepatic steatosis. Cholecystectomy. No biliary dilation. Pancreas: Unremarkable. Spleen: Unremarkable. Adrenals/Urinary Tract: Stable adrenal glands. No urinary calculi or hydronephrosis. Unremarkable bladder. Stomach/Bowel: Normal caliber large and small bowel. Enteric contrast within the colon. Normal appendix. Stomach is within normal limits. Vascular/Lymphatic: Mild aortic atherosclerotic calcification. No lymphadenopathy. Reproductive: Uterus and bilateral adnexa are unremarkable. Other: No free intraperitoneal fluid or air. Fat containing umbilical hernia. Musculoskeletal: No acute fracture. Advanced thoracolumbar spondylosis. Remote compression deformity of the superior endplate of L4. IMPRESSION: 1. No acute findings in the abdomen or pelvis. 2. Mild hepatic steatosis. Aortic Atherosclerosis (ICD10-I70.0). Electronically Signed   By: Minerva Fester M.D.   On: 10/28/2022 21:48   DG Chest 2 View  Result Date: 10/28/2022 CLINICAL DATA:  Abdominal pain EXAM: CHEST - 2 VIEW COMPARISON:  Radiographs dated Sep 28, 2022 FINDINGS: The heart size and mediastinal contours are within normal limits. Both lungs are clear. Left shoulder arthroplasty. Bilateral breast implants incidentally noted. IMPRESSION: No active cardiopulmonary disease. Electronically Signed   By: Larose Hires D.O.   On: 10/28/2022 19:20    Procedures Procedures    Medications Ordered in ED Medications  0.9 %  sodium chloride infusion ( Intravenous New Bag/Given 10/28/22 2106)  sodium chloride 0.9 % bolus 500 mL (0 mLs Intravenous Stopped 10/28/22 2105)  iohexol (OMNIPAQUE) 350 MG/ML injection 60 mL (60 mLs Intravenous Contrast Given  10/28/22 2139)    ED Course/ Medical Decision Making/ A&P                             Medical Decision Making Amount and/or Complexity of Data Reviewed Labs: ordered. Radiology: ordered.  Risk Prescription drug management. Decision regarding hospitalization.   CRITICAL CARE Performed by: Vanetta Mulders Total critical care time:  40 minutes Critical care time was exclusive of separately billable procedures and treating other patients. Critical care was necessary to treat or prevent imminent or life-threatening deterioration. Critical care was time spent personally by me on the following activities: development of treatment plan with patient and/or surrogate as well as nursing, discussions with consultants, evaluation of patient's response to treatment, examination of patient, obtaining history from patient or surrogate, ordering and performing treatments and interventions, ordering and review of laboratory studies, ordering and review of radiographic studies, pulse oximetry and re-evaluation of patient's condition.  Will send stool for C. difficile and stool panel.  Patient receiving IV fluids.  White count was 12.6 hemoglobin 17.8 so probably fairly hemoconcentrated platelets 355 basic metabolic panel CO2 18 electrolytes normal creatinine 1.35 glucose 38.  This is for a GFR 41 which is much lower than baseline for her.  And her creatinine is more elevated than usual.  Based on this or some component of subacute kidney injury.  Troponin was 12.  CT scan showed no acute findings in the abdomen or pelvis.  The patient will benefit from continued IV fluids.  Since the diarrhea is persisting she will just get further dehydrated.  Contact hospitalist for admission.  No acute abdominal process.  Final Clinical Impression(s) / ED Diagnoses Final diagnoses:  Diarrhea, unspecified type  Dehydration  AKI (acute kidney injury) [facility])    Rx / DC Orders ED Discharge Orders     None          Vanetta Mulders, MD 10/28/22 2321

## 2022-10-29 ENCOUNTER — Encounter (HOSPITAL_COMMUNITY): Payer: Self-pay | Admitting: Internal Medicine

## 2022-10-29 DIAGNOSIS — Z96612 Presence of left artificial shoulder joint: Secondary | ICD-10-CM | POA: Diagnosis present

## 2022-10-29 DIAGNOSIS — J45909 Unspecified asthma, uncomplicated: Secondary | ICD-10-CM

## 2022-10-29 DIAGNOSIS — Z79899 Other long term (current) drug therapy: Secondary | ICD-10-CM | POA: Diagnosis not present

## 2022-10-29 DIAGNOSIS — M1990 Unspecified osteoarthritis, unspecified site: Secondary | ICD-10-CM | POA: Diagnosis present

## 2022-10-29 DIAGNOSIS — Z803 Family history of malignant neoplasm of breast: Secondary | ICD-10-CM | POA: Diagnosis not present

## 2022-10-29 DIAGNOSIS — R197 Diarrhea, unspecified: Secondary | ICD-10-CM | POA: Diagnosis present

## 2022-10-29 DIAGNOSIS — Z79890 Hormone replacement therapy (postmenopausal): Secondary | ICD-10-CM | POA: Diagnosis not present

## 2022-10-29 DIAGNOSIS — K219 Gastro-esophageal reflux disease without esophagitis: Secondary | ICD-10-CM | POA: Diagnosis present

## 2022-10-29 DIAGNOSIS — E860 Dehydration: Secondary | ICD-10-CM | POA: Diagnosis present

## 2022-10-29 DIAGNOSIS — Z9011 Acquired absence of right breast and nipple: Secondary | ICD-10-CM | POA: Diagnosis not present

## 2022-10-29 DIAGNOSIS — A084 Viral intestinal infection, unspecified: Principal | ICD-10-CM | POA: Insufficient documentation

## 2022-10-29 DIAGNOSIS — E876 Hypokalemia: Secondary | ICD-10-CM | POA: Diagnosis present

## 2022-10-29 DIAGNOSIS — E890 Postprocedural hypothyroidism: Secondary | ICD-10-CM

## 2022-10-29 DIAGNOSIS — A0472 Enterocolitis due to Clostridium difficile, not specified as recurrent: Secondary | ICD-10-CM | POA: Diagnosis not present

## 2022-10-29 DIAGNOSIS — F331 Major depressive disorder, recurrent, moderate: Secondary | ICD-10-CM | POA: Diagnosis present

## 2022-10-29 DIAGNOSIS — N179 Acute kidney failure, unspecified: Secondary | ICD-10-CM | POA: Diagnosis present

## 2022-10-29 DIAGNOSIS — Z853 Personal history of malignant neoplasm of breast: Secondary | ICD-10-CM | POA: Diagnosis not present

## 2022-10-29 DIAGNOSIS — Z9049 Acquired absence of other specified parts of digestive tract: Secondary | ICD-10-CM | POA: Diagnosis not present

## 2022-10-29 DIAGNOSIS — Z9221 Personal history of antineoplastic chemotherapy: Secondary | ICD-10-CM | POA: Diagnosis not present

## 2022-10-29 DIAGNOSIS — Z923 Personal history of irradiation: Secondary | ICD-10-CM | POA: Diagnosis not present

## 2022-10-29 DIAGNOSIS — Z818 Family history of other mental and behavioral disorders: Secondary | ICD-10-CM | POA: Diagnosis not present

## 2022-10-29 DIAGNOSIS — F411 Generalized anxiety disorder: Secondary | ICD-10-CM | POA: Diagnosis present

## 2022-10-29 DIAGNOSIS — Z8711 Personal history of peptic ulcer disease: Secondary | ICD-10-CM | POA: Diagnosis not present

## 2022-10-29 DIAGNOSIS — Z8261 Family history of arthritis: Secondary | ICD-10-CM | POA: Diagnosis not present

## 2022-10-29 DIAGNOSIS — E872 Acidosis, unspecified: Secondary | ICD-10-CM | POA: Diagnosis present

## 2022-10-29 DIAGNOSIS — Z8659 Personal history of other mental and behavioral disorders: Secondary | ICD-10-CM | POA: Diagnosis not present

## 2022-10-29 LAB — CBC
HCT: 44.3 % (ref 36.0–46.0)
Hemoglobin: 15.5 g/dL — ABNORMAL HIGH (ref 12.0–15.0)
MCH: 31.6 pg (ref 26.0–34.0)
MCHC: 35 g/dL (ref 30.0–36.0)
MCV: 90.2 fL (ref 80.0–100.0)
Platelets: 272 10*3/uL (ref 150–400)
RBC: 4.91 MIL/uL (ref 3.87–5.11)
RDW: 13.1 % (ref 11.5–15.5)
WBC: 8.1 10*3/uL (ref 4.0–10.5)
nRBC: 0 % (ref 0.0–0.2)

## 2022-10-29 LAB — GASTROINTESTINAL PANEL BY PCR, STOOL (REPLACES STOOL CULTURE)

## 2022-10-29 LAB — TSH: TSH: 5.62 u[IU]/mL — ABNORMAL HIGH (ref 0.350–4.500)

## 2022-10-29 LAB — COMPREHENSIVE METABOLIC PANEL
ALT: 24 U/L (ref 0–44)
AST: 19 U/L (ref 15–41)
Albumin: 3.4 g/dL — ABNORMAL LOW (ref 3.5–5.0)
Alkaline Phosphatase: 110 U/L (ref 38–126)
Anion gap: 11 (ref 5–15)
BUN: 15 mg/dL (ref 8–23)
CO2: 22 mmol/L (ref 22–32)
Calcium: 9.1 mg/dL (ref 8.9–10.3)
Chloride: 105 mmol/L (ref 98–111)
Creatinine, Ser: 0.81 mg/dL (ref 0.44–1.00)
GFR, Estimated: >60 mL/min (ref >60)
Glucose, Bld: 102 mg/dL — ABNORMAL HIGH (ref 70–99)
Potassium: 3.2 mmol/L — ABNORMAL LOW (ref 3.5–5.1)
Sodium: 138 mmol/L (ref 135–145)
Total Bilirubin: 1.1 mg/dL (ref 0.3–1.2)
Total Protein: 6.1 g/dL — ABNORMAL LOW (ref 6.5–8.1)

## 2022-10-29 LAB — HEPATIC FUNCTION PANEL
ALT: 22 U/L (ref 0–44)
AST: 27 U/L (ref 15–41)
Albumin: 3.3 g/dL — ABNORMAL LOW (ref 3.5–5.0)
Alkaline Phosphatase: 109 U/L (ref 38–126)
Bilirubin, Direct: 0.3 mg/dL — ABNORMAL HIGH (ref 0.0–0.2)
Indirect Bilirubin: 0.8 mg/dL (ref 0.3–0.9)
Total Bilirubin: 1.1 mg/dL (ref 0.3–1.2)
Total Protein: 6 g/dL — ABNORMAL LOW (ref 6.5–8.1)

## 2022-10-29 LAB — C DIFFICILE QUICK SCREEN W PCR REFLEX
C Diff antigen: NEGATIVE
C Diff interpretation: NOT DETECTED
C Diff toxin: NEGATIVE

## 2022-10-29 LAB — LIPASE, BLOOD: Lipase: 33 U/L (ref 11–51)

## 2022-10-29 MED ORDER — ENOXAPARIN SODIUM 40 MG/0.4ML IJ SOSY
40.0000 mg | PREFILLED_SYRINGE | INTRAMUSCULAR | Status: DC
  Administered 2022-10-29: 40 mg via SUBCUTANEOUS
  Filled 2022-10-29: qty 0.4

## 2022-10-29 MED ORDER — FLUTICASONE FUROATE-VILANTEROL 100-25 MCG/ACT IN AEPB
1.0000 | INHALATION_SPRAY | Freq: Every day | RESPIRATORY_TRACT | Status: DC
  Filled 2022-10-29: qty 28

## 2022-10-29 MED ORDER — SODIUM CHLORIDE 0.9 % IV SOLN: 250.0000 mL | INTRAVENOUS | Status: DC | PRN

## 2022-10-29 MED ORDER — FIDAXOMICIN 200 MG PO TABS
200.0000 mg | ORAL_TABLET | Freq: Two times a day (BID) | ORAL | Status: DC
  Administered 2022-10-29: 200 mg via ORAL
  Filled 2022-10-29: qty 1

## 2022-10-29 MED ORDER — SODIUM CHLORIDE 0.9% FLUSH
3.0000 mL | Freq: Two times a day (BID) | INTRAVENOUS | Status: DC
  Administered 2022-10-29 (×2): 3 mL via INTRAVENOUS

## 2022-10-29 MED ORDER — HEPARIN SODIUM (PORCINE) 5000 UNIT/ML IJ SOLN
5000.0000 [IU] | Freq: Three times a day (TID) | INTRAMUSCULAR | Status: DC
  Administered 2022-10-29: 5000 [IU] via SUBCUTANEOUS
  Filled 2022-10-29: qty 1

## 2022-10-29 MED ORDER — PANTOPRAZOLE SODIUM 40 MG PO TBEC
40.0000 mg | DELAYED_RELEASE_TABLET | Freq: Every day | ORAL | Status: DC
  Administered 2022-10-29 – 2022-10-30 (×2): 40 mg via ORAL
  Filled 2022-10-29 (×2): qty 1

## 2022-10-29 MED ORDER — BUPROPION HCL ER (XL) 150 MG PO TB24
150.0000 mg | ORAL_TABLET | Freq: Every day | ORAL | Status: DC
  Administered 2022-10-29 – 2022-10-30 (×2): 150 mg via ORAL
  Filled 2022-10-29 (×2): qty 1

## 2022-10-29 MED ORDER — POTASSIUM CHLORIDE CRYS ER 20 MEQ PO TBCR
40.0000 meq | EXTENDED_RELEASE_TABLET | Freq: Once | ORAL | Status: AC
  Administered 2022-10-29: 40 meq via ORAL
  Filled 2022-10-29: qty 2

## 2022-10-29 MED ORDER — LEVOTHYROXINE SODIUM 50 MCG PO TABS
50.0000 ug | ORAL_TABLET | Freq: Every day | ORAL | Status: DC
  Administered 2022-10-29 – 2022-10-30 (×2): 50 ug via ORAL
  Filled 2022-10-29 (×2): qty 1

## 2022-10-29 MED ORDER — VENLAFAXINE HCL ER 75 MG PO CP24
75.0000 mg | ORAL_CAPSULE | Freq: Every day | ORAL | Status: DC
  Administered 2022-10-29 – 2022-10-30 (×2): 75 mg via ORAL
  Filled 2022-10-29 (×2): qty 1

## 2022-10-29 MED ORDER — SODIUM CHLORIDE 0.9% FLUSH: 3.0000 mL | INTRAVENOUS | Status: DC | PRN

## 2022-10-29 NOTE — Assessment & Plan Note (Addendum)
Hypokalemia   At the time of her discharge her renal function is stable with serum cr at 0,80 with K at 4,0 and Mg 1,9. Bicarbonate 21 and Na 139 Anion gap is 9.   Patient will continue with regular diet, and follow up renal function and electrolytes as outpatient.

## 2022-10-29 NOTE — Progress Notes (Signed)
Ok to replace k+ with x1 per Dr. Ella Jubilee.  Ulyses Southward, PharmD, BCIDP, AAHIVP, CPP Infectious Disease Pharmacist 10/29/2022 10:24 AM

## 2022-10-29 NOTE — Assessment & Plan Note (Signed)
Continue bupropion and venlafaxine

## 2022-10-29 NOTE — Progress Notes (Signed)
  Progress Note   Patient: Robin Valencia ZOX:[id number] DOB: 05-Sep-1945 DOA: 10/28/2022     0 DOS: the patient was seen and examined on 10/29/2022   Brief hospital course: [name] was admitted to the hospital with the working diagnosis of acute gastroenteritis.   77 yo female with the past medical history of hypothyroidism, anxiety, breast cancer, and reactive airway diease who presented with abdominal pain and diarrhea. Worsening diarrhea at home for the last 3 days, associated with generalized abdominal pain, not tolerating po intake. Recent antibiotic exposure with azithromycin for sinus infection 09/28/2022. On her initial physical examination her HR 124, RR 24, blood pressure 110/76, and 02 saturation 95%, lungs with no wheezing or rales, heart with S1 and S2 present and tachycardic, abdomen with no distention and no lower extremity edema.   Na 142, K 3,6 Cl 104, bicarbonate 18, glucose 148, bun 16 cr 1,34.  Wbc 12.6 hgb 17,8 plt 355   Assessment and Plan: Acute diarrhea Clinically improving.  Stool panel pending.   Plan to continue supportive medical therapy with IV fluids, correct electrolytes. As needed antiemetics and analgesics Antiacids Regular diet as tolerated. Out of bed to chair tid with meals, Pt and Ot  AKI (acute kidney injury) (HCC) Hypokalemia   Renal function with serum cr at 0,81 with K at 3,2 and serum bicarbonate at 22.  Na 138.  Plan to correct K with Kcl 40 meq x 2 doses Follow up Mg and electrolytes in am.  Continue IV fluids with LR at 50 ml per hr.   Hypothyroidism, postsurgical Continue with levothyroxine.   Major depressive disorder, recurrent episode, moderate (HCC) Continue bupropion and venlafaxine.   Reactive airway disease Continue with fluticasone and vilanterol combination.  No acute exacerbation.         Subjective: Patient is feeling better, diarrhea is improving but not resolved, no nausea or vomiting.   Physical  Exam: Vitals:   10/29/22 0825 10/29/22 1010 10/29/22 1337 10/29/22 1404  BP: (!) 81/46 137/65 107/62 (!) 186/71  Pulse: 100 82 84 91  Resp:  16    Temp: 98.2 F (36.8 C)  98.2 F (36.8 C) 98.4 F (36.9 C)  TempSrc: Oral  Oral Oral  SpO2: 98%  100% 100%  Weight:      Height:       Neurology awake and alert ENT with mild pallor Cardiovascular with S1 and S2 present and rhythmic with no gallops, rubs or murmurs No JVD No lower extremity edema Respiratory with no rales or wheezing Abdomen with no distention, no tender   Data Reviewed:    Family Communication: no family at the bedside  Disposition: Status is: Inpatient Remains inpatient appropriate because: IV fluids   Planned Discharge Destination: Home     Author: Coralie Keens, MD 10/29/2022 2:29 PM  For on call review [email or web address].

## 2022-10-29 NOTE — Assessment & Plan Note (Signed)
Continue with fluticasone and vilanterol combination.  No acute exacerbation.

## 2022-10-29 NOTE — Assessment & Plan Note (Signed)
Continue with levothyroxine  

## 2022-10-29 NOTE — Assessment & Plan Note (Addendum)
C diff and stool panel negative.  Diarrhea has been progressively improving, no nausea or vomiting.   Plan to continue supportive care at home. Continue proton pump inhibitors.  Follow up as outpatient in 7 to 10 days.

## 2022-10-29 NOTE — H&P (Addendum)
History and Physical    Robin Valencia NWG:[id number] DOB: Dec 11, 1945 DOA: 10/28/2022  DOS: the patient was seen and examined on 10/29/2022  PCP: Myrlene Broker, MD   Patient coming from: Home   Chief Complaint:  Chief Complaint  Patient presents with   Diarrhea   Abdominal Pain    HPI:  77 year old female medical history significant for generalized anxiety disorder, reactive airway disease, hypothyroidism and breast cancer presented to emergency department with complaining of ongoing diarrhea for last 3 days. Patient reported from Thursday night she started having multiple episodes of loose stool.  Initially the stool was formed then started becoming watery.  She continues to have 4-5 episodes of loose stool in the day.  Took Pepto-Bismol without any improvement.  Diarrhea associated with generalized abdominal pain.  Denies any fever, chills and nausea.  Patient denies any noticing blood in the stool.   She is also endorsing episodes of sweating.  She was trying to eat some food however continues to have runny stool and cannot hold the food.  Denies any sick contact.  Denies any concern for food poisoning.  Patient was by herself. Patient reported she has recurrent sinus infection and she was treated with azithromycin Dosepak on 09/28/2022.  ED Course:  At ED on initial presentation she was found to tachycardic 124, respiratory 24 blood pressure 110/76. High-sensitivity troponin negative.  EKG shows sinus tachycardia. Lab work showed sodium 142, potassium 3.6, low bicarb 18, elevated creatinine 1.34 slightly elevated calcium 10.5 and elevated anion gap 20. Chest x-ray unremarkable. CT abdomen pelvis with contrast no acute intra-abdominal finding and mild hepatic steatosis. Given patient has exposed to azithromycin recently C. difficile panel has been ordered.  Pending GI panel.  In the ED patient received 500 bolus of normal saline with that tachycardia has been  resolved. Hospitalist team has been contacted for admission.  Review of Systems:  Review of Systems  Constitutional:  Positive for diaphoresis. Negative for chills and fever.  Respiratory:  Negative for cough and sputum production.   Cardiovascular:  Negative for chest pain, palpitations and orthopnea.  Gastrointestinal:  Positive for abdominal pain and diarrhea. Negative for blood in stool, heartburn, melena, nausea and vomiting.  Genitourinary: Negative.   Musculoskeletal: Negative.   Neurological:  Negative for dizziness and headaches.    Past Medical History:  Diagnosis Date   Arthritis    Breast cancer of upper-outer quadrant of right female breast (HCC)    Depression    Eating disorder    History of stomach ulcers    Hypothyroidism    Jaundice    Pancreatitis 2009   Personal history of chemotherapy    Personal history of radiation therapy    Reflux esophagitis     Past Surgical History:  Procedure Laterality Date   BREAST LUMPECTOMY     BREAST SURGERY     CESAREAN SECTION     CESAREAN SECTION WITH BILATERAL TUBAL LIGATION     CHOLECYSTECTOMY     EXPLORATORY LAPAROTOMY     pancreatits due to gallbladder surgery   MASTECTOMY Right    THYROIDECTOMY     TONSILLECTOMY     TOTAL SHOULDER ARTHROPLASTY Left 06/23/2019   Procedure: LEFT TOTAL SHOULDER ARTHROPLASTY;  Surgeon: Teryl Lucy, MD;  Location: [facility];  Service: Orthopedics;  Laterality: Left;     reports that she has never smoked. She has never used smokeless tobacco. She reports that she does not drink alcohol and does not use drugs.  Allergies  Allergen Reactions   Codeine Nausea And Vomiting   Gabapentin Nausea And Vomiting   Sulfa Antibiotics Diarrhea    Family History  Problem Relation Age of Onset   Arthritis Mother    Mental illness Mother    Depression Mother    Alcohol abuse Father    Hyperlipidemia Father    Heart disease Father    Stroke Father    Hypertension Father     Hypertension Sister    Mental illness Sister    Breast cancer Sister 91   Mental illness Brother    Depression Brother    Cancer Paternal Grandfather        prostate   Breast cancer Maternal Aunt     Prior to Admission medications   Medication Sig Start Date End Date Taking? Authorizing Provider  bismuth subsalicylate (PEPTO BISMOL) 262 MG chewable tablet Chew 524 mg by mouth as needed for diarrhea or loose stools.   Yes [provider]  buPROPion (WELLBUTRIN XL) 150 MG 24 hr tablet Take 150 mg by mouth daily.   Yes [provider]  fluticasone-salmeterol (WIXELA INHUB) 100-50 MCG/ACT AEPB INHALE 1 PUFF INTO THE LUNGS TWICE DAILY 08/23/22  Yes Plotnikov, Georgina Quint, MD  levothyroxine (SYNTHROID) 50 MCG tablet TAKE 1 TABLET BY MOUTH DAILY  BEFORE BREAKFAST 10/22/22  Yes Myrlene Broker, MD  LORazepam (ATIVAN) 0.5 MG tablet Take 0.5 mg by mouth 2 (two) times daily as needed for anxiety. 08/28/22  Yes [provider]  venlafaxine XR (EFFEXOR-XR) 75 MG 24 hr capsule Take 75 mg by mouth daily. 06/27/20  Yes [provider]  omeprazole (PRILOSEC) 20 MG capsule TAKE 1 CAPSULE BY MOUTH  TWICE DAILY Patient taking differently: Take 20 mg by mouth 2 (two) times daily before a meal. 01/12/22   Myrlene Broker, MD  vitamin B-12 (CYANOCOBALAMIN) 100 MCG tablet Take 100 mcg by mouth daily.    [provider]     Physical Exam: Vitals:   10/29/22 0020 10/29/22 0020 10/29/22 0200 10/29/22 0340  BP: (!) 149/63  132/70 126/69  Pulse: 91  80 79  Resp: (!) 23  14 16   Temp:  97.9 F (36.6 C)  98.2 F (36.8 C)  TempSrc:  Oral  Oral  SpO2: 100%  97% 95%  Weight:      Height:        Physical Exam Constitutional:      General: She is not in acute distress.    Appearance: She is not ill-appearing.  HENT:     Mouth/Throat:     Mouth: Mucous membranes are moist.  Cardiovascular:     Rate and Rhythm: Tachycardia present.     Heart sounds:  Normal heart sounds.  Pulmonary:     Effort: Pulmonary effort is normal.     Breath sounds: Normal breath sounds.  Abdominal:     General: Abdomen is protuberant. Bowel sounds are normal.     Palpations: Abdomen is soft.     Tenderness: There is no abdominal tenderness.  Skin:    General: Skin is warm and dry.     Capillary Refill: Capillary refill takes less than 2 seconds.  Neurological:     Mental Status: She is alert and oriented to person, place, and time.  Psychiatric:        Mood and Affect: Mood normal.      Labs on Admission: I have personally reviewed following labs and imaging studies  CBC: Recent Labs  Lab 10/28/22 1821  WBC 12.6*  HGB 17.8*  HCT 50.6*  MCV 88.2  PLT 355   Basic Metabolic Panel: Recent Labs  Lab 10/28/22 1821  NA 142  K 3.6  CL 104  CO2 18*  GLUCOSE 148*  BUN 16  CREATININE 1.34*  CALCIUM 10.5*   GFR: Estimated Creatinine Clearance: 35.3 mL/min (A) (by C-G formula based on SCr of 1.34 mg/dL (H)). Liver Function Tests: Recent Labs  Lab 10/29/22 0002  AST 27  ALT 22  ALKPHOS 109  BILITOT 1.1  PROT 6.0*  ALBUMIN 3.3*   Recent Labs  Lab 10/29/22 0002  LIPASE 33   No results for input(s): "AMMONIA" in the last 168 hours. Coagulation Profile: No results for input(s): "INR", "PROTIME" in the last 168 hours. Cardiac Enzymes: Recent Labs  Lab 10/28/22 1821 10/28/22 2026  TROPONINIHS 15 12   BNP (last 3 results) No results for input(s): "BNP" in the last 8760 hours. HbA1C: No results for input(s): "HGBA1C" in the last 72 hours. CBG: No results for input(s): "GLUCAP" in the last 168 hours. Lipid Profile: No results for input(s): "CHOL", "HDL", "LDLCALC", "TRIG", "CHOLHDL", "LDLDIRECT" in the last 72 hours. Thyroid Function Tests: No results for input(s): "TSH", "T4TOTAL", "FREET4", "T3FREE", "THYROIDAB" in the last 72 hours. Anemia Panel: No results for input(s): "VITAMINB12", "FOLATE", "FERRITIN", "TIBC", "IRON",  "RETICCTPCT" in the last 72 hours. Urine analysis:    Component Value Date/Time   COLORURINE YELLOW 05/18/2021 1631   APPEARANCEUR CLEAR 05/18/2021 1631   LABSPEC 1.024 05/18/2021 1631   PHURINE 5.5 05/18/2021 1631   GLUCOSEU NEGATIVE 05/18/2021 1631   HGBUR NEGATIVE 05/18/2021 1631   BILIRUBINUR NEGATIVE 05/18/2021 1631   BILIRUBINUR neg 11/19/2016 1013   KETONESUR 15 (A) 05/18/2021 1631   PROTEINUR TRACE (A) 05/18/2021 1631   UROBILINOGEN 0.2 11/19/2016 1013   NITRITE NEGATIVE 05/18/2021 1631   LEUKOCYTESUR SMALL (A) 05/18/2021 1631    Radiological Exams on Admission: I have personally reviewed images CT ABDOMEN PELVIS W CONTRAST  Result Date: 10/28/2022 CLINICAL DATA:  Acute nonlocalized abdominal pain. Diarrhea. History of pancreatitis EXAM: CT ABDOMEN AND PELVIS WITH CONTRAST TECHNIQUE: Multidetector CT imaging of the abdomen and pelvis was performed using the standard protocol following bolus administration of intravenous contrast. RADIATION DOSE REDUCTION: This exam was performed according to the departmental dose-optimization program which includes automated exposure control, adjustment of the mA and/or kV according to patient size and/or use of iterative reconstruction technique. CONTRAST:  60mL OMNIPAQUE IOHEXOL 350 MG/ML SOLN COMPARISON:  CT abdomen and pelvis 05/18/2021 FINDINGS: Lower chest: No acute abnormality. Hepatobiliary: Mild hepatic steatosis. Cholecystectomy. No biliary dilation. Pancreas: Unremarkable. Spleen: Unremarkable. Adrenals/Urinary Tract: Stable adrenal glands. No urinary calculi or hydronephrosis. Unremarkable bladder. Stomach/Bowel: Normal caliber large and small bowel. Enteric contrast within the colon. Normal appendix. Stomach is within normal limits. Vascular/Lymphatic: Mild aortic atherosclerotic calcification. No lymphadenopathy. Reproductive: Uterus and bilateral adnexa are unremarkable. Other: No free intraperitoneal fluid or air. Fat containing  umbilical hernia. Musculoskeletal: No acute fracture. Advanced thoracolumbar spondylosis. Remote compression deformity of the superior endplate of L4. IMPRESSION: 1. No acute findings in the abdomen or pelvis. 2. Mild hepatic steatosis. Aortic Atherosclerosis (ICD10-I70.0). Electronically Signed   By: Minerva Fester M.D.   On: 10/28/2022 21:48   DG Chest 2 View  Result Date: 10/28/2022 CLINICAL DATA:  Abdominal pain EXAM: CHEST - 2 VIEW COMPARISON:  Radiographs dated Sep 28, 2022 FINDINGS: The heart size and mediastinal contours are within normal limits. Both lungs are  clear. Left shoulder arthroplasty. Bilateral breast implants incidentally noted. IMPRESSION: No active cardiopulmonary disease. Electronically Signed   By: Larose Hires D.O.   On: 10/28/2022 19:20    EKG: My personal interpretation of EKG shows: Sinus tachycardia.    Assessment/Plan: Principal Problem:   Viral gastroenteritis Active Problems:   Acute diarrhea   Hypothyroidism, postsurgical   Major depressive disorder, recurrent episode, moderate (HCC)   Reactive airway disease   AKI (acute kidney injury) (HCC)   Suspecting Clostridium difficile enteritis-from exposure to azithromycin    Assessment and Plan: No notes have been filed under this hospital service. Service: Hospitalist  Acute onset of diarrhea Reactive leukocytosis in the setting of gastroenteritis Reflex tachycardia  Acute 1 set of diarrhea 4-5 loose stooling day for last 3 days.  Patient has been recently treated with azithromycin for sinus infection.  Checked on the chart. Patient has recent exposure to azithromycin in May 24. Acute diarrhea can be viral gastroenteritis versus noninvasive C. Difficile -Patient has AKI but creatinine is less than 1.5 and has leukocytosis less than 15 -Checking C. difficile stool PCR, GI panel and stool ova parasite. - Starting fidaxomicin 200 mg twice daily for 10 days.  If C. difficile infection rules out can stop the  fidaxomicin. -Continue IV fluid LR 100 cc/h - Started regular diet.  -Monitor CBC and BMP -Continue enteric and contact precaution until Clostridium enteritis rules out.  Prerenal AKI Present AKI secondary from poor oral intake and dehydration in the setting of acute diarrhea  -Creatinine has been trending up 1.34 (normal renal function at baseline) -Continue IV hydration LR 100 cc/h and encourage oral fluid intake.  High anion gap metabolic acidosis -Low bicarb 18 and elevated ion gap 20. Patient denies any history of diabetes, patient is not any SGLT2 inhibitor, on presentation blood glucose within normal range no concern for DKA. No concern for any ingestion of methanol, ethanol and polyethylene glycol. High anion gap metabolic acidosis likely in the setting of AKI. -Checking lactic acid level. - Continue to monitor.  Hoping that once renal function improves bicarb & anion gap will improve as well.  History of hypothyroidism - Continue levothyroxine 50 mcg daily. -Checking TSH level  History of major depressive disorder - Continue home dose of Wellbutrin XL 150 mg daily and Effexor 75 mg daily   DVT prophylaxis: SQ Heparin Code Status: Full Code.  Discussed with patient Diet: Regular diet Family Communication: None Disposition Plan: Discontinue fidaxomicin once C. difficile ruled out. Admission status: Observation, Med-Surg   Tereasa Coop, MD [facility] 10/29/2022, 3:48 AM    If 7PM-7AM, please contact night-coverage [email or web address] Password [email or web address]

## 2022-10-29 NOTE — ED Notes (Signed)
ED TO INPATIENT HANDOFF REPORT  ED Nurse Name and Phone #: Merlyn Albert PM [phone]  S Name/Age/Gender Robin Valencia 77 y.o. female Room/Bed: 002C/002C  Code Status   Code Status: Not on file  Home/SNF/Other Home Patient oriented to: self, place, time, and situation Is this baseline? Yes   Triage Complete: Triage complete  Chief Complaint Viral gastroenteritis [A08.4]  Triage Note Abdpain diarrhea feeling weak and she feels dry since thursday   Allergies Allergies  Allergen Reactions   Codeine Nausea And Vomiting   Gabapentin Nausea And Vomiting   Sulfa Antibiotics Diarrhea    Level of Care/Admitting Diagnosis ED Disposition     ED Disposition  Admit   Condition  --   Comment  Hospital Area: [facility] [[id number]]  Level of Care: Med-Surg [16]  May place patient in observation at [facility] or [facility] if equivalent level of care is available:: No  Covid Evaluation: Asymptomatic - no recent exposure (last 10 days) testing not required  Diagnosis: Viral gastroenteritis [454098]  Admitting Physician: Tereasa Coop [[id number]]  Attending Physician: Tereasa Coop [[id number]]          B Medical/Surgery History Past Medical History:  Diagnosis Date   Arthritis    Breast cancer of upper-outer quadrant of right female breast (HCC)    Depression    Eating disorder    History of stomach ulcers    Hypothyroidism    Jaundice    Pancreatitis 2009   Personal history of chemotherapy    Personal history of radiation therapy    Reflux esophagitis    Past Surgical History:  Procedure Laterality Date   BREAST LUMPECTOMY     BREAST SURGERY     CESAREAN SECTION     CESAREAN SECTION WITH BILATERAL TUBAL LIGATION     CHOLECYSTECTOMY     EXPLORATORY LAPAROTOMY     pancreatits due to gallbladder surgery   MASTECTOMY Right    THYROIDECTOMY     TONSILLECTOMY     TOTAL SHOULDER ARTHROPLASTY Left 06/23/2019   Procedure: LEFT TOTAL SHOULDER  ARTHROPLASTY;  Surgeon: Teryl Lucy, MD;  Location: [facility];  Service: Orthopedics;  Laterality: Left;     A IV Location/Drains/Wounds Patient Lines/Drains/Airways Status     Active Line/Drains/Airways     Name Placement date Placement time Site Days   Peripheral IV 10/28/22 20 G Anterior;Right Wrist 10/28/22  --  Wrist  1   Incision (Closed) 06/23/19 Shoulder Left 06/23/19  1102  -- 1224            Intake/Output Last 24 hours  Intake/Output Summary (Last 24 hours) at 10/29/2022 0311 Last data filed at 10/28/2022 2358 Gross per 24 hour  Intake 750 ml  Output --  Net 750 ml    Labs/Imaging Results for orders placed or performed during the hospital encounter of 10/28/22 (from the past 48 hour(s))  Basic metabolic panel     Status: Abnormal   Collection Time: 10/28/22  6:21 PM  Result Value Ref Range   Sodium 142 135 - 145 mmol/L   Potassium 3.6 3.5 - 5.1 mmol/L   Chloride 104 98 - 111 mmol/L   CO2 18 (L) 22 - 32 mmol/L   Glucose, Bld 148 (H) 70 - 99 mg/dL    Comment: Glucose reference range applies only to samples taken after fasting for at least 8 hours.   BUN 16 8 - 23 mg/dL   Creatinine, Ser 0.86 (H) 0.44 - 1.00 mg/dL  Calcium 10.5 (H) 8.9 - 10.3 mg/dL   GFR, Estimated 41 (L) >60 mL/min    Comment: (NOTE) Calculated using the CKD-EPI Creatinine Equation (2021)    Anion gap 20 (H) 5 - 15    Comment: Performed at [facility], [address], [place], [place] [address]  CBC     Status: Abnormal   Collection Time: 10/28/22  6:21 PM  Result Value Ref Range   WBC 12.6 (H) 4.0 - 10.5 K/uL   RBC 5.74 (H) 3.87 - 5.11 MIL/uL   Hemoglobin 17.8 (H) 12.0 - 15.0 g/dL   HCT 60.4 (H) 54.0 - 98.1 %   MCV 88.2 80.0 - 100.0 fL   MCH 31.0 26.0 - 34.0 pg   MCHC 35.2 30.0 - 36.0 g/dL   RDW 19.1 47.8 - 29.5 %   Platelets 355 150 - 400 K/uL   nRBC 0.0 0.0 - 0.2 %    Comment: Performed at [facility], [address] [place], [place] [address]   Troponin I (High Sensitivity)     Status: None   Collection Time: 10/28/22  6:21 PM  Result Value Ref Range   Troponin I (High Sensitivity) 15 <18 ng/L    Comment: (NOTE) Elevated high sensitivity troponin I (hsTnI) values and significant  changes across serial measurements may suggest ACS but many other  chronic and acute conditions are known to elevate hsTnI results.  Refer to the "Links" section for chest pain algorithms and additional  guidance. Performed at [facility], [address] [place], [place] [address]   Troponin I (High Sensitivity)     Status: None   Collection Time: 10/28/22  8:26 PM  Result Value Ref Range   Troponin I (High Sensitivity) 12 <18 ng/L    Comment: (NOTE) Elevated high sensitivity troponin I (hsTnI) values and significant  changes across serial measurements may suggest ACS but many other  chronic and acute conditions are known to elevate hsTnI results.  Refer to the "Links" section for chest pain algorithms and additional  guidance. Performed at [facility], [address], [place], [place] [address]   Lipase, blood     Status: None   Collection Time: 10/29/22 12:02 AM  Result Value Ref Range   Lipase 33 11 - 51 U/L    Comment: Performed at [facility], [address], [place], [place] [address]  Hepatic function panel     Status: Abnormal   Collection Time: 10/29/22 12:02 AM  Result Value Ref Range   Total Protein 6.0 (L) 6.5 - 8.1 g/dL   Albumin 3.3 (L) 3.5 - 5.0 g/dL   AST 27 15 - 41 U/L    Comment: HEMOLYSIS AT THIS LEVEL MAY AFFECT RESULT CORRECTED ON 06/24 AT 0055: PREVIOUSLY REPORTED AS 27 HEMOLYSIS AT THIS LEVEL MAY AFFECT RESULT HEMOLYSIS AT THIS LEVEL MAY AFFECT RESULT    ALT 22 0 - 44 U/L    Comment: HEMOLYSIS AT THIS LEVEL MAY AFFECT RESULT CORRECTED ON 06/24 AT 0055: PREVIOUSLY REPORTED AS 22 HEMOLYSIS AT THIS LEVEL MAY AFFECT RESULT HEMOLYSIS AT THIS LEVEL MAY AFFECT RESULT    Alkaline Phosphatase 109 38  - 126 U/L   Total Bilirubin 1.1 0.3 - 1.2 mg/dL    Comment: HEMOLYSIS AT THIS LEVEL MAY AFFECT RESULT CORRECTED ON 06/24 AT 0055: PREVIOUSLY REPORTED AS 1.1 HEMOLYSIS AT THIS LEVEL MAY AFFECT RESULT HEMOLYSIS AT THIS LEVEL MAY AFFECT RESULT  Bilirubin, Direct 0.3 (H) 0.0 - 0.2 mg/dL   Indirect Bilirubin 0.8 0.3 - 0.9 mg/dL    Comment: Performed at [facility], [address] [place], [place] [address]   CT ABDOMEN PELVIS W CONTRAST  Result Date: 10/28/2022 CLINICAL DATA:  Acute nonlocalized abdominal pain. Diarrhea. History of pancreatitis EXAM: CT ABDOMEN AND PELVIS WITH CONTRAST TECHNIQUE: Multidetector CT imaging of the abdomen and pelvis was performed using the standard protocol following bolus administration of intravenous contrast. RADIATION DOSE REDUCTION: This exam was performed according to the departmental dose-optimization program which includes automated exposure control, adjustment of the mA and/or kV according to patient size and/or use of iterative reconstruction technique. CONTRAST:  60mL OMNIPAQUE IOHEXOL 350 MG/ML SOLN COMPARISON:  CT abdomen and pelvis 05/18/2021 FINDINGS: Lower chest: No acute abnormality. Hepatobiliary: Mild hepatic steatosis. Cholecystectomy. No biliary dilation. Pancreas: Unremarkable. Spleen: Unremarkable. Adrenals/Urinary Tract: Stable adrenal glands. No urinary calculi or hydronephrosis. Unremarkable bladder. Stomach/Bowel: Normal caliber large and small bowel. Enteric contrast within the colon. Normal appendix. Stomach is within normal limits. Vascular/Lymphatic: Mild aortic atherosclerotic calcification. No lymphadenopathy. Reproductive: Uterus and bilateral adnexa are unremarkable. Other: No free intraperitoneal fluid or air. Fat containing umbilical hernia. Musculoskeletal: No acute fracture. Advanced thoracolumbar spondylosis. Remote compression deformity of the superior endplate of L4. IMPRESSION: 1. No acute findings in the abdomen or pelvis. 2.  Mild hepatic steatosis. Aortic Atherosclerosis (ICD10-I70.0). Electronically Signed   By: Minerva Fester M.D.   On: 10/28/2022 21:48   DG Chest 2 View  Result Date: 10/28/2022 CLINICAL DATA:  Abdominal pain EXAM: CHEST - 2 VIEW COMPARISON:  Radiographs dated Sep 28, 2022 FINDINGS: The heart size and mediastinal contours are within normal limits. Both lungs are clear. Left shoulder arthroplasty. Bilateral breast implants incidentally noted. IMPRESSION: No active cardiopulmonary disease. Electronically Signed   By: Larose Hires D.O.   On: 10/28/2022 19:20    Pending Labs Unresulted Labs (From admission, onward)     Start     Ordered   10/28/22 2311  Gastrointestinal Panel by PCR , Stool  (Gastrointestinal Panel by PCR, Stool                                                                                                                                                     **Does Not include CLOSTRIDIUM DIFFICILE testing. **If CDIFF testing is needed, place order from the "C Difficile Testing" order set.**)  Once,   URGENT        10/28/22 2310   10/28/22 2310  C Difficile Quick Screen w PCR reflex  (C Difficile quick screen w PCR reflex panel )  Once, for 24 hours,   URGENT       References:    CDiff Information Tool   10/28/22 2310  Vitals/Pain Today's Vitals   10/28/22 2100 10/29/22 0020 10/29/22 0020 10/29/22 0234  BP: 118/66 (!) 149/63    Pulse: 89 91    Resp: 18 (!) 23    Temp:   97.9 F (36.6 C)   TempSrc:   Oral   SpO2: 98% 100%    Weight:      Height:      PainSc:    Asleep    Isolation Precautions Enteric precautions (UV disinfection)  Medications Medications  lactated ringers infusion ( Intravenous New Bag/Given 10/29/22 0005)  sodium chloride 0.9 % bolus 500 mL (0 mLs Intravenous Stopped 10/28/22 2105)  iohexol (OMNIPAQUE) 350 MG/ML injection 60 mL (60 mLs Intravenous Contrast Given 10/28/22 2139)    Mobility walks with person assist     Focused  Assessments     R Recommendations: See Admitting Provider Note  Report given to:   Additional Notes:

## 2022-10-29 NOTE — Progress Notes (Signed)
Pt has arrived on unit and stable, will continue to monitor

## 2022-10-29 NOTE — Hospital Course (Signed)
[name] was admitted to the hospital with the working diagnosis of acute gastroenteritis.   77 yo female with the past medical history of hypothyroidism, anxiety, breast cancer, and reactive airway diease who presented with abdominal pain and diarrhea. Worsening diarrhea at home for the last 3 days, associated with generalized abdominal pain, not tolerating po intake. Recent antibiotic exposure with azithromycin for sinus infection on  09/28/2022. On her initial physical examination her HR 124, RR 24, blood pressure 110/76, and 02 saturation 95%, lungs with no wheezing or rales, heart with S1 and S2 present and tachycardic, abdomen with no distention and no lower extremity edema.   Na 142, K 3,6 Cl 104, bicarbonate 18, glucose 148, bun 16 cr 1,34.  Wbc 12.6 hgb 17,8 plt 355   Stool negative for C diff. Gastrointestinal panel negative Ova parasite pending.   CT abdomen and pelvis with no acute findings, mild hepatic steatosis.   Chest radiograph with no infiltrates or effusions, no cardiomegaly.   EKG 117 bpm, normal axis, manually corrected qtc 444, sinus rhythm with no significant ST segment or T wave changes.   Patient was placed on supportive medical therapy with improvement in her symptoms.   06/25 plan to follow up as outpatient.

## 2022-10-30 ENCOUNTER — Encounter (HOSPITAL_COMMUNITY): Payer: Self-pay | Admitting: Internal Medicine

## 2022-10-30 DIAGNOSIS — E890 Postprocedural hypothyroidism: Secondary | ICD-10-CM | POA: Diagnosis not present

## 2022-10-30 DIAGNOSIS — N179 Acute kidney failure, unspecified: Secondary | ICD-10-CM | POA: Diagnosis not present

## 2022-10-30 DIAGNOSIS — R197 Diarrhea, unspecified: Secondary | ICD-10-CM | POA: Diagnosis not present

## 2022-10-30 DIAGNOSIS — F331 Major depressive disorder, recurrent, moderate: Secondary | ICD-10-CM | POA: Diagnosis not present

## 2022-10-30 LAB — BASIC METABOLIC PANEL
Anion gap: 9 (ref 5–15)
BUN: 12 mg/dL (ref 8–23)
CO2: 21 mmol/L — ABNORMAL LOW (ref 22–32)
Calcium: 8.8 mg/dL — ABNORMAL LOW (ref 8.9–10.3)
Chloride: 109 mmol/L (ref 98–111)
Creatinine, Ser: 0.8 mg/dL (ref 0.44–1.00)
GFR, Estimated: >60 mL/min (ref >60)
Glucose, Bld: 110 mg/dL — ABNORMAL HIGH (ref 70–99)
Potassium: 4 mmol/L (ref 3.5–5.1)
Sodium: 139 mmol/L (ref 135–145)

## 2022-10-30 LAB — CBC
HCT: 42.2 % (ref 36.0–46.0)
Hemoglobin: 14.3 g/dL (ref 12.0–15.0)
MCH: 30.8 pg (ref 26.0–34.0)
MCHC: 33.9 g/dL (ref 30.0–36.0)
MCV: 90.8 fL (ref 80.0–100.0)
Platelets: 248 10*3/uL (ref 150–400)
RBC: 4.65 MIL/uL (ref 3.87–5.11)
RDW: 13.1 % (ref 11.5–15.5)
WBC: 8.3 10*3/uL (ref 4.0–10.5)
nRBC: 0 % (ref 0.0–0.2)

## 2022-10-30 LAB — MAGNESIUM: Magnesium: 1.9 mg/dL (ref 1.7–2.4)

## 2022-10-30 NOTE — Discharge Summary (Signed)
Physician Discharge Summary   Patient: Robin Valencia MRN: [id number] DOB: Mar 10, 1946  Admit date:     10/28/2022  Discharge date: 10/30/22  Discharge Physician: York Ram Lollie Gunner   PCP: Myrlene Broker, MD   Recommendations at discharge:    Follow up with Dr Okey Dupre in 7 to 10 days. Resume home medications.   Discharge Diagnoses: Active Problems:   Acute diarrhea   AKI (acute kidney injury) (HCC)   Hypothyroidism, postsurgical   Major depressive disorder, recurrent episode, moderate (HCC)   Reactive airway disease  Resolved Problems:   * No resolved hospital problems. [facility] Course: Robin Valencia was admitted to the hospital with the working diagnosis of acute gastroenteritis.   77 yo female with the past medical history of hypothyroidism, anxiety, breast cancer, and reactive airway diease who presented with abdominal pain and diarrhea. Worsening diarrhea at home for the last 3 days, associated with generalized abdominal pain, not tolerating po intake. Recent antibiotic exposure with azithromycin for sinus infection on  09/28/2022. On her initial physical examination her HR 124, RR 24, blood pressure 110/76, and 02 saturation 95%, lungs with no wheezing or rales, heart with S1 and S2 present and tachycardic, abdomen with no distention and no lower extremity edema.   Na 142, K 3,6 Cl 104, bicarbonate 18, glucose 148, bun 16 cr 1,34.  Wbc 12.6 hgb 17,8 plt 355   Stool negative for C diff. Gastrointestinal panel negative Ova parasite pending.   CT abdomen and pelvis with no acute findings, mild hepatic steatosis.   Chest radiograph with no infiltrates or effusions, no cardiomegaly.   EKG 117 bpm, normal axis, manually corrected qtc 444, sinus rhythm with no significant ST segment or T wave changes.   Patient was placed on supportive medical therapy with improvement in her symptoms.   06/25 plan to follow up as outpatient.    Assessment and Plan: Acute  diarrhea C diff and stool panel negative.  Diarrhea has been progressively improving, no nausea or vomiting.   Plan to continue supportive care at home. Continue proton pump inhibitors.  Follow up as outpatient in 7 to 10 days.   AKI (acute kidney injury) (HCC) Hypokalemia   At the time of her discharge her renal function is stable with serum cr at 0,80 with K at 4,0 and Mg 1,9. Bicarbonate 21 and Na 139 Anion gap is 9.   Patient will continue with regular diet, and follow up renal function and electrolytes as outpatient.   Hypothyroidism, postsurgical Continue with levothyroxine.   Major depressive disorder, recurrent episode, moderate (HCC) Continue bupropion and venlafaxine.   Reactive airway disease Continue with fluticasone and vilanterol combination.  No acute exacerbation.          Consultants: none  Procedures performed: none   Disposition: Home Diet recommendation:  Regular diet DISCHARGE MEDICATION: Allergies as of 10/30/2022       Reactions   Codeine Nausea And Vomiting   Gabapentin Nausea And Vomiting   Sulfa Antibiotics Diarrhea        Medication List     TAKE these medications    bismuth subsalicylate 262 MG chewable tablet Commonly known as: PEPTO BISMOL Chew 524 mg by mouth as needed for diarrhea or loose stools.   buPROPion 150 MG 24 hr tablet Commonly known as: WELLBUTRIN XL Take 150 mg by mouth daily.   fluticasone-salmeterol 100-50 MCG/ACT Aepb Commonly known as: Wixela Inhub INHALE 1 PUFF INTO THE LUNGS TWICE DAILY   levothyroxine  50 MCG tablet Commonly known as: SYNTHROID TAKE 1 TABLET BY MOUTH DAILY  BEFORE BREAKFAST   LORazepam 0.5 MG tablet Commonly known as: ATIVAN Take 0.5 mg by mouth 2 (two) times daily as needed for anxiety.   omeprazole 20 MG capsule Commonly known as: PRILOSEC TAKE 1 CAPSULE BY MOUTH  TWICE DAILY What changed: when to take this   venlafaxine XR 75 MG 24 hr capsule Commonly known as:  EFFEXOR-XR Take 75 mg by mouth daily.   vitamin B-12 100 MCG tablet Commonly known as: CYANOCOBALAMIN Take 100 mcg by mouth daily.        Follow-up Information     Myrlene Broker, MD Follow up.   Specialty: Internal Medicine Contact information: [address] [place] [address] [phone]                Discharge Exam: Ceasar Mons Weights   10/28/22 1813 10/29/22 0500  Weight: 72.1 kg 70.4 kg   BP 120/64 (BP Location: Right Arm)   Pulse 83   Temp (!) 97.5 F (36.4 C)   Resp 18   Ht 5\' 4"  (1.626 m)   Wt 70.4 kg   SpO2 96%   BMI 26.64 kg/m   Patient is feeling better, diarrhea continue to improve, she continue to tolerate po with no nausea or vomiting.   Neurology awake and alert ENT with no pallor Cardiovascular with S1 and S2 present and rhythmic with no gallops, rubs or murmurs Respiratory with no rales or wheezing Abdomen with no distention  No lower extremity edema   Condition at discharge: stable  The results of significant diagnostics from this hospitalization (including imaging, microbiology, ancillary and laboratory) are listed below for reference.   Imaging Studies: CT ABDOMEN PELVIS W CONTRAST  Result Date: 10/28/2022 CLINICAL DATA:  Acute nonlocalized abdominal pain. Diarrhea. History of pancreatitis EXAM: CT ABDOMEN AND PELVIS WITH CONTRAST TECHNIQUE: Multidetector CT imaging of the abdomen and pelvis was performed using the standard protocol following bolus administration of intravenous contrast. RADIATION DOSE REDUCTION: This exam was performed according to the departmental dose-optimization program which includes automated exposure control, adjustment of the mA and/or kV according to patient size and/or use of iterative reconstruction technique. CONTRAST:  60mL OMNIPAQUE IOHEXOL 350 MG/ML SOLN COMPARISON:  CT abdomen and pelvis 05/18/2021 FINDINGS: Lower chest: No acute abnormality. Hepatobiliary: Mild hepatic steatosis.  Cholecystectomy. No biliary dilation. Pancreas: Unremarkable. Spleen: Unremarkable. Adrenals/Urinary Tract: Stable adrenal glands. No urinary calculi or hydronephrosis. Unremarkable bladder. Stomach/Bowel: Normal caliber large and small bowel. Enteric contrast within the colon. Normal appendix. Stomach is within normal limits. Vascular/Lymphatic: Mild aortic atherosclerotic calcification. No lymphadenopathy. Reproductive: Uterus and bilateral adnexa are unremarkable. Other: No free intraperitoneal fluid or air. Fat containing umbilical hernia. Musculoskeletal: No acute fracture. Advanced thoracolumbar spondylosis. Remote compression deformity of the superior endplate of L4. IMPRESSION: 1. No acute findings in the abdomen or pelvis. 2. Mild hepatic steatosis. Aortic Atherosclerosis (ICD10-I70.0). Electronically Signed   By: Minerva Fester M.D.   On: 10/28/2022 21:48   DG Chest 2 View  Result Date: 10/28/2022 CLINICAL DATA:  Abdominal pain EXAM: CHEST - 2 VIEW COMPARISON:  Radiographs dated Sep 28, 2022 FINDINGS: The heart size and mediastinal contours are within normal limits. Both lungs are clear. Left shoulder arthroplasty. Bilateral breast implants incidentally noted. IMPRESSION: No active cardiopulmonary disease. Electronically Signed   By: Larose Hires D.O.   On: 10/28/2022 19:20    Microbiology: Results for orders placed or performed during the hospital  encounter of 10/28/22  C Difficile Quick Screen w PCR reflex     Status: None   Collection Time: 10/28/22 11:10 PM   Specimen: Stool  Result Value Ref Range Status   C Diff antigen NEGATIVE NEGATIVE Final   C Diff toxin NEGATIVE NEGATIVE Final   C Diff interpretation No C. difficile detected.  Final    Comment: Performed at [facility], [address], [place], [place] [address]  Gastrointestinal Panel by PCR , Stool     Status: None   Collection Time: 10/28/22 11:11 PM   Specimen: Stool  Result Value Ref Range Status   Campylobacter  species NOT DETECTED NOT DETECTED Final   Plesimonas shigelloides NOT DETECTED NOT DETECTED Final   Salmonella species NOT DETECTED NOT DETECTED Final   Yersinia enterocolitica NOT DETECTED NOT DETECTED Final   Vibrio species NOT DETECTED NOT DETECTED Final   Vibrio cholerae NOT DETECTED NOT DETECTED Final   Enteroaggregative E coli (EAEC) NOT DETECTED NOT DETECTED Final   Enteropathogenic E coli (EPEC) NOT DETECTED NOT DETECTED Final   Enterotoxigenic E coli (ETEC) NOT DETECTED NOT DETECTED Final   Shiga like toxin producing E coli (STEC) NOT DETECTED NOT DETECTED Final   Shigella/Enteroinvasive E coli (EIEC) NOT DETECTED NOT DETECTED Final   Cryptosporidium NOT DETECTED NOT DETECTED Final   Cyclospora cayetanensis NOT DETECTED NOT DETECTED Final   Entamoeba histolytica NOT DETECTED NOT DETECTED Final   Giardia lamblia NOT DETECTED NOT DETECTED Final   Adenovirus F40/41 NOT DETECTED NOT DETECTED Final   Astrovirus NOT DETECTED NOT DETECTED Final   Norovirus GI/GII NOT DETECTED NOT DETECTED Final   Rotavirus A NOT DETECTED NOT DETECTED Final   Sapovirus (I, II, IV, and V) NOT DETECTED NOT DETECTED Final    Comment: Performed at [facility], [address] [place], [place] [address]    Labs: CBC: Recent Labs  Lab 10/28/22 1821 10/29/22 0345 10/30/22 0446  WBC 12.6* 8.1 8.3  HGB 17.8* 15.5* 14.3  HCT 50.6* 44.3 42.2  MCV 88.2 90.2 90.8  PLT 355 272 248   Basic Metabolic Panel: Recent Labs  Lab 10/28/22 1821 10/29/22 0345 10/30/22 0446  NA 142 138 139  K 3.6 3.2* 4.0  CL 104 105 109  CO2 18* 22 21*  GLUCOSE 148* 102* 110*  BUN 16 15 12   CREATININE 1.34* 0.81 0.80  CALCIUM 10.5* 9.1 8.8*  MG  --   --  1.9   Liver Function Tests: Recent Labs  Lab 10/29/22 0002 10/29/22 0345  AST 27 19  ALT 22 24  ALKPHOS 109 110  BILITOT 1.1 1.1  PROT 6.0* 6.1*  ALBUMIN 3.3* 3.4*   CBG: No results for input(s): "GLUCAP" in the last 168 hours.  Discharge time  spent: greater than 30 minutes.  Signed: Coralie Keens, MD [facility] 10/30/2022

## 2022-10-30 NOTE — Progress Notes (Signed)
OT Cancellation Note  Patient Details Name: Robin Valencia MRN: [id number] DOB: 04/11/1946   Cancelled Treatment:    Reason Eval/Treat Not Completed: OT screened, no needs identified, will sign off (Discussed with PT, pt does not have acute needs. OT to sign off. Thank you.)  Donia Pounds 10/30/2022, 10:45 AM

## 2022-10-30 NOTE — Evaluation (Signed)
Physical Therapy Evaluation Patient Details Name: Robin Valencia MRN: [id number] DOB: Feb 14, 1946 Today's Date: 10/30/2022  History of Present Illness  Robin Valencia is a 77 y.o. female who presents with ongoing diarrhea for 3 days. Admitted with the working diagnosis of acute gastroenteritis. PMHx: generalized anxiety disorder, reactive airway disease, hypothyroidism and breast cancer.  Clinical Impression   Pt presents with slight weakness vs baseline given multiple day history of diarrhea, but overall demonstrates WFL balance, activity tolerance and mobility. Pt mobilizing in hallway with PT at mod I level for increased time initially, progressing to [facility] with continued gait. Pt with no further acute or post-acute PT needs at this time, PT to sign off, thank you for consult.         Recommendations for follow up therapy are one component of a multi-disciplinary discharge planning process, led by the attending physician.  Recommendations may be updated based on patient status, additional functional criteria and insurance authorization.  Follow Up Recommendations       Assistance Recommended at Discharge PRN  Patient can return home with the following  A little help with walking and/or transfers;A little help with bathing/dressing/bathroom    Equipment Recommendations None recommended by PT  Recommendations for Other Services       Functional Status Assessment Patient has had a recent decline in their functional status and demonstrates the ability to make significant improvements in function in a reasonable and predictable amount of time.     Precautions / Restrictions Precautions Precautions: None Restrictions Weight Bearing Restrictions: No      Mobility  Bed Mobility Overal bed mobility: Modified Independent                  Transfers Overall transfer level: Modified independent                      Ambulation/Gait Ambulation/Gait assistance: Modified  independent (Device/Increase time) Gait Distance (Feet): 350 Feet Assistive device: None Gait Pattern/deviations: Step-through pattern Gait velocity: slightly decr     General Gait Details: slightly increased time given multiple day history of diarrhea, but otherwise [facility]  Stairs            Wheelchair Mobility    Modified Rankin (Stroke Patients Only)       Balance Overall balance assessment: Mild deficits observed, not formally tested (tolerates head turns, step over object, directional change, fast/slow gait well)                                           Pertinent Vitals/Pain Pain Assessment Pain Assessment: Faces Faces Pain Scale: Hurts a little bit Pain Location: abdomen Pain Descriptors / Indicators: Discomfort Pain Intervention(s): Monitored during session    Home Living Family/patient expects to be discharged to:: Private residence Living Arrangements: Alone Available Help at Discharge: Friend(s);Available PRN/intermittently Type of Home: Apartment Home Access: Stairs to enter   Entrance Stairs-Number of Steps: 2   Home Layout: One level Home Equipment: None      Prior Function Prior Level of Function : Independent/Modified Independent                     Hand Dominance   Dominant Hand: Right    Extremity/Trunk Assessment   Upper Extremity Assessment Upper Extremity Assessment: Overall WFL for tasks assessed    Lower Extremity Assessment Lower Extremity Assessment:  Overall [facility] for tasks assessed    Cervical / Trunk Assessment Cervical / Trunk Assessment: Normal  Communication   Communication: No difficulties  Cognition Arousal/Alertness: Awake/alert Behavior During Therapy: WFL for tasks assessed/performed Overall Cognitive Status: Within Functional Limits for tasks assessed                                          General Comments      Exercises     Assessment/Plan    PT Assessment  Patient does not need any further PT services  PT Problem List         PT Treatment Interventions      PT Goals (Current goals can be found in the Care Plan section)  Acute Rehab PT Goals PT Goal Formulation: With patient Time For Goal Achievement: 10/30/22 Potential to Achieve Goals: Good    Frequency       Co-evaluation               AM-PAC PT "6 Clicks" Mobility  Outcome Measure Help needed turning from your back to your side while in a flat bed without using bedrails?: None Help needed moving from lying on your back to sitting on the side of a flat bed without using bedrails?: None Help needed moving to and from a bed to a chair (including a wheelchair)?: None Help needed standing up from a chair using your arms (e.g., wheelchair or bedside chair)?: None Help needed to walk in hospital room?: None Help needed climbing 3-5 steps with a railing? : A Little 6 Click Score: 23    End of Session   Activity Tolerance: Patient tolerated treatment well Patient left: in bed;with call bell/phone within reach Nurse Communication: Mobility status PT Visit Diagnosis: Other abnormalities of gait and mobility (R26.89)    Time: 8119-1478 PT Time Calculation (min) (ACUTE ONLY): 20 min   Charges:   PT Evaluation $PT Eval Low Complexity: 1 Low          Porcha Deblanc S, PT DPT Acute Rehabilitation Services Secure Chat Preferred  Office [phone]   Markice Torbert E Christain [place] 10/30/2022, 11:10 AM

## 2022-11-01 ENCOUNTER — Telehealth: Payer: Self-pay | Admitting: *Deleted

## 2022-11-01 ENCOUNTER — Encounter: Payer: Self-pay | Admitting: *Deleted

## 2022-11-01 LAB — O&P RESULT

## 2022-11-01 LAB — OVA + PARASITE EXAM

## 2022-11-01 NOTE — Transitions of Care (Post Inpatient/ED Visit) (Signed)
   11/01/2022  Name: Robin Valencia MRN: [id number] DOB: July 10, 1945  Today's TOC FU Call Status: Today's TOC FU Call Status:: Unsuccessul Call (1st Attempt) Unsuccessful Call (1st Attempt) Date: 11/01/22  Attempted to reach the patient regarding the most recent Inpatient visit; left HIPAA compliant voice message requesting call back  Follow Up Plan: Additional outreach attempts will be made to reach the patient to complete the Transitions of Care (Post Inpatient visit) call.  Caryl Pina, RN, BSN, CCRN Alumnus RN CM Care Coordination/ Transition of Care- [facility] Care Management [phone]: direct office

## 2022-11-02 ENCOUNTER — Telehealth: Payer: Self-pay | Admitting: *Deleted

## 2022-11-02 ENCOUNTER — Encounter: Payer: Self-pay | Admitting: *Deleted

## 2022-11-02 NOTE — Transitions of Care (Post Inpatient/ED Visit) (Signed)
   11/02/2022  Name: Robin Valencia MRN: [id number] DOB: September 01, 1945  Today's TOC FU Call Status: Today's TOC FU Call Status:: Unsuccessful Call (2nd Attempt) Unsuccessful Call (2nd Attempt) Date: 11/02/22  Attempted to reach the patient regarding the most recent Inpatient visit; left HIPAA compliant voice message requesting call back  Follow Up Plan: Additional outreach attempts will be made to reach the patient to complete the Transitions of Care (Post Inpatient visit) call.   Caryl Pina, RN, BSN, CCRN Alumnus RN CM Care Coordination/ Transition of Care- [facility] Care Management [phone]: direct office

## 2022-11-05 ENCOUNTER — Encounter: Payer: Self-pay | Admitting: *Deleted

## 2022-11-05 ENCOUNTER — Telehealth: Payer: Self-pay | Admitting: *Deleted

## 2022-11-05 NOTE — Transitions of Care (Post Inpatient/ED Visit) (Signed)
   11/05/2022  Name: Robin Valencia MRN: [id number] DOB: 1945/06/25  Today's TOC FU Call Status: Today's TOC FU Call Status:: Unsuccessful Call (3rd Attempt) Unsuccessful Call (3rd Attempt) Date: 11/05/22  Attempted to reach the patient regarding the most recent Inpatient visit; left HIPAA compliant voice message requesting call back  Follow Up Plan: No further outreach attempts will be made at this time. We have been unable to contact the patient.  Caryl Pina, RN, BSN, CCRN Alumnus RN CM Care Coordination/ Transition of Care- [facility] Care Management [phone]: direct office

## 2022-11-22 ENCOUNTER — Inpatient Hospital Stay: Payer: Medicare Other | Admitting: Internal Medicine

## 2022-12-15 ENCOUNTER — Other Ambulatory Visit: Payer: Self-pay | Admitting: Internal Medicine

## 2022-12-22 ENCOUNTER — Other Ambulatory Visit: Payer: Self-pay | Admitting: Internal Medicine

## 2023-01-20 DIAGNOSIS — J018 Other acute sinusitis: Secondary | ICD-10-CM | POA: Diagnosis not present

## 2023-01-20 DIAGNOSIS — R0602 Shortness of breath: Secondary | ICD-10-CM | POA: Diagnosis not present

## 2023-01-20 DIAGNOSIS — Z20822 Contact with and (suspected) exposure to covid-19: Secondary | ICD-10-CM | POA: Diagnosis not present

## 2023-02-05 DIAGNOSIS — F3181 Bipolar II disorder: Secondary | ICD-10-CM | POA: Diagnosis not present

## 2023-02-12 ENCOUNTER — Encounter: Payer: Self-pay | Admitting: Internal Medicine

## 2023-02-12 ENCOUNTER — Ambulatory Visit: Payer: Medicare Other | Admitting: Internal Medicine

## 2023-02-12 VITALS — BP 120/82 | HR 94 | Temp 98.1°F | Ht 64.0 in | Wt 157.0 lb

## 2023-02-12 DIAGNOSIS — Z23 Encounter for immunization: Secondary | ICD-10-CM

## 2023-02-12 DIAGNOSIS — J4530 Mild persistent asthma, uncomplicated: Secondary | ICD-10-CM

## 2023-02-12 MED ORDER — MONTELUKAST SODIUM 10 MG PO TABS: 10.0000 mg | ORAL_TABLET | Freq: Every day | ORAL | 3 refills | Status: DC

## 2023-02-12 MED ORDER — FLUTICASONE PROPIONATE 50 MCG/ACT NA SUSP: 2.0000 | Freq: Every day | NASAL | 6 refills | Status: AC

## 2023-02-12 NOTE — Patient Instructions (Signed)
We have sent in flonase to use 2 sprays each nostril daily (can use all the time or just during a flare).  We have sent in singulair which is 1 pill daily (can use all the time or just during a flare).

## 2023-02-12 NOTE — Progress Notes (Signed)
Subjective:   Patient ID: Robin Valencia, female    DOB: 11-17-1945, 77 y.o.   MRN: [id number]  HPI The patient is a 77 YO female coming in for headaches and sinus pressure.   Review of Systems  Constitutional: Negative.   HENT:  Positive for congestion and postnasal drip.   Eyes: Negative.   Respiratory:  Negative for cough, chest tightness and shortness of breath.   Cardiovascular:  Negative for chest pain, palpitations and leg swelling.  Gastrointestinal:  Negative for abdominal distention, abdominal pain, constipation, diarrhea, nausea and vomiting.  Musculoskeletal: Negative.   Skin: Negative.   Neurological: Negative.   Psychiatric/Behavioral: Negative.      Objective:  Physical Exam Constitutional:      Appearance: She is well-developed.  HENT:     Head: Normocephalic and atraumatic.  Cardiovascular:     Rate and Rhythm: Normal rate and regular rhythm.  Pulmonary:     Effort: Pulmonary effort is normal. No respiratory distress.     Breath sounds: Normal breath sounds. No wheezing or rales.  Abdominal:     General: Bowel sounds are normal. There is no distension.     Palpations: Abdomen is soft.     Tenderness: There is no abdominal tenderness. There is no rebound.  Musculoskeletal:     Cervical back: Normal range of motion.  Skin:    General: Skin is warm and dry.  Neurological:     Mental Status: She is alert and oriented to person, place, and time.     Coordination: Coordination normal.     Vitals:   02/12/23 1055  BP: 120/82  Pulse: 94  Temp: 98.1 F (36.7 C)  TempSrc: Oral  SpO2: 97%  Weight: 157 lb (71.2 kg)  Height: 5\' 4"  (1.626 m)    Assessment & Plan:  Flu shot given at visit

## 2023-02-14 ENCOUNTER — Encounter: Payer: Self-pay | Admitting: Internal Medicine

## 2023-02-14 NOTE — Assessment & Plan Note (Addendum)
Some increasing symptoms lately due to allergies. Rx singulair and no indication for steroids or antibiotics today. Given flu shot. Mild persistent and today wixela.

## 2023-03-11 DIAGNOSIS — M19041 Primary osteoarthritis, right hand: Secondary | ICD-10-CM | POA: Diagnosis not present

## 2023-03-11 DIAGNOSIS — M170 Bilateral primary osteoarthritis of knee: Secondary | ICD-10-CM | POA: Diagnosis not present

## 2023-06-07 DIAGNOSIS — R059 Cough, unspecified: Secondary | ICD-10-CM | POA: Diagnosis not present

## 2023-07-29 DIAGNOSIS — F3181 Bipolar II disorder: Secondary | ICD-10-CM | POA: Diagnosis not present

## 2023-08-02 ENCOUNTER — Other Ambulatory Visit: Payer: Self-pay | Admitting: Internal Medicine

## 2023-09-04 DIAGNOSIS — F3181 Bipolar II disorder: Secondary | ICD-10-CM | POA: Diagnosis not present

## 2023-10-10 DIAGNOSIS — L718 Other rosacea: Secondary | ICD-10-CM | POA: Diagnosis not present

## 2023-11-25 DIAGNOSIS — M25522 Pain in left elbow: Secondary | ICD-10-CM | POA: Diagnosis not present

## 2023-12-02 DIAGNOSIS — F3181 Bipolar II disorder: Secondary | ICD-10-CM | POA: Diagnosis not present

## 2024-03-05 ENCOUNTER — Other Ambulatory Visit: Payer: Self-pay | Admitting: Internal Medicine

## 2024-03-19 ENCOUNTER — Other Ambulatory Visit: Payer: Self-pay | Admitting: Internal Medicine

## 2024-04-14 ENCOUNTER — Telehealth: Payer: Self-pay

## 2024-04-14 NOTE — Telephone Encounter (Signed)
 Copied from CRM [id number]. Topic: General - Call Back - No Documentation >> Apr 14, 2024  4:16 PM Robin Valencia ORN wrote: Reason for CRM: pt called to return call from [facility]. Pt stated that the message mentioned a medication. Contacted Robin Valencia who stated Danielle will give pt a call back directly regarding medication once she is done with a pt. Please contact pt with update

## 2024-04-15 ENCOUNTER — Ambulatory Visit

## 2024-04-15 VITALS — Ht 64.0 in | Wt 157.0 lb

## 2024-04-15 DIAGNOSIS — Z0000 Encounter for general adult medical examination without abnormal findings: Secondary | ICD-10-CM

## 2024-04-15 DIAGNOSIS — Z1231 Encounter for screening mammogram for malignant neoplasm of breast: Secondary | ICD-10-CM

## 2024-04-15 DIAGNOSIS — Z780 Asymptomatic menopausal state: Secondary | ICD-10-CM | POA: Diagnosis not present

## 2024-04-15 NOTE — Patient Instructions (Signed)
 Ms. Arutyunyan,  Thank you for taking the time for your Medicare Wellness Visit. I appreciate your continued commitment to your health goals. Please review the care plan we discussed, and feel free to reach out if I can assist you further.  Please note that Annual Wellness Visits do not include a physical exam. Some assessments may be limited, especially if the visit was conducted virtually. If needed, we may recommend an in-person follow-up with your provider.  Ongoing Care Seeing your primary care provider every 3 to 6 months helps us  monitor your health and provide consistent, personalized care.   Referrals If a referral was made during today's visit and you haven't received any updates within two weeks, please contact the referred provider directly to check on the status.  Recommended Screenings:  Health Maintenance  Topic Date Due   Hepatitis C Screening  Never done   DTaP/Tdap/Td vaccine (1 - Tdap) Never done   Zoster (Shingles) Vaccine (1 of 2) Never done   Osteoporosis screening with Bone Density Scan  Never done   Pneumococcal Vaccine for age over 2 (2 of 2 - PCV) 07/22/2021   Breast Cancer Screening  02/14/2023   Flu Shot  12/06/2023   COVID-19 Vaccine (4 - 2025-26 season) 01/06/2024   Medicare Annual Wellness Visit  04/15/2025   Meningitis B Vaccine  Aged Out       04/15/2024    1:17 PM  Advanced Directives  Does Patient Have a Medical Advance Directive? Yes  Type of Estate Agent of [place];Living will  Does patient want to make changes to medical advance directive? Yes (Inpatient - patient requests chaplain consult to change a medical advance directive)  Copy of Healthcare Power of Attorney in Chart? No - copy requested    Vision: Annual vision screenings are recommended for early detection of glaucoma, cataracts, and diabetic retinopathy. These exams can also reveal signs of chronic conditions such as diabetes and high blood pressure.  Dental:  Annual dental screenings help detect early signs of oral cancer, gum disease, and other conditions linked to overall health, including heart disease and diabetes.

## 2024-04-15 NOTE — Progress Notes (Signed)
 Chief Complaint  Patient presents with   Medicare Wellness     Subjective:  Please attest and cosign this visit due to patients primary care provider not being in the office at the time the visit was completed.  (Pt of Dr Almarie Cleveland)   Robin Valencia is a 78 y.o. female who presents for a Medicare Annual Wellness Visit.  Visit info / Clinical Intake: Medicare Wellness Visit Type:: Subsequent Annual Wellness Visit Persons participating in visit and providing information:: patient Medicare Wellness Visit Mode:: Telephone If telephone:: video declined Since this visit was completed virtually, some vitals may be partially provided or unavailable. Missing vitals are due to the limitations of the virtual format.: Documented vitals are patient reported If Telephone or Video please confirm:: I connected with patient using audio/video enable telemedicine. I verified patient identity with two identifiers, discussed telehealth limitations, and patient agreed to proceed. Patient Location:: Home Provider Location:: Office Interpreter Needed?: No Pre-visit prep was completed: yes AWV questionnaire completed by patient prior to visit?: no Living arrangements:: (!) lives alone Patient's Overall Health Status Rating: good Typical amount of pain: none Does pain affect daily life?: no Are you currently prescribed opioids?: no  Dietary Habits and Nutritional Risks How many meals a day?: 2 Eats fruit and vegetables daily?: yes Most meals are obtained by: preparing own meals In the last 2 weeks, have you had any of the following?: none Diabetic:: no  Functional Status Activities of Daily Living (to include ambulation/medication): Independent Ambulation: Independent Medication Administration: Independent Home Management (perform basic housework or laundry): Independent Manage your own finances?: yes Primary transportation is: driving Concerns about vision?: no *vision screening is  required for WTM* Concerns about hearing?: no  Fall Screening Falls in the past year?: 1 (5-6) Number of falls in past year: 1 Was there an injury with Fall?: 1 (bruises on arms) Fall Risk Category Calculator: 3 Patient Fall Risk Level: High Fall Risk  Fall Risk Patient at Risk for Falls Due to: Impaired balance/gait Fall risk Follow up: Falls evaluation completed; Falls prevention discussed  Home and Transportation Safety: All rugs have non-skid backing?: N/A, no rugs All stairs or steps have railings?: N/A, no stairs Grab bars in the bathtub or shower?: yes Have non-skid surface in bathtub or shower?: yes Good home lighting?: yes Regular seat belt use?: yes Hospital stays in the last year:: no  Cognitive Assessment Difficulty concentrating, remembering, or making decisions? : no Will 6CIT or Mini Cog be Completed: yes What year is it?: 0 points What month is it?: 0 points Give patient an address phrase to remember (5 components): [address] [place], [place] About what time is it?: 0 points Count backwards from 20 to 1: 0 points Say the months of the year in reverse: 0 points Repeat the address phrase from earlier: 0 points 6 CIT Score: 0 points  Advance Directives (For Healthcare) Does Patient Have a Medical Advance Directive?: Yes Does patient want to make changes to medical advance directive?: Yes (Inpatient - patient requests chaplain consult to change a medical advance directive) Type of Advance Directive: Healthcare Power of Robin Valencia; Living will Copy of Healthcare Power of Attorney in Chart?: No - copy requested Copy of Living Will in Chart?: No - copy requested  Reviewed/Updated  Reviewed/Updated: Reviewed All (Medical, Surgical, Family, Medications, Allergies, Care Teams, Patient Goals)    Allergies (verified) Codeine, Gabapentin, and Sulfa antibiotics   Current Medications (verified) Outpatient Encounter Medications as of 04/15/2024  Medication Sig  bismuth subsalicylate (PEPTO BISMOL) 262 MG chewable tablet Chew 524 mg by mouth as needed for diarrhea or loose stools.   buPROPion  (WELLBUTRIN  XL) 150 MG 24 hr tablet Take 150 mg by mouth daily.   fluticasone  (FLONASE ) 50 MCG/ACT nasal spray Place 2 sprays into both nostrils daily.   fluticasone -salmeterol (WIXELA INHUB) 100-50 MCG/ACT AEPB INHALE 1 PUFF INTO THE LUNGS TWICE DAILY   levothyroxine  (SYNTHROID ) 50 MCG tablet TAKE 1 TABLET BY MOUTH DAILY  BEFORE BREAKFAST   LORazepam (ATIVAN) 0.5 MG tablet Take 0.5 mg by mouth 2 (two) times daily as needed for anxiety.   montelukast  (SINGULAIR ) 10 MG tablet TAKE 1 TABLET(10 MG) BY MOUTH AT BEDTIME   omeprazole  (PRILOSEC) 20 MG capsule TAKE 1 CAPSULE BY MOUTH TWICE  DAILY   venlafaxine  XR (EFFEXOR -XR) 75 MG 24 hr capsule Take 75 mg by mouth daily.   vitamin B-12 (CYANOCOBALAMIN ) 100 MCG tablet Take 100 mcg by mouth daily.   No facility-administered encounter medications on file as of 04/15/2024.    History: Past Medical History:  Diagnosis Date   Arthritis    Breast cancer of upper-outer quadrant of right female breast (HCC)    Depression    Eating disorder    History of stomach ulcers    Hypothyroidism    Jaundice    Pancreatitis 2009   Personal history of chemotherapy    Personal history of radiation therapy    Reflux esophagitis    Past Surgical History:  Procedure Laterality Date   BREAST LUMPECTOMY     BREAST SURGERY     CESAREAN SECTION     CESAREAN SECTION WITH BILATERAL TUBAL LIGATION     CHOLECYSTECTOMY     EXPLORATORY LAPAROTOMY     pancreatits due to gallbladder surgery   MASTECTOMY Right    THYROIDECTOMY     TONSILLECTOMY     TOTAL SHOULDER ARTHROPLASTY Left 06/23/2019   Procedure: LEFT TOTAL SHOULDER ARTHROPLASTY;  Surgeon: Josefina Chew, MD;  Location: [facility];  Service: Orthopedics;  Laterality: Left;   Family History  Problem Relation Age of Onset   Arthritis Mother    Mental illness  Mother    Depression Mother    Alcohol abuse Father    Hyperlipidemia Father    Heart disease Father    Stroke Father    Hypertension Father    Hypertension Sister    Mental illness Sister    Breast cancer Sister 32   Mental illness Brother    Depression Brother    Cancer Paternal Grandfather        prostate   Breast cancer Maternal Aunt    Social History   Occupational History   Occupation: Law firm  Tobacco Use   Smoking status: Never   Smokeless tobacco: Never  Vaping Use   Vaping status: Never Used  Substance and Sexual Activity   Alcohol use: No   Drug use: No   Sexual activity: Not Currently   Tobacco Counseling Counseling given: No  SDOH Screenings   Food Insecurity: No Food Insecurity (04/15/2024)  Housing: Unknown (04/15/2024)  Transportation Needs: No Transportation Needs (04/15/2024)  Utilities: Not At Risk (04/15/2024)  Alcohol Screen: Low Risk  (01/26/2022)  Depression (PHQ2-9): Low Risk  (04/15/2024)  Financial Resource Strain: Low Risk  (01/26/2022)  Physical Activity: Insufficiently Active (04/15/2024)  Social Connections: Socially Isolated (04/15/2024)  Stress: No Stress Concern Present (04/15/2024)  Tobacco Use: Low Risk  (04/15/2024)  Health Literacy: Adequate Health Literacy (04/15/2024)  See flowsheets for full screening details  Depression Screen PHQ 2 & 9 Depression Scale- Over the past 2 weeks, how often have you been bothered by any of the following problems? Little interest or pleasure in doing things: 0 Feeling down, depressed, or hopeless (PHQ Adolescent also includes...irritable): 0 PHQ-2 Total Score: 0 Trouble falling or staying asleep, or sleeping too much: 1 (gets 6-7hrs of sleep) Feeling tired or having little energy: 0 Poor appetite or overeating (PHQ Adolescent also includes...weight loss): 0 Feeling bad about yourself - or that you are a failure or have let yourself or your family down: 0 Trouble concentrating on things,  such as reading the newspaper or watching television (PHQ Adolescent also includes...like school work): 0 Moving or speaking so slowly that other people could have noticed. Or the opposite - being so fidgety or restless that you have been moving around a lot more than usual: 0 Thoughts that you would be better off dead, or of hurting yourself in some way: 0 PHQ-9 Total Score: 1 If you checked off any problems, how difficult have these problems made it for you to do your work, take care of things at home, or get along with other people?: Not difficult at all  Depression Treatment Depression Interventions/Treatment : [id number] Score <4 Follow-up Not Indicated     Goals Addressed               This Visit's Progress     Patient Stated (pt-stated)        Patient stated she plans to continue preventing falls             Objective:    Today's Vitals   04/15/24 1316  Weight: 157 lb (71.2 kg)  Height: 5' 4 (1.626 m)   Body mass index is 26.95 kg/m.  Hearing/Vision screen Hearing Screening - Comments:: Denies hearing difficulties   Vision Screening - Comments:: Denies vision concerns  Immunizations and Health Maintenance Health Maintenance  Topic Date Due   Hepatitis C Screening  Never done   DTaP/Tdap/Td (1 - Tdap) Never done   Zoster Vaccines- Shingrix (1 of 2) Never done   Bone Density Scan  Never done   Pneumococcal Vaccine: 50+ Years (2 of 2 - PCV) 07/22/2021   Mammogram  02/14/2023   Influenza Vaccine  12/06/2023   COVID-19 Vaccine (4 - 2025-26 season) 01/06/2024   Medicare Annual Wellness (AWV)  04/15/2025   Meningococcal B Vaccine  Aged Out        Assessment/Plan:  This is a routine wellness examination for Robin Valencia.  Patient Care Team: Rollene Almarie LABOR, MD as PCP - General (Internal Medicine) Josefina Chew, MD as Consulting Physician (Orthopedic Surgery) Leila, Marsa Jasper, MD as Consulting Physician (Psychiatry)  I have personally reviewed and noted  the following in the patients chart:   Medical and social history Use of alcohol, tobacco or illicit drugs  Current medications and supplements including opioid prescriptions. Functional ability and status Nutritional status Physical activity Advanced directives List of other physicians Hospitalizations, surgeries, and ER visits in previous 12 months Vitals Screenings to include cognitive, depression, and falls Referrals and appointments  Orders Placed This Encounter  Procedures   DG Bone Density    Standing Status:   Future    Expiration Date:   04/15/2025    Reason for Exam (SYMPTOM  OR DIAGNOSIS REQUIRED):   postmenopausal estrogen deficiency    Preferred imaging location?:   [facility]   MM 3D SCREENING MAMMOGRAM BILATERAL  BREAST    Preferably at the same time with DEXA scan appt    Standing Status:   Future    Expiration Date:   04/15/2025    Reason for Exam (SYMPTOM  OR DIAGNOSIS REQUIRED):   screening for breast cancer    Preferred imaging location?:   [facility]   In addition, I have reviewed and discussed with patient certain preventive protocols, quality metrics, and best practice recommendations. A written personalized care plan for preventive services as well as general preventive health recommendations were provided to patient.   Verdie CHRISTELLA Saba, CMA   04/15/2024   Return in 1 year (on 04/15/2025).  After Visit Summary: (MyChart) Due to this being a telephonic visit, the after visit summary with patients personalized plan was offered to patient via MyChart   Nurse Notes: Appointment(s) made: (Scheduled 1-yr CPE appt w/PCP for 05/2024 (also address falls.)

## 2024-05-19 ENCOUNTER — Ambulatory Visit: Admitting: Internal Medicine

## 2025-04-16 ENCOUNTER — Ambulatory Visit
